# Patient Record
Sex: Male | Born: 1975 | Race: Black or African American | Hispanic: No | Smoking: Never smoker
Health system: Southern US, Community
[De-identification: ages and names within clinical notes are randomized; demographics above are authoritative.]

## PROBLEM LIST (undated history)

## (undated) DIAGNOSIS — K3184 Gastroparesis: Secondary | ICD-10-CM

## (undated) HISTORY — PX: CHOLECYSTECTOMY: SHX55

---

## 2003-01-23 ENCOUNTER — Encounter: Payer: Self-pay | Admitting: Emergency Medicine

## 2003-01-23 ENCOUNTER — Emergency Department (HOSPITAL_COMMUNITY): Admission: EM | Admit: 2003-01-23 | Discharge: 2003-01-23 | Payer: Self-pay | Admitting: Emergency Medicine

## 2005-03-17 ENCOUNTER — Emergency Department (HOSPITAL_COMMUNITY): Admission: EM | Admit: 2005-03-17 | Discharge: 2005-03-17 | Payer: Self-pay | Admitting: Emergency Medicine

## 2005-03-29 ENCOUNTER — Encounter: Admission: RE | Admit: 2005-03-29 | Discharge: 2005-04-15 | Payer: Self-pay | Admitting: Internal Medicine

## 2006-11-28 ENCOUNTER — Emergency Department (HOSPITAL_COMMUNITY): Admission: EM | Admit: 2006-11-28 | Discharge: 2006-11-28 | Payer: Self-pay | Admitting: Emergency Medicine

## 2010-10-23 NOTE — Consult Note (Signed)
NAME:  Carlos [name], Carlos [name]                  ACCOUNT NO.:  [id number]   MEDICAL RECORD NO.:  [id number]          PATIENT TYPE:  EMS   LOCATION:  MINO                         FACILITY:  [facility]   PHYSICIAN:  Rose Phi. Myna Hidalgo, M.D. DATE OF BIRTH:  06-28-1975   DATE OF CONSULTATION:  03/17/2005  DATE OF DISCHARGE:  03/17/2005                                   CONSULTATION   REASON FOR CONSULTATION:  Metastatic adenocarcinoma of lung primary.   HISTORY OF PRESENT ILLNESS:  Carlos [name] is a very nice 35 year old gentleman  who initially was admitted on March 01, 2005 for cervical diskectomy. He  was admitted by Dr. Otelia Sergeant. He underwent diskectomy from C4 through C7. He  had fusion with bone grafts. There was some internal fixation with cervical  plate and screws. Postoperatively, he had problems with dysphagia and left  shoulder weakness. He was seen by Dr. Sharene Skeans of neurology. He did have an  MRI. MRI was done of the brain, which was negative for any CNS lesions.  However, there was an abnormal appearance at the T2 vertebral body. There  was some thin epidural enhancement. Also noted was probable involvement of  T1 and T3. MRI of the left shoulder was done, which showed an endosteal  lesion of the proximal humerus. He did have a chest x-ray done on March 10, 2005, which showed prominence in the right hilum. There was some density in  the right upper lobe. A CT of the chest was done, which showed multiple  foreign nodules and masses. Largest mass in the right lower lobe measured  2.6 cm. There was also right hilar adenopathy. He subsequently underwent a  CT biopsy of a lung nodule. This unfortunately came back as adenocarcinoma.  The pathology report was [id number]. Stains were done, which showed the  staining pattern to support a lung primary. We were asked to see Carlos [name]  for evaluation. Of note, a bone scan was done which showed diffuse  metastatic disease of the skeleton. There was  involvement of the bilateral  femur and right humerus. He has lost quite a bit of weight. He has a Panda  tube in because of severe dysphagia. He is starting to swallow a little bit  better. He has no problems with bleeding. There is no cough or increased  shortness of breath.   PAST MEDICAL HISTORY:  1.  Hypertension.  2.  Chronic obstructive pulmonary disease.  3.  Anxiety/depression.   ALLERGIES:  None.   CURRENT MEDICATIONS:  Maxzide 37.5/25 1 p.o. q.d., Zyprexa 2.5 mg q.d.,  Prozac 20 mg q.d., Xanax 0.5 mg q.d., albuterol nebulizer 2.5 mg q.i.d.,  Protonix 40 mg q.d., Wellbutrin 150 mg p.o. q.d., Atrovent inhaler 0.5 mg  q.i.d., Reglan 10 mg p.o. q. 6 hours.   SOCIAL HISTORY:  Remarkable for significant tobacco use. He had worked at  [employer].   FAMILY HISTORY:  Noncontributory.   REVIEW OF SYSTEMS:  Is as stated in the history of present illness.   PHYSICAL EXAMINATION:  GENERAL:  This is a thin white  gentleman in no  obvious distress.  VITAL SIGNS:  Temperature 96.2, pulse 92, respiratory rate 24, blood  pressure 136/84.  HEENT:  Normocephalic and atraumatic skull. There is some slight temporal  muscle wasting. He has no oral lesions. He has a cervical collar on.  However, I could not palpate any obvious adenopathy.  LUNGS:  Some decreased breath sounds throughout both lung fields.  CARDIAC:  Regular rate and rhythm. Normal S1 and S2. No murmurs, rubs, or  bruits.  ABDOMEN:  Soft with good bowel sounds. There is no palpable abdominal mass.  There is no palpable hepatosplenomegaly.  EXTREMITIES:  Shows muscle wasting upper and lower extremities bilaterally.  NEUROLOGIC:  No focal neurological deficits.   LABORATORY DATA:  White blood cell count is 15.4. Hemoglobin and hematocrit  13.9 and 41.7. Platelet count 512,000. Sodium is 138, potassium 4.4, BUN 21,  creatinine 0.8, calcium 9.5. He does have a monoclonal IgG kappa protein  present. He has slightly  depressed total IgG level. He has a monoclonal  protein of 0.13 grams. His PSA is 1.4.   IMPRESSION/PLAN:  Carlos [name] is a 35 year old gentleman with stage 4  adenocarcinoma of the lung. He certainly is a candidate for chemotherapy.  His performance status is adequate  - ECOG (we certainly need to be careful  with our chemotherapy. I think he could tolerate carboplatin based therapy  but not cisplatin based therapy). I need to speak with radiation oncology to  make sure that he is not supine. His disease certainly is somewhat on the  aggressive side. I think his chance of responding is about 25% or so. I  think that carboplatin/Taxotere would be appropriate therapy for him. I  would not add Avastin, as I am not convinced that this provides that much  more benefit to him. If he does decide to proceed with chemotherapy this  admission, we can certainly move him over to [facility].  He may be interested in a second opinion. I told he and his wife that I  would prefer that he go to [facility], as I believe they have a very active lung  cancer program. Overall, I think the outlook for Carlos [name] is not going to  be too favorable. His weight loss is certainly an ominous prognostic factor.  His thrombocytosis also is a factor. We will certainly follow Carlos [name] as  we can.      Rose Phi. Myna Hidalgo, M.D.  Electronically Signed     PRE/MEDQ  D:  03/17/2005  T:  03/17/2005  Job:  [id number]   cc:   Deanna Artis. Sharene Skeans, M.D.  Fax: [phone]   Ladell Pier, M.D.  Fax: [phone]

## 2010-10-29 ENCOUNTER — Emergency Department (HOSPITAL_COMMUNITY)
Admission: EM | Admit: 2010-10-29 | Discharge: 2010-10-29 | Disposition: A | Discharge status: Home / Self Care | Payer: Self-pay | Attending: Emergency Medicine | Admitting: Emergency Medicine

## 2010-10-29 DIAGNOSIS — I10 Essential (primary) hypertension: Secondary | ICD-10-CM | POA: Insufficient documentation

## 2010-10-29 DIAGNOSIS — E119 Type 2 diabetes mellitus without complications: Secondary | ICD-10-CM | POA: Insufficient documentation

## 2010-10-29 DIAGNOSIS — L03319 Cellulitis of trunk, unspecified: Secondary | ICD-10-CM | POA: Insufficient documentation

## 2010-10-29 DIAGNOSIS — L02219 Cutaneous abscess of trunk, unspecified: Secondary | ICD-10-CM | POA: Insufficient documentation

## 2011-08-08 ENCOUNTER — Emergency Department (HOSPITAL_COMMUNITY)
Admission: EM | Admit: 2011-08-08 | Discharge: 2011-08-09 | Disposition: A | Discharge status: Home Health | Payer: BC Managed Care – PPO | Attending: Emergency Medicine | Admitting: Emergency Medicine

## 2011-08-08 ENCOUNTER — Encounter (HOSPITAL_COMMUNITY): Payer: Self-pay | Admitting: *Deleted

## 2011-08-08 ENCOUNTER — Other Ambulatory Visit: Payer: Self-pay

## 2011-08-08 DIAGNOSIS — M79609 Pain in unspecified limb: Secondary | ICD-10-CM | POA: Insufficient documentation

## 2011-08-08 DIAGNOSIS — M436 Torticollis: Secondary | ICD-10-CM

## 2011-08-08 DIAGNOSIS — Z79899 Other long term (current) drug therapy: Secondary | ICD-10-CM | POA: Insufficient documentation

## 2011-08-08 DIAGNOSIS — M542 Cervicalgia: Secondary | ICD-10-CM | POA: Insufficient documentation

## 2011-08-08 DIAGNOSIS — E119 Type 2 diabetes mellitus without complications: Secondary | ICD-10-CM | POA: Insufficient documentation

## 2011-08-08 DIAGNOSIS — M62838 Other muscle spasm: Secondary | ICD-10-CM | POA: Insufficient documentation

## 2011-08-08 MED ORDER — IBUPROFEN 800 MG PO TABS: 800.0000 mg | ORAL_TABLET | Freq: Three times a day (TID) | ORAL | Status: AC

## 2011-08-08 MED ORDER — KETOROLAC TROMETHAMINE 60 MG/2ML IM SOLN
60.0000 mg | Freq: Once | INTRAMUSCULAR | Status: AC
  Administered 2011-08-08: 60 mg via INTRAMUSCULAR
  Filled 2011-08-08: qty 2

## 2011-08-08 MED ORDER — DIAZEPAM 5 MG PO TABS: 5.0000 mg | ORAL_TABLET | Freq: Two times a day (BID) | ORAL | Status: AC

## 2011-08-08 MED ORDER — DIAZEPAM 5 MG PO TABS
5.0000 mg | ORAL_TABLET | Freq: Once | ORAL | Status: AC
  Administered 2011-08-08: 5 mg via ORAL
  Filled 2011-08-08: qty 1

## 2011-08-08 NOTE — ED Notes (Signed)
Pt presents with no acute distress- c/oof left sided neck pain x 1 month.  Sharp pain to left arm started to day- denies injury

## 2011-08-08 NOTE — Discharge Instructions (Signed)
Your neck pain is most likely related to muscle spasm. You've been given medication here to treat this. You'll additionally been given prescriptions for ibuprofen and a muscle rupture. Please take the ibuprofen as prescribed for at least the next 3 days. Return to the ER with worsening condition, pain, fever, rash, or any other worrisome symptoms.  Torticollis, Acute You have suddenly (acutely) developed a twisted neck (torticollis). This is usually a self-limited condition. CAUSES  Acute torticollis may be caused by malposition, trauma or infection. Most commonly, acute torticollis is caused by sleeping in an awkward position. Torticollis may also be caused by the flexion, extension or twisting of the neck muscles beyond their normal position. Sometimes, the exact cause may not be known. SYMPTOMS  Usually, there is pain and limited movement of the neck. Your neck may twist to one side. DIAGNOSIS  The diagnosis is often made by physical examination. X-rays, CT scans or MRIs may be done if there is a history of trauma or concern of infection. TREATMENT  For a common, stiff neck that develops during sleep, treatment is focused on relaxing the contracted neck muscle. Medications (including shots) may be used to treat the problem. Most cases resolve in several days. Torticollis usually responds to conservative physical therapy. If left untreated, the shortened and spastic neck muscle can cause deformities in the face and neck. Rarely, surgery is required. HOME CARE INSTRUCTIONS   Use over-the-counter and prescription medications as directed by your caregiver.   Do stretching exercises and massage the neck as directed by your caregiver.   Follow up with physical therapy if needed and as directed by your caregiver.  SEEK IMMEDIATE MEDICAL CARE IF:   You develop difficulty breathing or noisy breathing (stridor).   You drool, develop trouble swallowing or have pain with swallowing.   You develop  numbness or weakness in the hands or feet.   You have changes in speech or vision.   You have problems with urination or bowel movements.   You have difficulty walking.   You have a fever.   You have increased pain.  MAKE SURE YOU:   Understand these instructions.   Will watch your condition.   Will get help right away if you are not doing well or get worse.  Document Released: 05/21/2000 Document Revised: 05/13/2011 Document Reviewed: 07/02/2009 [facility] Patient Information 2012 [place], [place].

## 2011-08-09 NOTE — ED Provider Notes (Signed)
History     CSN: [id number]  Arrival date & time 08/08/11  2308   First MD Initiated Contact with Patient 08/08/11 2340      Chief Complaint  Patient presents with  . Neck Pain  . Arm Pain    (Consider location/radiation/quality/duration/timing/severity/associated sxs/prior treatment) Patient is a 36 y.o. male presenting with neck pain and arm pain. The history is provided by the patient.  Neck Pain   Arm Pain Associated symptoms include neck pain.  36 year old male with past medical history of diabetes presents with left-sided neck pain. He states that he awoke from sleep with this this morning. Pain is described as sharp and stabbing when the neck is flexed toward the opposite shoulder. He has had occasional burning pain in the area, which concerned him and prompted him to come to the ED for further evaluation. States the pain does not radiate into his shoulder down his arm. He denies any associated chest pain or shortness of breath or diaphoresis. Denies nausea, vomiting. Has never had anything like this before. No known trauma to the area or recent change in activity. Denies headache, numbness, weakness in the extremities, facial pain or weakness.  Past Medical History  Diagnosis Date  . Diabetes mellitus     History reviewed. No pertinent past surgical history.  No family history on file.  History  Substance Use Topics  . Smoking status: Never Smoker   . Smokeless tobacco: Not on file  . Alcohol Use: Yes      Review of Systems  HENT: Positive for neck pain.   All other systems reviewed and are negative.    Allergies  Review of patient's allergies indicates no known allergies.  Home Medications   Current Outpatient Rx  Name Route Sig Dispense Refill  . DIAZEPAM 5 MG PO TABS Oral Take 1 tablet (5 mg total) by mouth 2 (two) times daily. 10 tablet 0  . IBUPROFEN 800 MG PO TABS Oral Take 1 tablet (800 mg total) by mouth 3 (three) times daily. 21 tablet 0    BP  149/95  Pulse 84  Temp(Src) 98 F (36.7 C) (Oral)  Resp 20  SpO2 98%  Physical Exam  Nursing note and vitals reviewed. Constitutional: He is oriented to person, place, and time. He appears well-developed and well-nourished. No distress.  Neck: Neck supple. Muscular tenderness present. No spinous process tenderness present. No rigidity. Decreased range of motion present. No edema and no erythema present. No Brudzinski's sign and no Kernig's sign noted.         Palpable muscle spasm to L lateral neck. Pain worsens when flexing neck toward R shoulder.  Cardiovascular: Normal rate, regular rhythm and normal heart sounds.  Exam reveals no gallop and no friction rub.   No murmur heard. Pulmonary/Chest: Effort normal and breath sounds normal. He exhibits no tenderness.  Abdominal: Soft. There is no tenderness.  Musculoskeletal:       Grip strength equal bilaterally. FROM in all ext.  Neurological: He is alert and oriented to person, place, and time.  Skin: Skin is warm and dry. He is not diaphoretic.  Psychiatric: He has a normal mood and affect.    ED Course  Procedures (including critical care time)  Labs Reviewed - No data to display No results found.   1. Torticollis       MDM  35yo M with pain to L lateral neck which started this morning when he woke up - palpable muscle spasm and  tenderness on exam - will tx as torticollis. I don't have suspicion for atypical CP based on tenderness on exam and symptoms. No evidence of rash. No evidence for meningismus. Return precautions discussed.         Grant Fontana, [place] 08/09/11 [phone]

## 2011-08-09 NOTE — ED Provider Notes (Signed)
Medical screening examination/treatment/procedure(s) were performed by non-physician practitioner and as supervising physician I was immediately available for consultation/collaboration.  ED ECG REPORT   Date: 08/09/2011   Rate: 83  Rhythm: normal sinus rhythm  QRS Axis: normal  Intervals: normal  ST/T Wave abnormalities: normal  Conduction Disutrbances:none  Narrative Interpretation:   Old EKG Reviewed: none available    Vida Roller, MD 08/09/11 [phone]

## 2013-06-29 ENCOUNTER — Ambulatory Visit: Payer: BC Managed Care – PPO

## 2015-02-28 ENCOUNTER — Encounter (HOSPITAL_COMMUNITY): Payer: Self-pay | Admitting: *Deleted

## 2015-02-28 ENCOUNTER — Emergency Department (HOSPITAL_COMMUNITY)
Admission: EM | Admit: 2015-02-28 | Discharge: 2015-02-28 | Disposition: A | Discharge status: Home / Self Care | Payer: 59 | Attending: Emergency Medicine | Admitting: Emergency Medicine

## 2015-02-28 DIAGNOSIS — Z79899 Other long term (current) drug therapy: Secondary | ICD-10-CM | POA: Diagnosis not present

## 2015-02-28 DIAGNOSIS — E119 Type 2 diabetes mellitus without complications: Secondary | ICD-10-CM | POA: Insufficient documentation

## 2015-02-28 DIAGNOSIS — L0291 Cutaneous abscess, unspecified: Secondary | ICD-10-CM

## 2015-02-28 DIAGNOSIS — L02416 Cutaneous abscess of left lower limb: Secondary | ICD-10-CM | POA: Diagnosis not present

## 2015-02-28 LAB — CBG MONITORING, ED: Glucose-Capillary: 307 mg/dL — ABNORMAL HIGH (ref 65–99)

## 2015-02-28 MED ORDER — SULFAMETHOXAZOLE-TRIMETHOPRIM 800-160 MG PO TABS
1.0000 | ORAL_TABLET | Freq: Once | ORAL | Status: AC
  Administered 2015-02-28: 1 via ORAL
  Filled 2015-02-28: qty 1

## 2015-02-28 MED ORDER — LIDOCAINE-EPINEPHRINE (PF) 2 %-1:200000 IJ SOLN
20.0000 mL | Freq: Once | INTRAMUSCULAR | Status: DC
  Filled 2015-02-28: qty 20

## 2015-02-28 MED ORDER — SULFAMETHOXAZOLE-TRIMETHOPRIM 800-160 MG PO TABS
1.0000 | ORAL_TABLET | Freq: Two times a day (BID) | ORAL | Status: AC
Start: 2015-02-28 — End: 2015-03-07

## 2015-02-28 NOTE — Progress Notes (Signed)
Patient listed as not having insurance or a pcp.  Patient confirms his pcp is located at [facility]  [phone].  System updated.

## 2015-02-28 NOTE — ED Notes (Signed)
Pt states that he has a wound on his left leg that began last week; pt states that he thought it was a spider bite; pt states that he has been trying to treat it at home and it has gotten worse; pt with quarter sized red open area to left calf

## 2015-02-28 NOTE — ED Notes (Signed)
CBG 307 

## 2015-02-28 NOTE — Discharge Instructions (Signed)

## 2015-02-28 NOTE — ED Provider Notes (Signed)
CSN: [id number]     Arrival date & time 02/28/15  2132 History   First MD Initiated Contact with Patient 02/28/15 2145     Chief Complaint  Patient presents with  . Abscess      HPI Patient ports abscess and drainage of his left lower extremity.  He states several days ago he developed an area of swelling with spreading redness.  His girlfriend incised the abscess and pus was expressed.  He continues having discomfort and pain and erythema and thus presents to the emergency department for evaluation.  No fevers or chills.  He is a diabetic.  Is compliant with his medications.  No known trauma to his leg.   Past Medical History  Diagnosis Date  . Diabetes mellitus    History reviewed. No pertinent past surgical history. No family history on file. Social History  Substance Use Topics  . Smoking status: Never Smoker   . Smokeless tobacco: None  . Alcohol Use: Yes     Comment: rarely    Review of Systems  All other systems reviewed and are negative.     Allergies  Review of patient's allergies indicates no known allergies.  Home Medications   Prior to Admission medications   Medication Sig Start Date End Date Taking? Authorizing Provider  metFORMIN (GLUCOPHAGE) 500 MG tablet Take 500 mg by mouth 2 (two) times daily with a meal.   Yes Historical Provider, MD  sulfamethoxazole-trimethoprim (BACTRIM DS,SEPTRA DS) 800-160 MG per tablet Take 1 tablet by mouth 2 (two) times daily. 02/28/15 03/07/15  Azalia Bilis, MD   BP 138/90 mmHg  Pulse 62  Temp(Src) 98.8 F (37.1 C) (Oral)  Resp 18  SpO2 97% Physical Exam  Constitutional: He is oriented to person, place, and time. He appears well-developed and well-nourished.  HENT:  Head: Normocephalic.  Eyes: EOM are normal.  Neck: Normal range of motion.  Pulmonary/Chest: Effort normal.  Abdominal: He exhibits no distension.  Musculoskeletal: Normal range of motion.  Left lower extremity abscess with small surrounding erythema of  the left anteromedial mid tibia  Neurological: He is alert and oriented to person, place, and time.  Psychiatric: He has a normal mood and affect.  Nursing note and vitals reviewed.   ED Course  Procedures (including critical care time)  INCISION AND DRAINAGE Performed by: Lyanne Co Consent: Verbal consent obtained. Risks and benefits: risks, benefits and alternatives were discussed Time out performed prior to procedure Type: abscess Body area: left lower extremity Anesthesia: local infiltration Incision was made with a scalpel. Local anesthetic: lidocaine 2% with epinephrine Anesthetic total: 5 ml Complexity: complex Blunt dissection to break up loculations Drainage: purulent Drainage amount: small Packing material: simple Patient tolerance: Patient tolerated the procedure well with no immediate complications.     Labs Review Labs Reviewed  CBG MONITORING, ED - Abnormal; Notable for the following:    Glucose-Capillary 307 (*)    All other components within normal limits    Imaging Review No results found. I have personally reviewed and evaluated these images and lab results as part of my medical decision-making.   EKG Interpretation None      MDM   Final diagnoses:  Abscess    Incision and drainage of abscess.  History of diabetes.  Patient be started on antibiotics.  Small surrounding cellulitis.  Infection warnings given.    Azalia Bilis, MD 02/28/15 [phone]

## 2015-05-01 ENCOUNTER — Emergency Department (HOSPITAL_COMMUNITY)
Admission: EM | Admit: 2015-05-01 | Discharge: 2015-05-01 | Disposition: A | Discharge status: Home / Self Care | Payer: 59 | Attending: Emergency Medicine | Admitting: Emergency Medicine

## 2015-05-01 ENCOUNTER — Encounter (HOSPITAL_COMMUNITY): Payer: Self-pay | Admitting: Emergency Medicine

## 2015-05-01 DIAGNOSIS — L02821 Furuncle of head [any part, except face]: Secondary | ICD-10-CM | POA: Insufficient documentation

## 2015-05-01 DIAGNOSIS — L0292 Furuncle, unspecified: Secondary | ICD-10-CM

## 2015-05-01 DIAGNOSIS — R220 Localized swelling, mass and lump, head: Secondary | ICD-10-CM | POA: Diagnosis present

## 2015-05-01 DIAGNOSIS — E119 Type 2 diabetes mellitus without complications: Secondary | ICD-10-CM | POA: Insufficient documentation

## 2015-05-01 DIAGNOSIS — Z79899 Other long term (current) drug therapy: Secondary | ICD-10-CM | POA: Insufficient documentation

## 2015-05-01 MED ORDER — DOXYCYCLINE HYCLATE 100 MG PO CAPS: 100.0000 mg | ORAL_CAPSULE | Freq: Two times a day (BID) | ORAL | Status: DC

## 2015-05-01 NOTE — Discharge Instructions (Signed)

## 2015-05-01 NOTE — ED Notes (Signed)
Pt c/o moveable mass to posterior neck onset one week ago, c/o headache onset after mass. Mild scabbing to exterior of mass, no signs of drainage, pt denies drainage. Pt reports hx of the same, states dx was boil with previous mass. Pt denies other symptoms.

## 2015-05-01 NOTE — ED Provider Notes (Signed)
CSN: [id number]     Arrival date & time 05/01/15  [id number] History   First MD Initiated Contact with Patient 05/01/15 [phone]     Chief Complaint  Patient presents with  . Mass     (Consider location/radiation/quality/duration/timing/severity/associated sxs/prior Treatment) HPI Comments: Pt here with possible abscess to occipital scalp x 1 week No fever or chills No drainage H/o same Nothing makes sx better or worse, no tx used pta  The history is provided by the patient.    Past Medical History  Diagnosis Date  . Diabetes mellitus    History reviewed. No pertinent past surgical history. History reviewed. No pertinent family history. Social History  Substance Use Topics  . Smoking status: Never Smoker   . Smokeless tobacco: None  . Alcohol Use: Yes     Comment: rarely    Review of Systems  All other systems reviewed and are negative.     Allergies  Review of patient's allergies indicates no known allergies.  Home Medications   Prior to Admission medications   Medication Sig Start Date End Date Taking? Authorizing Provider  doxycycline (VIBRAMYCIN) 100 MG capsule Take 1 capsule (100 mg total) by mouth 2 (two) times daily. 05/01/15   Lorre NickAnthony Brisia Schuermann, MD  metFORMIN (GLUCOPHAGE) 500 MG tablet Take 500 mg by mouth 2 (two) times daily with a meal.    Historical Provider, MD   BP 146/86 mmHg  Pulse 97  Temp(Src) 97.7 F (36.5 C) (Oral)  Resp 18  SpO2 100% Physical Exam  Constitutional: He is oriented to person, place, and time. He appears well-developed and well-nourished.  Non-toxic appearance. No distress.  HENT:  Head: Normocephalic and atraumatic.    Eyes: Conjunctivae, EOM and lids are normal. Pupils are equal, round, and reactive to light.  Neck: Normal range of motion. Neck supple. No tracheal deviation present. No thyroid mass present.  Cardiovascular: Normal rate, regular rhythm and normal heart sounds.  Exam reveals no gallop.   No murmur  heard. Pulmonary/Chest: Effort normal and breath sounds normal. No stridor. No respiratory distress. He has no decreased breath sounds. He has no wheezes. He has no rhonchi. He has no rales.  Abdominal: Soft. Normal appearance and bowel sounds are normal. He exhibits no distension. There is no tenderness. There is no rebound and no CVA tenderness.  Musculoskeletal: Normal range of motion. He exhibits no edema or tenderness.  Neurological: He is alert and oriented to person, place, and time. He has normal strength. No cranial nerve deficit or sensory deficit. GCS eye subscore is 4. GCS verbal subscore is 5. GCS motor subscore is 6.  Skin: Skin is warm and dry. No abrasion and no rash noted.  Psychiatric: He has a normal mood and affect. His speech is normal and behavior is normal.  Nursing note and vitals reviewed.   ED Course  Procedures (including critical care time) Labs Review Labs Reviewed - No data to display  Imaging Review No results found. I have personally reviewed and evaluated these images and lab results as part of my medical decision-making.   EKG Interpretation None      MDM   Final diagnoses:  Boil   No drainable abscess, possible early cellulitis, will tx with doxy    Lorre NickAnthony Joanthan Hlavacek, MD 05/01/15 [phone]

## 2021-04-23 ENCOUNTER — Other Ambulatory Visit: Payer: Self-pay

## 2021-04-23 ENCOUNTER — Encounter (HOSPITAL_COMMUNITY): Payer: Self-pay

## 2021-04-23 ENCOUNTER — Emergency Department (HOSPITAL_COMMUNITY): Payer: 59

## 2021-04-23 ENCOUNTER — Emergency Department (HOSPITAL_COMMUNITY)
Admission: EM | Admit: 2021-04-23 | Discharge: 2021-04-23 | Disposition: A | Discharge status: Home / Self Care | Payer: 59 | Attending: Emergency Medicine | Admitting: Emergency Medicine

## 2021-04-23 DIAGNOSIS — E119 Type 2 diabetes mellitus without complications: Secondary | ICD-10-CM | POA: Insufficient documentation

## 2021-04-23 DIAGNOSIS — Z7984 Long term (current) use of oral hypoglycemic drugs: Secondary | ICD-10-CM | POA: Insufficient documentation

## 2021-04-23 DIAGNOSIS — R0602 Shortness of breath: Secondary | ICD-10-CM | POA: Insufficient documentation

## 2021-04-23 DIAGNOSIS — R112 Nausea with vomiting, unspecified: Secondary | ICD-10-CM | POA: Diagnosis not present

## 2021-04-23 DIAGNOSIS — K1379 Other lesions of oral mucosa: Secondary | ICD-10-CM

## 2021-04-23 DIAGNOSIS — R609 Edema, unspecified: Secondary | ICD-10-CM | POA: Diagnosis not present

## 2021-04-23 DIAGNOSIS — R059 Cough, unspecified: Secondary | ICD-10-CM | POA: Insufficient documentation

## 2021-04-23 DIAGNOSIS — R000 Tachycardia, unspecified: Secondary | ICD-10-CM | POA: Insufficient documentation

## 2021-04-23 LAB — CBC WITH DIFFERENTIAL/PLATELET
Abs Immature Granulocytes: 0.04 10*3/uL (ref 0.00–0.07)
Basophils Absolute: 0 10*3/uL (ref 0.0–0.1)
Basophils Relative: 0 %
Eosinophils Absolute: 0 10*3/uL (ref 0.0–0.5)
Eosinophils Relative: 0 %
HCT: 44.6 % (ref 39.0–52.0)
Hemoglobin: 15.4 g/dL (ref 13.0–17.0)
Immature Granulocytes: 1 %
Lymphocytes Relative: 21 %
Lymphs Abs: 1.7 10*3/uL (ref 0.7–4.0)
MCH: 30.9 pg (ref 26.0–34.0)
MCHC: 34.5 g/dL (ref 30.0–36.0)
MCV: 89.6 fL (ref 80.0–100.0)
Monocytes Absolute: 0.8 10*3/uL (ref 0.1–1.0)
Monocytes Relative: 9 %
Neutro Abs: 5.7 10*3/uL (ref 1.7–7.7)
Neutrophils Relative %: 69 %
Platelets: 238 10*3/uL (ref 150–400)
RBC: 4.98 MIL/uL (ref 4.22–5.81)
RDW: 12.1 % (ref 11.5–15.5)
WBC: 8.3 10*3/uL (ref 4.0–10.5)
nRBC: 0 % (ref 0.0–0.2)

## 2021-04-23 LAB — COMPREHENSIVE METABOLIC PANEL
ALT: 16 U/L (ref 0–44)
AST: 19 U/L (ref 15–41)
Albumin: 4.4 g/dL (ref 3.5–5.0)
Alkaline Phosphatase: 45 U/L (ref 38–126)
Anion gap: 11 (ref 5–15)
BUN: 21 mg/dL — ABNORMAL HIGH (ref 6–20)
CO2: 29 mmol/L (ref 22–32)
Calcium: 9.3 mg/dL (ref 8.9–10.3)
Chloride: 96 mmol/L — ABNORMAL LOW (ref 98–111)
Creatinine, Ser: 1.24 mg/dL (ref 0.61–1.24)
GFR, Estimated: >60 mL/min (ref >60)
Glucose, Bld: 229 mg/dL — ABNORMAL HIGH (ref 70–99)
Potassium: 4.2 mmol/L (ref 3.5–5.1)
Sodium: 136 mmol/L (ref 135–145)
Total Bilirubin: 1.4 mg/dL — ABNORMAL HIGH (ref 0.3–1.2)
Total Protein: 7.5 g/dL (ref 6.5–8.1)

## 2021-04-23 LAB — LIPASE, BLOOD: Lipase: 21 U/L (ref 11–51)

## 2021-04-23 MED ORDER — IOHEXOL 350 MG/ML SOLN
60.0000 mL | Freq: Once | INTRAVENOUS | Status: AC | PRN
  Administered 2021-04-23: 21:00:00 60 mL via INTRAVENOUS

## 2021-04-23 MED ORDER — SODIUM CHLORIDE 0.9 % IV BOLUS
1000.0000 mL | Freq: Once | INTRAVENOUS | Status: AC
  Administered 2021-04-23: 20:00:00 1000 mL via INTRAVENOUS

## 2021-04-23 NOTE — ED Triage Notes (Signed)
Pt. Arrived POV c/o cough. Pt. States that he coughed up something that feels as if it is stuck in his throat. Denies trouble breathing.

## 2021-04-23 NOTE — Discharge Instructions (Signed)
The back of your mouth looks a little swollen.  Does not look like infection.  Otherwise the scan was reassuring.  Follow-up with your doctor.

## 2021-04-23 NOTE — ED Provider Notes (Signed)
[facility]-EMERGENCY DEPT Provider Note   CSN: [id number] Arrival date & time: 04/23/21  1820     History Chief Complaint  Patient presents with   Cough    Carlos [name] is a 45 y.o. male.   Cough Associated symptoms: shortness of breath   Associated symptoms: no chest pain, no rash and no sore throat   Patient presents with cough and feeling of fullness in his throat.  States has had nausea and vomiting for the last few days.  This is not unusual for him with his gastroparesis.  States he actually feels of his belly is doing a little better now.  However feels as if there is something in his throat.  States when he had coughed or vomited and felt as if something got stuck on the left side of his throat.  States he feels more short of breath if he lays down now.  States he also feels if he could have a urinary tract infection.  States his sugars have been doing pretty well.  States they were around 170.  Feels as if he is little dehydrated.    Past Medical History:  Diagnosis Date   Diabetes mellitus   Gastroparesis.  There are no problems to display for this patient.   History reviewed. No pertinent surgical history.     History reviewed. No pertinent family history.  Social History   Tobacco Use   Smoking status: Never  Substance Use Topics   Alcohol use: Yes    Comment: rarely   Drug use: No    Home Medications Prior to Admission medications   Medication Sig Start Date End Date Taking? Authorizing Provider  doxycycline (VIBRAMYCIN) 100 MG capsule Take 1 capsule (100 mg total) by mouth 2 (two) times daily. 05/01/15   Lorre Nick, MD  metFORMIN (GLUCOPHAGE) 500 MG tablet Take 500 mg by mouth 2 (two) times daily with a meal.    [provider]    Allergies    Patient has no known allergies.  Review of Systems   Review of Systems  Constitutional:  Positive for appetite change.  HENT:  Negative for sore throat and trouble  swallowing.        Pain in left throat and feeling of fullness or swelling.  Respiratory:  Positive for cough and shortness of breath.   Cardiovascular:  Negative for chest pain.  Gastrointestinal:  Positive for nausea and vomiting.  Genitourinary:  Negative for flank pain.  Musculoskeletal:  Negative for back pain.  Skin:  Negative for rash.  Neurological:  Negative for weakness.  Psychiatric/Behavioral:  Negative for confusion.    Physical Exam Updated Vital Signs BP (!) 171/98 (BP Location: Right Arm)   Pulse (!) 109   Temp 98.4 F (36.9 C) (Oral)   Resp 19   Ht 5\' 9"  (1.753 m)   Wt 95.3 kg   SpO2 97%   BMI 31.01 kg/m   Physical Exam Vitals and nursing note reviewed.  HENT:     Head: Atraumatic.     Mouth/Throat:      Comments: Edema of posterior soft palate and uvula.  Also some lateral edema. Eyes:     Pupils: Pupils are equal, round, and reactive to light.  Cardiovascular:     Rate and Rhythm: Tachycardia present.  Pulmonary:     Breath sounds: No wheezing or rhonchi.  Abdominal:     Tenderness: There is no abdominal tenderness.  Musculoskeletal:  General: No tenderness.     Cervical back: Neck supple.  Skin:    General: Skin is warm.     Capillary Refill: Capillary refill takes less than 2 seconds.  Neurological:     Mental Status: He is alert and oriented to person, place, and time.  Psychiatric:        Mood and Affect: Mood normal.    ED Results / Procedures / Treatments   Labs (all labs ordered are listed, but only abnormal results are displayed) Labs Reviewed  COMPREHENSIVE METABOLIC PANEL - Abnormal; Notable for the following components:      Result Value   Chloride 96 (*)    Glucose, Bld 229 (*)    BUN 21 (*)    Total Bilirubin 1.4 (*)    All other components within normal limits  LIPASE, BLOOD  CBC WITH DIFFERENTIAL/PLATELET  URINALYSIS, ROUTINE W REFLEX MICROSCOPIC    EKG None  Radiology CT Soft Tissue Neck W  Contrast  Result Date: 04/23/2021 CLINICAL DATA:  Sore throat EXAM: CT NECK WITH CONTRAST TECHNIQUE: Multidetector CT imaging of the neck was performed using the standard protocol following the bolus administration of intravenous contrast. CONTRAST:  107mL OMNIPAQUE IOHEXOL 350 MG/ML SOLN COMPARISON:  None. FINDINGS: PHARYNX AND LARYNX: The nasopharynx, oropharynx and larynx are normal. Visible portions of the oral cavity, tongue base and floor of mouth are normal. Normal epiglottis, vallecula and pyriform sinuses. The larynx is normal. No retropharyngeal abscess, effusion or lymphadenopathy. SALIVARY GLANDS: Normal parotid, submandibular and sublingual glands. THYROID: Normal. LYMPH NODES: No enlarged or abnormal density lymph nodes. VASCULAR: Major cervical vessels are patent. LIMITED INTRACRANIAL: Normal. VISUALIZED ORBITS: Normal. MASTOIDS AND VISUALIZED PARANASAL SINUSES: No fluid levels or advanced mucosal thickening. No mastoid effusion. SKELETON: No bony spinal canal stenosis. No lytic or blastic lesions. UPPER CHEST: Clear. OTHER: None. IMPRESSION: Normal CT of the neck. Electronically Signed   By: Deatra Robinson M.D.   On: 04/23/2021 21:55   DG Chest Portable 1 View  Result Date: 04/23/2021 CLINICAL DATA:  Cough. EXAM: PORTABLE CHEST 1 VIEW COMPARISON:  None. FINDINGS: The heart size and mediastinal contours are within normal limits. Both lungs are clear. The visualized skeletal structures are unremarkable. IMPRESSION: No active disease. Electronically Signed   By: Elgie Collard M.D.   On: 04/23/2021 20:29    Procedures Procedures   Medications Ordered in ED Medications  sodium chloride 0.9 % bolus 1,000 mL (0 mLs Intravenous Stopped 04/23/21 2254)  iohexol (OMNIPAQUE) 350 MG/ML injection 60 mL (60 mLs Intravenous Contrast Given 04/23/21 2122)    ED Course  I have reviewed the triage vital signs and the nursing notes.  Pertinent labs & imaging results that were available during my  care of the patient were reviewed by me and considered in my medical decision making (see chart for details).    MDM Rules/Calculators/A&P                           Patient with feeling of something in his throat.  Does have some posterior edema.  Appears to be mostly on soft palate and uvula.  CT scan done and read as negative.  No airway obstruction.  Discussed with patient about possible treatment such as steroids for this.  However after we have discussed would not want to worsen either his gastroparesis or his sugars.  Will discharge home and will follow up with PCP as needed.  Infection felt less  likely because Final Clinical Impression(s) / ED Diagnoses Final diagnoses:  Uvular edema    Rx / DC Orders ED Discharge Orders     None        Benjiman Core, MD 04/24/21 0002

## 2021-08-17 ENCOUNTER — Encounter (HOSPITAL_COMMUNITY): Payer: Self-pay

## 2021-08-17 ENCOUNTER — Emergency Department (HOSPITAL_COMMUNITY): Payer: 59

## 2021-08-17 ENCOUNTER — Emergency Department (HOSPITAL_COMMUNITY)
Admission: EM | Admit: 2021-08-17 | Discharge: 2021-08-17 | Disposition: A | Discharge status: Home / Self Care | Payer: 59 | Attending: Emergency Medicine | Admitting: Emergency Medicine

## 2021-08-17 ENCOUNTER — Other Ambulatory Visit: Payer: Self-pay

## 2021-08-17 DIAGNOSIS — R000 Tachycardia, unspecified: Secondary | ICD-10-CM | POA: Diagnosis not present

## 2021-08-17 DIAGNOSIS — E119 Type 2 diabetes mellitus without complications: Secondary | ICD-10-CM | POA: Diagnosis not present

## 2021-08-17 DIAGNOSIS — R112 Nausea with vomiting, unspecified: Secondary | ICD-10-CM | POA: Insufficient documentation

## 2021-08-17 DIAGNOSIS — R1012 Left upper quadrant pain: Secondary | ICD-10-CM | POA: Insufficient documentation

## 2021-08-17 DIAGNOSIS — R079 Chest pain, unspecified: Secondary | ICD-10-CM | POA: Insufficient documentation

## 2021-08-17 LAB — COMPREHENSIVE METABOLIC PANEL
ALT: 15 U/L (ref 0–44)
AST: 17 U/L (ref 15–41)
Albumin: 4.4 g/dL (ref 3.5–5.0)
Alkaline Phosphatase: 42 U/L (ref 38–126)
Anion gap: 12 (ref 5–15)
BUN: 14 mg/dL (ref 6–20)
CO2: 26 mmol/L (ref 22–32)
Calcium: 9.4 mg/dL (ref 8.9–10.3)
Chloride: 98 mmol/L (ref 98–111)
Creatinine, Ser: 0.99 mg/dL (ref 0.61–1.24)
GFR, Estimated: >60 mL/min (ref >60)
Glucose, Bld: 165 mg/dL — ABNORMAL HIGH (ref 70–99)
Potassium: 3.5 mmol/L (ref 3.5–5.1)
Sodium: 136 mmol/L (ref 135–145)
Total Bilirubin: 1.1 mg/dL (ref 0.3–1.2)
Total Protein: 7.4 g/dL (ref 6.5–8.1)

## 2021-08-17 LAB — TROPONIN I (HIGH SENSITIVITY): Troponin I (High Sensitivity): 5 ng/L (ref <18)

## 2021-08-17 LAB — CBC WITH DIFFERENTIAL/PLATELET
Abs Immature Granulocytes: 0.04 10*3/uL (ref 0.00–0.07)
Basophils Absolute: 0 10*3/uL (ref 0.0–0.1)
Basophils Relative: 1 %
Eosinophils Absolute: 0 10*3/uL (ref 0.0–0.5)
Eosinophils Relative: 0 %
HCT: 39.9 % (ref 39.0–52.0)
Hemoglobin: 14.2 g/dL (ref 13.0–17.0)
Immature Granulocytes: 1 %
Lymphocytes Relative: 34 %
Lymphs Abs: 2.1 10*3/uL (ref 0.7–4.0)
MCH: 30.7 pg (ref 26.0–34.0)
MCHC: 35.6 g/dL (ref 30.0–36.0)
MCV: 86.2 fL (ref 80.0–100.0)
Monocytes Absolute: 0.7 10*3/uL (ref 0.1–1.0)
Monocytes Relative: 11 %
Neutro Abs: 3.3 10*3/uL (ref 1.7–7.7)
Neutrophils Relative %: 53 %
Platelets: 286 10*3/uL (ref 150–400)
RBC: 4.63 MIL/uL (ref 4.22–5.81)
RDW: 12.6 % (ref 11.5–15.5)
WBC: 6.1 10*3/uL (ref 4.0–10.5)
nRBC: 0 % (ref 0.0–0.2)

## 2021-08-17 LAB — LIPASE, BLOOD: Lipase: 23 U/L (ref 11–51)

## 2021-08-17 MED ORDER — SODIUM CHLORIDE 0.9 % IV BOLUS
1000.0000 mL | Freq: Once | INTRAVENOUS | Status: AC
  Administered 2021-08-17: 1000 mL via INTRAVENOUS

## 2021-08-17 MED ORDER — FAMOTIDINE IN NACL 20-0.9 MG/50ML-% IV SOLN
20.0000 mg | Freq: Once | INTRAVENOUS | Status: AC
  Administered 2021-08-17: 20 mg via INTRAVENOUS
  Filled 2021-08-17: qty 50

## 2021-08-17 MED ORDER — METOCLOPRAMIDE HCL 5 MG/ML IJ SOLN
10.0000 mg | Freq: Once | INTRAMUSCULAR | Status: DC
  Filled 2021-08-17: qty 2

## 2021-08-17 MED ORDER — HALOPERIDOL LACTATE 5 MG/ML IJ SOLN
5.0000 mg | Freq: Once | INTRAMUSCULAR | Status: AC
  Administered 2021-08-17: 5 mg via INTRAVENOUS
  Filled 2021-08-17: qty 1

## 2021-08-17 MED ORDER — HYDROMORPHONE HCL 1 MG/ML IJ SOLN
1.0000 mg | Freq: Once | INTRAMUSCULAR | Status: AC
  Administered 2021-08-17: 1 mg via INTRAVENOUS
  Filled 2021-08-17: qty 1

## 2021-08-17 NOTE — ED Notes (Signed)
Patient given cup of water.

## 2021-08-17 NOTE — ED Triage Notes (Signed)
Patient c/o abdominal pain for 3 days. Patient states hx of abdominal pain. N/V and chest pains for 3 days. ?

## 2021-08-17 NOTE — Discharge Instructions (Signed)
You were seen today for nausea, vomiting, and abdominal pain. Your workup shows no signs of infection. Unfortunately you are unable to tolerate the majority of medications that I can offer. I recommend follow up with your GI provider for further evaluation. This is likely a worsening of symptoms from your peptic ulcer disease and gastroparesis symptoms. Return to the emergency department if you develop chest pain, shortness of breath, or other life threatening symptoms.  ?

## 2021-08-17 NOTE — ED Notes (Signed)
Urinal at bedside.  

## 2021-08-17 NOTE — ED Provider Notes (Cosign Needed)
[facility]-EMERGENCY DEPT Provider Note   CSN: [id number] Arrival date & time: 08/17/21  0854     History  No chief complaint on file.   Carlos [name] is a 46 y.o. male.  Patient presents to the emergency department complaining of nausea, vomiting, and chest pain for the past three days. Patient states he has been unable to keep anything down since Friday. Patient has taken no medications at home saying that nothing helps. The patient has PMH significant for delayed gastric emptying, gastroparesis, DM, h.pylori, esophageal ulcers. Patient had planned upper GI endoscopy planned for February but had to cancel due to illness.   HPI     Home Medications Prior to Admission medications   Not on File      Allergies    Patient has no known allergies.    Review of Systems   Review of Systems  Constitutional:  Negative for fever.  Respiratory:  Negative for cough, shortness of breath and stridor.   Cardiovascular:  Positive for chest pain.       Described as burning  Gastrointestinal:  Positive for abdominal pain, constipation (Mild), nausea and vomiting. Negative for diarrhea.  Genitourinary:  Negative for dysuria and flank pain.  Skin:  Negative for pallor.   Physical Exam Updated Vital Signs BP (!) 160/96    Pulse (!) 101    Temp 98.1 F (36.7 C) (Oral)    Resp 15    Ht 5\' 9"  (1.753 m)    Wt 88.5 kg    SpO2 99%    BMI 28.80 kg/m  Physical Exam Vitals and nursing note reviewed.  Constitutional:      General: He is in acute distress.  HENT:     Head: Normocephalic and atraumatic.  Eyes:     Conjunctiva/sclera: Conjunctivae normal.  Cardiovascular:     Rate and Rhythm: Regular rhythm. Tachycardia present.     Pulses: Normal pulses.     Heart sounds: Normal heart sounds.  Pulmonary:     Effort: Pulmonary effort is normal.     Breath sounds: Normal breath sounds.  Abdominal:     Palpations: Abdomen is soft.     Tenderness: There is abdominal tenderness  (Mild tenderness to palpation LUQ). There is no right CVA tenderness or left CVA tenderness.  Musculoskeletal:     Cervical back: Normal range of motion.  Skin:    General: Skin is warm and dry.  Neurological:     Mental Status: He is alert.    ED Results / Procedures / Treatments   Labs (all labs ordered are listed, but only abnormal results are displayed) Labs Reviewed  COMPREHENSIVE METABOLIC PANEL - Abnormal; Notable for the following components:      Result Value   Glucose, Bld 165 (*)    All other components within normal limits  CBC WITH DIFFERENTIAL/PLATELET  LIPASE, BLOOD  URINALYSIS, ROUTINE W REFLEX MICROSCOPIC  TROPONIN I (HIGH SENSITIVITY)  TROPONIN I (HIGH SENSITIVITY)    EKG EKG Interpretation  Date/Time:  Monday August 17 2021 09:05:17 EDT Ventricular Rate:  110 PR Interval:  134 QRS Duration: 85 QT Interval:  316 QTC Calculation: 428 R Axis:   75 Text Interpretation: Sinus tachycardia ST elev, probable normal early repol pattern No acute changes No significant change since last tracing Confirmed by [id number] [phone]) on 08/17/2021 1:09:25 PM  Radiology DG Chest 2 View  Result Date: 08/17/2021 CLINICAL DATA:  Abdominal pain for 3D days in a 46 year old male.  EXAM: CHEST - 2 VIEW COMPARISON:  Comparison made with April 23, 2021. FINDINGS: EKG leads project over the chest. Cardiomediastinal contours and hilar structures are normal. Lungs are clear. No sign of effusion. No visible pneumothorax. On limited assessment there is no acute skeletal process. IMPRESSION: No acute cardiopulmonary disease. Electronically Signed   By: Donzetta KohutGeoffrey  Wile M.D.   On: 08/17/2021 10:45    Procedures Procedures    Medications Ordered in ED Medications  metoCLOPramide (REGLAN) injection 10 mg (10 mg Intravenous Not Given 08/17/21 1000)  sodium chloride 0.9 % bolus 1,000 mL (0 mLs Intravenous Stopped 08/17/21 1326)  famotidine (PEPCID) IVPB 20 mg premix (0 mg Intravenous  Stopped 08/17/21 1038)  HYDROmorphone (DILAUDID) injection 1 mg (1 mg Intravenous Given 08/17/21 1019)  haloperidol lactate (HALDOL) injection 5 mg (5 mg Intravenous Given 08/17/21 1324)    ED Course/ Medical Decision Making/ A&P                           Medical Decision Making Amount and/or Complexity of Data Reviewed Labs: ordered. Radiology: ordered.  Risk Prescription drug management.   This patient presents to the ED for concern of nausea, vomiting, and chest pain, this involves an extensive number of treatment options, and is a complaint that carries with it a high risk of complications and morbidity.  The differential diagnosis includes gastroparesis, PUD, gastritis, pneumonia, pancreatitis, ACS, and others   Co morbidities that complicate the patient evaluation  Hx gastroparesis, PUD, severely delayed gastric emptying   Additional history obtained:  External records from outside source obtained and reviewed including Care Everywhere records showing previous CT abdomen pelvis and gastric emptying study. ED note from 01/16/20   Lab Tests:  I Ordered, and personally interpreted labs.  The pertinent results include:  Lipase 23, Troponin 5   Imaging Studies ordered:  I ordered imaging studies including chest x-ray  I independently visualized and interpreted imaging which showed no acute disease I agree with the radiologist interpretation   Cardiac Monitoring:  The patient was maintained on a cardiac monitor.  I personally viewed and interpreted the cardiac monitored which showed an underlying rhythm of: sinus tachycardia   Medicines ordered and prescription drug management:  I ordered medication including reglan and haldol for gastroparesis symptoms, pepcid for PUD symptoms, dilaudid for pain.  Patient refused reglan, refused GI cocktail, refused any nausea medication Reevaluation of the patient after these medicines showed that the patient improved I have reviewed  the patients home medicines and have made adjustments as needed   Test Considered:  CT abdomen/pelvis  The patient's pain improved with pain medication, haldol, and pepcid. The patient does not have concerning findings on his lab workup. Chest x-ray was clear. He has no rebound tenderness, no guarding, no Murphy's sign. My suspicion of pneumonia is very low. Suspicion of gallbladder pathology or appendicitis very low. Troponin 5 and no obvious ischemic changes on EKG. ACS is unlikely. I believe the patient is having exacerbations of his gastroparesis and PUD. I explained to the patient that he was refusing the majority of medications that I could offer him. I do not see an indication for admission at this time. I believe that the best course of action would be follow up with the patient's GI provider. He unfortunately missed the upper GI endoscopy in February due to illness. Discharge home with strict return precautions     Final Clinical Impression(s) / ED Diagnoses Final diagnoses:  Left upper quadrant abdominal pain  Nausea and vomiting, unspecified vomiting type    Rx / DC Orders ED Discharge Orders     None         Darrick Grinder, PA-C 08/17/21 1420

## 2021-09-24 ENCOUNTER — Ambulatory Visit: Payer: 59 | Admitting: Physician Assistant

## 2022-08-01 IMAGING — CT CT NECK W/ CM
4 series · 14 of 33 positions shown, 17 images · IV contrast (omnipaque)
Comparison: None.

CLINICAL DATA: Sore throat

EXAM:
CT NECK WITH CONTRAST
TECHNIQUE: Multidetector CT imaging of the neck was performed using the
standard protocol following the bolus administration of intravenous
contrast.
CONTRAST:  60mL OMNIPAQUE IOHEXOL 350 MG/ML SOLN

[Series 3: axial neck · axial · 0.52mm/px · z∈[+1396,+1574]mm · 5 of 135 slices shown, 7 images]
[im 23/135  soft-tissue]
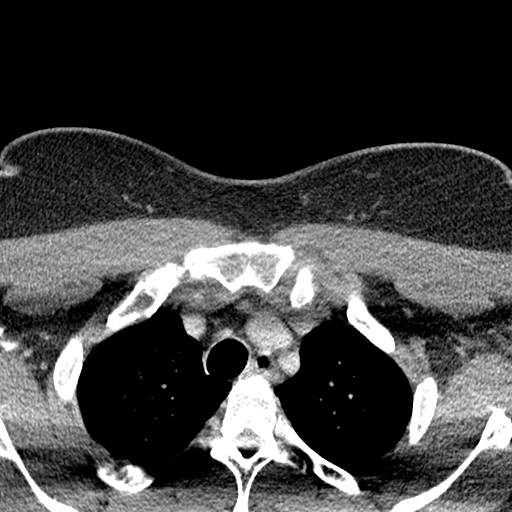
[im 23/135  bone]
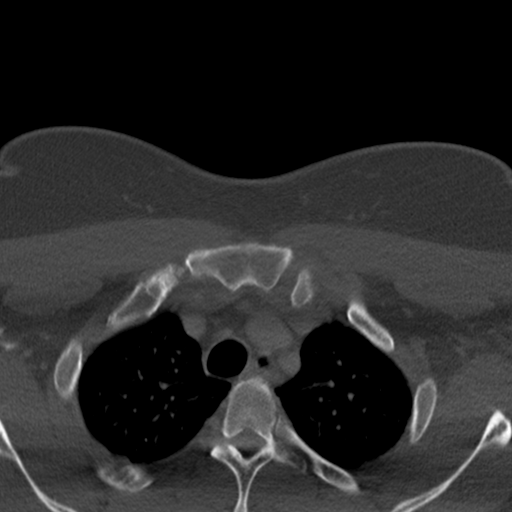
[im 45/135  bone]
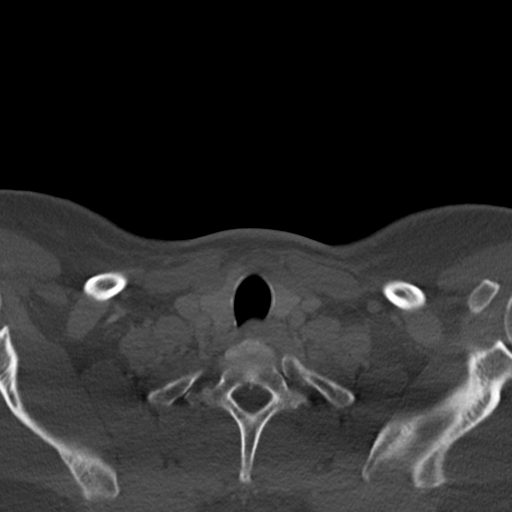
[im 68/135  bone]
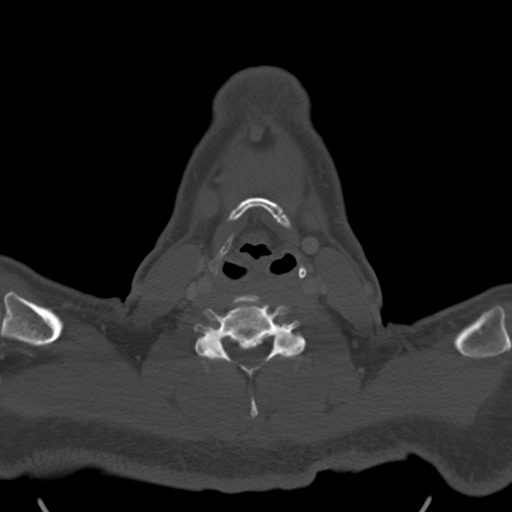
[im 90/135  bone]
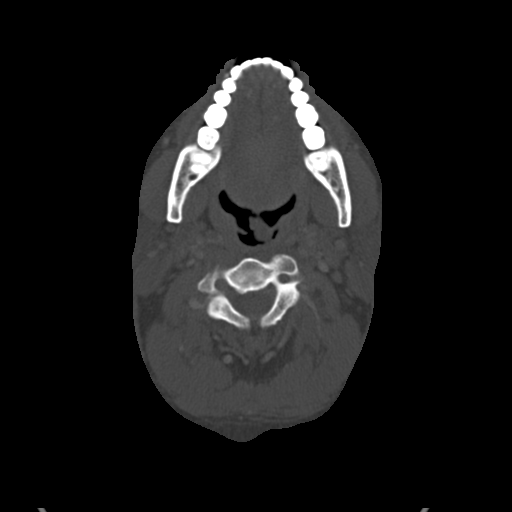
[im 112/135  soft-tissue]
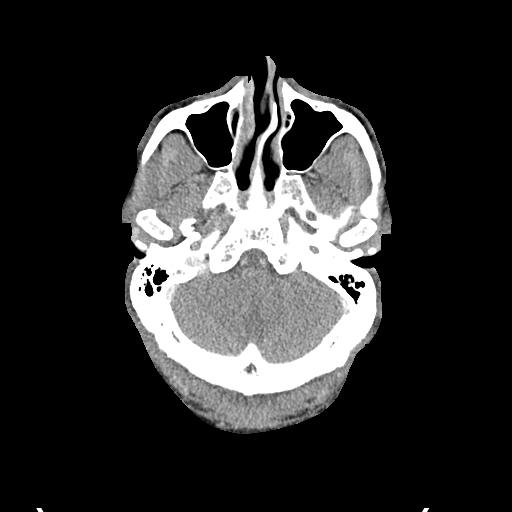
[im 112/135  bone]
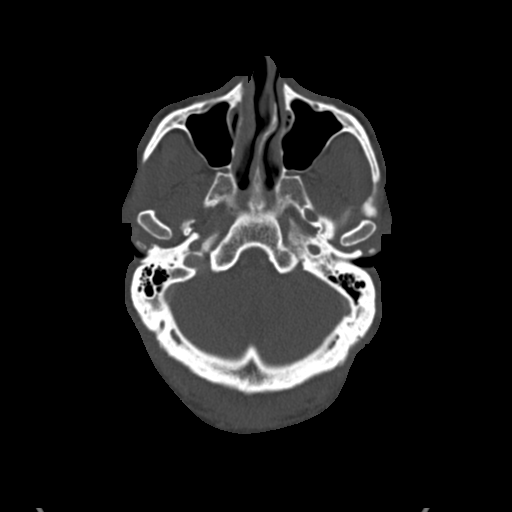

[Series 5: axial · axial · 0.39mm/px · 1 of 132 slices shown]
[im 22/132  bone]
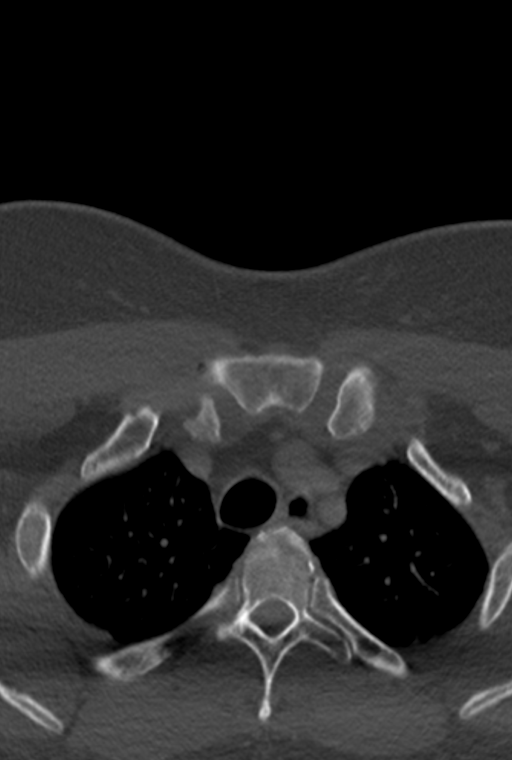

[Series 6: coronal · coronal · 0.42mm/px · 3 of 149 slices shown]
[im 30/149  bone]
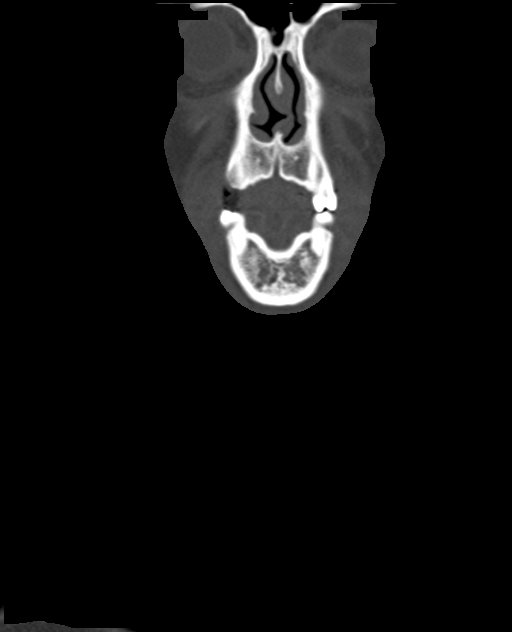
[im 60/149  bone]
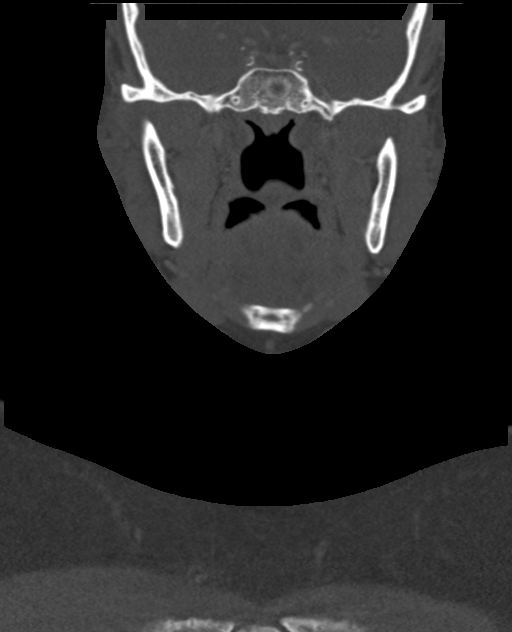
[im 89/149  bone]
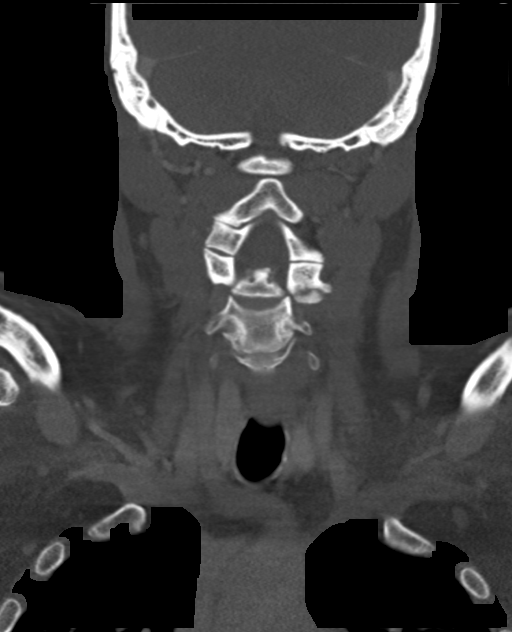

[Series 7: sagittal · sagittal · 0.51mm/px · 5 of 101 slices shown, 6 images]
[im 34/101  bone]
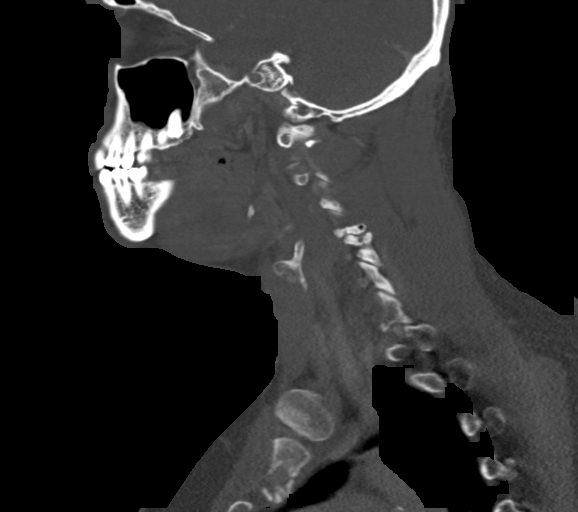
[im 42/101  bone]
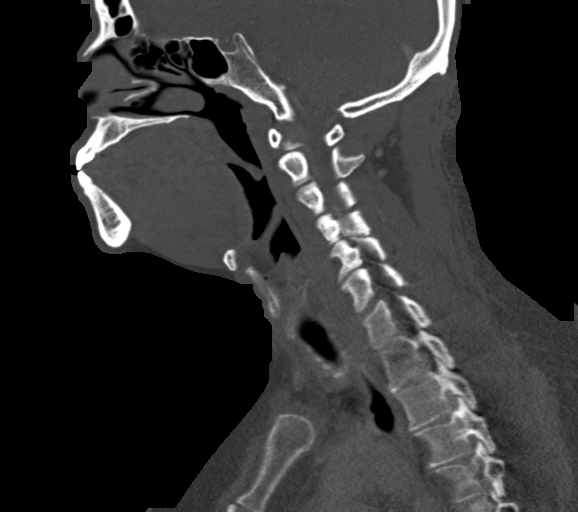
[im 51/101  soft-tissue]
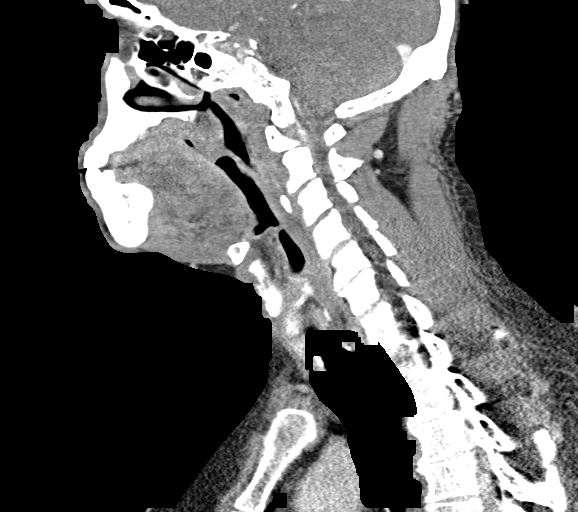
[im 51/101  bone]
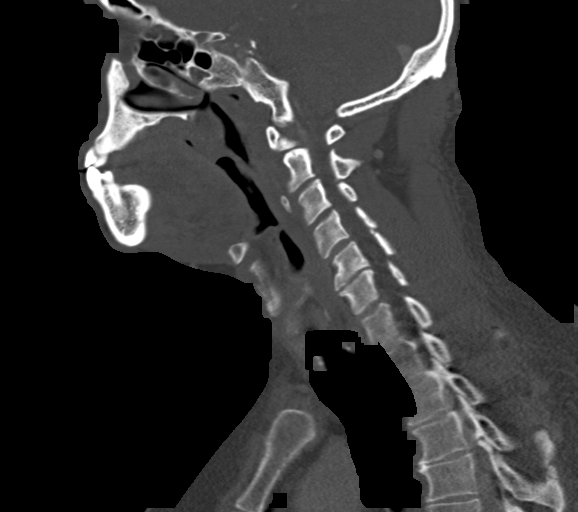
[im 59/101  bone]
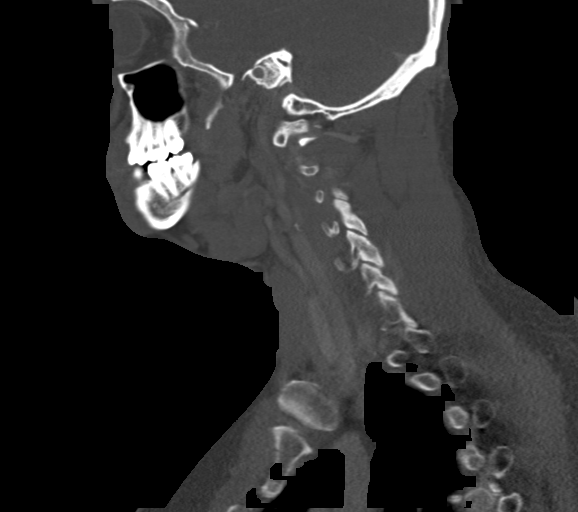
[im 67/101  bone]
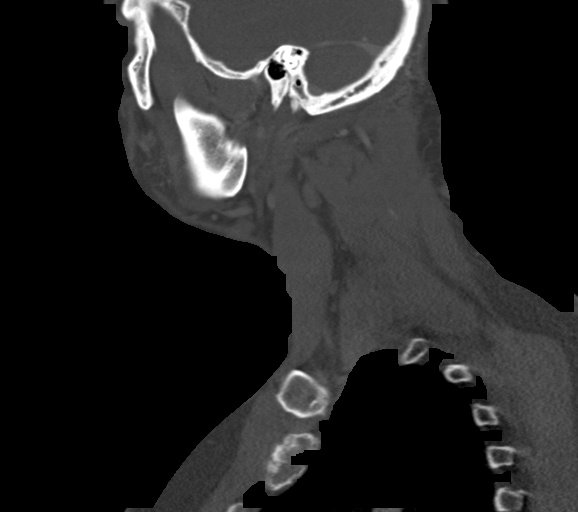

[14 of 33 positions shown; findings below may reference images not displayed]

FINDINGS: PHARYNX AND LARYNX: The nasopharynx, oropharynx and larynx are
normal. Visible portions of the oral cavity, tongue base and floor
of mouth are normal. Normal epiglottis, vallecula and pyriform
sinuses. The larynx is normal. No retropharyngeal abscess, effusion
or lymphadenopathy.

SALIVARY GLANDS: Normal parotid, submandibular and sublingual
glands.

THYROID: Normal.

LYMPH NODES: No enlarged or abnormal density lymph nodes.

VASCULAR: Major cervical vessels are patent.

LIMITED INTRACRANIAL: Normal.

VISUALIZED ORBITS: Normal.

MASTOIDS AND VISUALIZED PARANASAL SINUSES: No fluid levels or
advanced mucosal thickening. No mastoid effusion.

SKELETON: No bony spinal canal stenosis. No lytic or blastic
lesions.

UPPER CHEST: Clear.

OTHER: None.
IMPRESSION: Normal CT of the neck.

## 2022-08-25 ENCOUNTER — Telehealth: Payer: Self-pay | Admitting: Family Medicine

## 2022-08-25 ENCOUNTER — Ambulatory Visit: Payer: 59 | Admitting: Family Medicine

## 2022-08-25 NOTE — Telephone Encounter (Signed)
3.20.24 new pt no show (blocked for reschedule )

## 2022-08-26 NOTE — Telephone Encounter (Signed)
No show for new patient, unable to reschedule at [facility]

## 2022-09-16 ENCOUNTER — Ambulatory Visit
Admission: EM | Admit: 2022-09-16 | Discharge: 2022-09-16 | Disposition: A | Discharge status: Home / Self Care | Payer: 59 | Attending: Nurse Practitioner | Admitting: Nurse Practitioner

## 2022-09-16 DIAGNOSIS — S91101A Unspecified open wound of right great toe without damage to nail, initial encounter: Secondary | ICD-10-CM | POA: Diagnosis not present

## 2022-09-16 DIAGNOSIS — J209 Acute bronchitis, unspecified: Secondary | ICD-10-CM

## 2022-09-16 DIAGNOSIS — Z23 Encounter for immunization: Secondary | ICD-10-CM | POA: Diagnosis not present

## 2022-09-16 MED ORDER — PROMETHAZINE-DM 6.25-15 MG/5ML PO SYRP: 5.0000 mL | ORAL_SOLUTION | Freq: Four times a day (QID) | ORAL | 0 refills | Status: DC | PRN

## 2022-09-16 MED ORDER — ALBUTEROL SULFATE HFA 108 (90 BASE) MCG/ACT IN AERS: 1.0000 | INHALATION_SPRAY | Freq: Four times a day (QID) | RESPIRATORY_TRACT | 0 refills | Status: DC | PRN

## 2022-09-16 MED ORDER — DOXYCYCLINE HYCLATE 100 MG PO CAPS: 100.0000 mg | ORAL_CAPSULE | Freq: Two times a day (BID) | ORAL | 0 refills | Status: DC

## 2022-09-16 MED ORDER — TETANUS-DIPHTH-ACELL PERTUSSIS 5-2.5-18.5 LF-MCG/0.5 IM SUSY
0.5000 mL | PREFILLED_SYRINGE | Freq: Once | INTRAMUSCULAR | Status: AC
  Administered 2022-09-16: 0.5 mL via INTRAMUSCULAR

## 2022-09-16 NOTE — Discharge Instructions (Addendum)
Start doxycycline twice daily for 10 days.  This will cover your respiratory infection as well as cover for your wound on your foot.  Promethazine DM as needed for cough.  Please note this medication can make you drowsy.  Do not drink alcohol or drive while on this medication. Albuterol inhaler as needed for shortness of breath Rest and fluids Please keep the wound clean and dry and covered Please follow-up with wound care for further treatment of your wound Tetanus was also updated in clinic today and is good for 10 years. I highly encourage you to establish with a primary care for overall wellbeing as well as diabetes management Please go to the emergency room if you develop any worsening symptoms

## 2022-09-16 NOTE — ED Triage Notes (Signed)
Patient presents to UC for dry cough and SOB x 3 weeks, taking mucinex gives him some relief. He also reports RLE swelling and wound to right big toe. States he noted it yesterday. Did have some mild drainage. Not cleansing or applying anything to wound. Hx of DM.   Denies fever.

## 2022-09-16 NOTE — ED Provider Notes (Signed)
[facility]    CSN: [id number] Arrival date & time: 09/16/22  1727      History   Chief Complaint Chief Complaint  Patient presents with   Wound Infection   Leg Swelling   Cough    HPI Carlos [name] is a 47 y.o. male  presents for evaluation of URI symptoms for 3 weeks. Patient reports associated symptoms of dry cough with shortness of breath. Denies N/V/D, fevers, ear pain, sore throat, body aches. Patient does not have a hx of asthma or smoking. No known sick contacts.  Pt has taken Mucinex OTC for symptoms.  In addition patient reports a wound to his right great toe.  Patient has history of diabetes and neuropathy.  States yesterday he noticed some blood on his formal walking and noticed an open wound to the base of the plantar aspect of his right great toe.  Denies any known injury or how long the wound has been there.  No drainage.  He does not know his tetanus status.  He has not used any OTC treatments for the wound.  Denies history of MRSA.  He states he does not currently have a PCP.  No other concerns at this time.     Cough   Past Medical History:  Diagnosis Date   Diabetes mellitus     There are no problems to display for this patient.   History reviewed. No pertinent surgical history.     Home Medications    Prior to Admission medications   Medication Sig Start Date End Date Taking? Authorizing Provider  albuterol (VENTOLIN HFA) 108 (90 Base) MCG/ACT inhaler Inhale 1-2 puffs into the lungs every 6 (six) hours as needed for wheezing or shortness of breath. 09/16/22  Yes Radford Pax, NP  doxycycline (VIBRAMYCIN) 100 MG capsule Take 1 capsule (100 mg total) by mouth 2 (two) times daily. 09/16/22  Yes Radford Pax, NP  promethazine-dextromethorphan (PROMETHAZINE-DM) 6.25-15 MG/5ML syrup Take 5 mLs by mouth 4 (four) times daily as needed for cough. 09/16/22  Yes Radford Pax, NP    Family History History reviewed. No pertinent family  history.  Social History Social History   Tobacco Use   Smoking status: Never  Substance Use Topics   Alcohol use: Yes    Comment: rarely   Drug use: No     Allergies   Patient has no known allergies.   Review of Systems Review of Systems  HENT:  Positive for congestion.   Respiratory:  Positive for cough.   Skin:  Positive for wound.     Physical Exam Triage Vital Signs ED Triage Vitals  Enc Vitals Group     BP 09/16/22 1755 (!) 150/89     Pulse Rate 09/16/22 1755 (!) 115     Resp 09/16/22 1755 16     Temp 09/16/22 1755 98 F (36.7 C)     Temp Source 09/16/22 1755 Oral     SpO2 09/16/22 1755 95 %     Weight --      Height --      Head Circumference --      Peak Flow --      Pain Score 09/16/22 1753 3     Pain Loc --      Pain Edu? --      Excl. in GC? --    No data found.  Updated Vital Signs BP (!) 150/89 (BP Location: Left Arm)   Pulse (!) 115  Temp 98 F (36.7 C) (Oral)   Resp 16   SpO2 95%   Visual Acuity Right Eye Distance:   Left Eye Distance:   Bilateral Distance:    Right Eye Near:   Left Eye Near:    Bilateral Near:     Physical Exam Vitals and nursing note reviewed.  Constitutional:      General: He is not in acute distress.    Appearance: Normal appearance. He is not ill-appearing or toxic-appearing.  HENT:     Head: Normocephalic and atraumatic.     Right Ear: Tympanic membrane and ear canal normal.     Left Ear: Tympanic membrane and ear canal normal.     Nose: Congestion present.     Mouth/Throat:     Mouth: Mucous membranes are moist.     Pharynx: No posterior oropharyngeal erythema.  Eyes:     Pupils: Pupils are equal, round, and reactive to light.  Cardiovascular:     Rate and Rhythm: Normal rate and regular rhythm.     Heart sounds: Normal heart sounds.  Pulmonary:     Effort: Pulmonary effort is normal.     Breath sounds: Normal breath sounds.  Musculoskeletal:     Cervical back: Normal range of motion and  neck supple.  Lymphadenopathy:     Cervical: No cervical adenopathy.  Skin:    General: Skin is warm and dry.     Comments: Open wound to plantar aspect of the base of the right great toe.  See picture  Neurological:     General: No focal deficit present.     Mental Status: He is alert and oriented to person, place, and time.  Psychiatric:        Mood and Affect: Mood normal.        Behavior: Behavior normal.      UC Treatments / Results  Labs (all labs ordered are listed, but only abnormal results are displayed) Labs Reviewed - No data to display  EKG   Radiology No results found.  Procedures Procedures (including critical care time)  Medications Ordered in UC Medications  Tdap (BOOSTRIX) injection 0.5 mL (0.5 mLs Intramuscular Given 09/16/22 1825)    Initial Impression / Assessment and Plan / UC Course  I have reviewed the triage vital signs and the nursing notes.  Pertinent labs & imaging results that were available during my care of the patient were reviewed by me and considered in my medical decision making (see chart for details).     Wound cleansed and dressed by nursing staff.  Tetanus was updated in clinic Will start doxycycline that will cover both the wound as well as bronchitis Promethazine DM as needed for cough.  Side effect profile reviewed Albuterol inhaler as needed Wound care reviewed Discussed with patient he needs to establish with a PCP for further evaluation and treatment of his wound as well as overall wellbeing and diabetes management.  Patient was given contact information for [facility] to establish an appointment for ongoing treatment of his wound Patient verbalized understanding of these instructions. Strict ER precautions reviewed and patient verbalized understanding  Final Clinical Impressions(s) / UC Diagnoses   Final diagnoses:  Open wound of right great toe, initial encounter  Acute bronchitis, unspecified organism      Discharge Instructions      Start doxycycline twice daily for 10 days.  This will cover your respiratory infection as well as cover for your wound on your foot.  Promethazine DM as needed for cough.  Please note this medication can make you drowsy.  Do not drink alcohol or drive while on this medication. Albuterol inhaler as needed for shortness of breath Rest and fluids Please keep the wound clean and dry and covered Please follow-up with wound care for further treatment of your wound Tetanus was also updated in clinic today and is good for 10 years. I highly encourage you to establish with a primary care for overall wellbeing as well as diabetes management Please go to the emergency room if you develop any worsening symptoms     ED Prescriptions     Medication Sig Dispense Auth. Provider   doxycycline (VIBRAMYCIN) 100 MG capsule Take 1 capsule (100 mg total) by mouth 2 (two) times daily. 20 capsule Radford PaxMayer, Jodi R, NP   albuterol (VENTOLIN HFA) 108 (90 Base) MCG/ACT inhaler Inhale 1-2 puffs into the lungs every 6 (six) hours as needed for wheezing or shortness of breath. 1 each Radford PaxMayer, Jodi R, NP   promethazine-dextromethorphan (PROMETHAZINE-DM) 6.25-15 MG/5ML syrup Take 5 mLs by mouth 4 (four) times daily as needed for cough. 118 mL Radford PaxMayer, Jodi R, NP      PDMP not reviewed this encounter.   Radford PaxMayer, Jodi R, NP 09/16/22 [phone]

## 2022-10-08 ENCOUNTER — Inpatient Hospital Stay (HOSPITAL_COMMUNITY)
Admission: EM | Admit: 2022-10-08 | Discharge: 2022-10-20 | DRG: 853 | Disposition: A | Discharge status: Rehabilitation Facility | Payer: Medicaid Other | Attending: Internal Medicine | Admitting: Internal Medicine

## 2022-10-08 ENCOUNTER — Encounter (HOSPITAL_COMMUNITY): Payer: Self-pay

## 2022-10-08 ENCOUNTER — Emergency Department (HOSPITAL_COMMUNITY): Payer: Medicaid Other

## 2022-10-08 ENCOUNTER — Other Ambulatory Visit: Payer: Self-pay

## 2022-10-08 DIAGNOSIS — Z5320 Procedure and treatment not carried out because of patient's decision for unspecified reasons: Secondary | ICD-10-CM | POA: Diagnosis present

## 2022-10-08 DIAGNOSIS — E872 Acidosis, unspecified: Secondary | ICD-10-CM | POA: Diagnosis present

## 2022-10-08 DIAGNOSIS — E871 Hypo-osmolality and hyponatremia: Secondary | ICD-10-CM | POA: Diagnosis present

## 2022-10-08 DIAGNOSIS — A480 Gas gangrene: Secondary | ICD-10-CM | POA: Diagnosis not present

## 2022-10-08 DIAGNOSIS — L97515 Non-pressure chronic ulcer of other part of right foot with muscle involvement without evidence of necrosis: Secondary | ICD-10-CM | POA: Diagnosis not present

## 2022-10-08 DIAGNOSIS — M726 Necrotizing fasciitis: Secondary | ICD-10-CM | POA: Diagnosis present

## 2022-10-08 DIAGNOSIS — N179 Acute kidney failure, unspecified: Secondary | ICD-10-CM | POA: Diagnosis present

## 2022-10-08 DIAGNOSIS — E1169 Type 2 diabetes mellitus with other specified complication: Secondary | ICD-10-CM | POA: Diagnosis present

## 2022-10-08 DIAGNOSIS — S88111S Complete traumatic amputation at level between knee and ankle, right lower leg, sequela: Secondary | ICD-10-CM | POA: Diagnosis not present

## 2022-10-08 DIAGNOSIS — Z6831 Body mass index (BMI) 31.0-31.9, adult: Secondary | ICD-10-CM | POA: Diagnosis not present

## 2022-10-08 DIAGNOSIS — I2489 Other forms of acute ischemic heart disease: Secondary | ICD-10-CM | POA: Diagnosis present

## 2022-10-08 DIAGNOSIS — T8741 Infection of amputation stump, right upper extremity: Secondary | ICD-10-CM | POA: Diagnosis not present

## 2022-10-08 DIAGNOSIS — E1152 Type 2 diabetes mellitus with diabetic peripheral angiopathy with gangrene: Secondary | ICD-10-CM | POA: Diagnosis present

## 2022-10-08 DIAGNOSIS — L97519 Non-pressure chronic ulcer of other part of right foot with unspecified severity: Secondary | ICD-10-CM | POA: Diagnosis present

## 2022-10-08 DIAGNOSIS — E11621 Type 2 diabetes mellitus with foot ulcer: Secondary | ICD-10-CM | POA: Diagnosis present

## 2022-10-08 DIAGNOSIS — L97401 Non-pressure chronic ulcer of unspecified heel and midfoot limited to breakdown of skin: Secondary | ICD-10-CM | POA: Diagnosis not present

## 2022-10-08 DIAGNOSIS — R6520 Severe sepsis without septic shock: Secondary | ICD-10-CM | POA: Diagnosis not present

## 2022-10-08 DIAGNOSIS — Z8673 Personal history of transient ischemic attack (TIA), and cerebral infarction without residual deficits: Secondary | ICD-10-CM | POA: Diagnosis not present

## 2022-10-08 DIAGNOSIS — E669 Obesity, unspecified: Secondary | ICD-10-CM | POA: Diagnosis present

## 2022-10-08 DIAGNOSIS — R739 Hyperglycemia, unspecified: Secondary | ICD-10-CM

## 2022-10-08 DIAGNOSIS — E1165 Type 2 diabetes mellitus with hyperglycemia: Secondary | ICD-10-CM | POA: Diagnosis present

## 2022-10-08 DIAGNOSIS — L03115 Cellulitis of right lower limb: Secondary | ICD-10-CM | POA: Diagnosis present

## 2022-10-08 DIAGNOSIS — K219 Gastro-esophageal reflux disease without esophagitis: Secondary | ICD-10-CM | POA: Diagnosis not present

## 2022-10-08 DIAGNOSIS — I340 Nonrheumatic mitral (valve) insufficiency: Secondary | ICD-10-CM | POA: Diagnosis not present

## 2022-10-08 DIAGNOSIS — Z79899 Other long term (current) drug therapy: Secondary | ICD-10-CM

## 2022-10-08 DIAGNOSIS — B955 Unspecified streptococcus as the cause of diseases classified elsewhere: Secondary | ICD-10-CM | POA: Diagnosis not present

## 2022-10-08 DIAGNOSIS — D638 Anemia in other chronic diseases classified elsewhere: Secondary | ICD-10-CM | POA: Diagnosis not present

## 2022-10-08 DIAGNOSIS — S88111D Complete traumatic amputation at level between knee and ankle, right lower leg, subsequent encounter: Secondary | ICD-10-CM | POA: Diagnosis not present

## 2022-10-08 DIAGNOSIS — A491 Streptococcal infection, unspecified site: Secondary | ICD-10-CM | POA: Diagnosis not present

## 2022-10-08 DIAGNOSIS — E119 Type 2 diabetes mellitus without complications: Secondary | ICD-10-CM | POA: Diagnosis not present

## 2022-10-08 DIAGNOSIS — R7881 Bacteremia: Secondary | ICD-10-CM | POA: Diagnosis not present

## 2022-10-08 DIAGNOSIS — E1143 Type 2 diabetes mellitus with diabetic autonomic (poly)neuropathy: Secondary | ICD-10-CM | POA: Diagnosis present

## 2022-10-08 DIAGNOSIS — B950 Streptococcus, group A, as the cause of diseases classified elsewhere: Secondary | ICD-10-CM | POA: Diagnosis not present

## 2022-10-08 DIAGNOSIS — I96 Gangrene, not elsewhere classified: Secondary | ICD-10-CM

## 2022-10-08 DIAGNOSIS — E08621 Diabetes mellitus due to underlying condition with foot ulcer: Secondary | ICD-10-CM | POA: Diagnosis not present

## 2022-10-08 DIAGNOSIS — E869 Volume depletion, unspecified: Secondary | ICD-10-CM | POA: Diagnosis not present

## 2022-10-08 DIAGNOSIS — M86171 Other acute osteomyelitis, right ankle and foot: Secondary | ICD-10-CM | POA: Diagnosis present

## 2022-10-08 DIAGNOSIS — A419 Sepsis, unspecified organism: Principal | ICD-10-CM

## 2022-10-08 DIAGNOSIS — Z8614 Personal history of Methicillin resistant Staphylococcus aureus infection: Secondary | ICD-10-CM | POA: Diagnosis not present

## 2022-10-08 DIAGNOSIS — Z794 Long term (current) use of insulin: Secondary | ICD-10-CM | POA: Diagnosis not present

## 2022-10-08 DIAGNOSIS — R7989 Other specified abnormal findings of blood chemistry: Secondary | ICD-10-CM

## 2022-10-08 DIAGNOSIS — E0852 Diabetes mellitus due to underlying condition with diabetic peripheral angiopathy with gangrene: Secondary | ICD-10-CM

## 2022-10-08 DIAGNOSIS — L97514 Non-pressure chronic ulcer of other part of right foot with necrosis of bone: Secondary | ICD-10-CM | POA: Diagnosis not present

## 2022-10-08 DIAGNOSIS — Z4781 Encounter for orthopedic aftercare following surgical amputation: Secondary | ICD-10-CM | POA: Diagnosis not present

## 2022-10-08 DIAGNOSIS — I10 Essential (primary) hypertension: Secondary | ICD-10-CM | POA: Diagnosis not present

## 2022-10-08 DIAGNOSIS — F4329 Adjustment disorder with other symptoms: Secondary | ICD-10-CM | POA: Diagnosis not present

## 2022-10-08 DIAGNOSIS — A408 Other streptococcal sepsis: Secondary | ICD-10-CM | POA: Diagnosis present

## 2022-10-08 DIAGNOSIS — K3184 Gastroparesis: Secondary | ICD-10-CM | POA: Diagnosis present

## 2022-10-08 DIAGNOSIS — F54 Psychological and behavioral factors associated with disorders or diseases classified elsewhere: Secondary | ICD-10-CM | POA: Diagnosis not present

## 2022-10-08 DIAGNOSIS — I70269 Atherosclerosis of native arteries of extremities with gangrene, unspecified extremity: Secondary | ICD-10-CM

## 2022-10-08 LAB — LACTIC ACID, PLASMA
Lactic Acid, Venous: 3.3 mmol/L (ref 0.5–1.9)
Lactic Acid, Venous: 7.4 mmol/L (ref 0.5–1.9)

## 2022-10-08 LAB — CBC WITH DIFFERENTIAL/PLATELET
Abs Immature Granulocytes: 0 10*3/uL (ref 0.00–0.07)
Band Neutrophils: 1 %
Basophils Absolute: 0 10*3/uL (ref 0.0–0.1)
Basophils Relative: 0 %
Eosinophils Absolute: 0 10*3/uL (ref 0.0–0.5)
Eosinophils Relative: 0 %
HCT: 29.7 % — ABNORMAL LOW (ref 39.0–52.0)
Hemoglobin: 10.1 g/dL — ABNORMAL LOW (ref 13.0–17.0)
Lymphocytes Relative: 2 %
Lymphs Abs: 0.6 10*3/uL — ABNORMAL LOW (ref 0.7–4.0)
MCH: 30.4 pg (ref 26.0–34.0)
MCHC: 34 g/dL (ref 30.0–36.0)
MCV: 89.5 fL (ref 80.0–100.0)
Monocytes Absolute: 1.7 10*3/uL — ABNORMAL HIGH (ref 0.1–1.0)
Monocytes Relative: 6 %
Neutro Abs: 25.5 10*3/uL — ABNORMAL HIGH (ref 1.7–7.7)
Neutrophils Relative %: 91 %
Platelets: 237 10*3/uL (ref 150–400)
RBC: 3.32 MIL/uL — ABNORMAL LOW (ref 4.22–5.81)
RDW: 12.3 % (ref 11.5–15.5)
WBC: 27.7 10*3/uL — ABNORMAL HIGH (ref 4.0–10.5)
nRBC: 0.1 % (ref 0.0–0.2)

## 2022-10-08 LAB — BASIC METABOLIC PANEL
Anion gap: 14 (ref 5–15)
BUN: 23 mg/dL — ABNORMAL HIGH (ref 6–20)
CO2: 24 mmol/L (ref 22–32)
Calcium: 7.7 mg/dL — ABNORMAL LOW (ref 8.9–10.3)
Chloride: 85 mmol/L — ABNORMAL LOW (ref 98–111)
Creatinine, Ser: 1.88 mg/dL — ABNORMAL HIGH (ref 0.61–1.24)
GFR, Estimated: 44 mL/min — ABNORMAL LOW (ref >60)
Glucose, Bld: 515 mg/dL (ref 70–99)
Potassium: 4.5 mmol/L (ref 3.5–5.1)
Sodium: 123 mmol/L — ABNORMAL LOW (ref 135–145)

## 2022-10-08 LAB — COMPREHENSIVE METABOLIC PANEL
ALT: 26 U/L (ref 0–44)
AST: 32 U/L (ref 15–41)
Albumin: 2 g/dL — ABNORMAL LOW (ref 3.5–5.0)
Alkaline Phosphatase: 51 U/L (ref 38–126)
Anion gap: 16 — ABNORMAL HIGH (ref 5–15)
BUN: 19 mg/dL (ref 6–20)
CO2: 25 mmol/L (ref 22–32)
Calcium: 8.2 mg/dL — ABNORMAL LOW (ref 8.9–10.3)
Chloride: 82 mmol/L — ABNORMAL LOW (ref 98–111)
Creatinine, Ser: 2.05 mg/dL — ABNORMAL HIGH (ref 0.61–1.24)
GFR, Estimated: 39 mL/min — ABNORMAL LOW (ref >60)
Glucose, Bld: 474 mg/dL — ABNORMAL HIGH (ref 70–99)
Potassium: 5 mmol/L (ref 3.5–5.1)
Sodium: 123 mmol/L — ABNORMAL LOW (ref 135–145)
Total Bilirubin: 1.4 mg/dL — ABNORMAL HIGH (ref 0.3–1.2)
Total Protein: 7 g/dL (ref 6.5–8.1)

## 2022-10-08 LAB — CBG MONITORING, ED: Glucose-Capillary: 506 mg/dL (ref 70–99)

## 2022-10-08 LAB — OSMOLALITY: Osmolality: 294 mOsm/kg (ref 275–295)

## 2022-10-08 LAB — PROTIME-INR
INR: 1.4 — ABNORMAL HIGH (ref 0.8–1.2)
Prothrombin Time: 16.7 seconds — ABNORMAL HIGH (ref 11.4–15.2)

## 2022-10-08 LAB — HEMOGLOBIN A1C
Hgb A1c MFr Bld: 10.4 % — ABNORMAL HIGH (ref 4.8–5.6)
Mean Plasma Glucose: 251.78 mg/dL

## 2022-10-08 LAB — TROPONIN I (HIGH SENSITIVITY): Troponin I (High Sensitivity): 163 ng/L (ref <18)

## 2022-10-08 LAB — TSH: TSH: 0.653 u[IU]/mL (ref 0.350–4.500)

## 2022-10-08 LAB — MAGNESIUM: Magnesium: 1.8 mg/dL (ref 1.7–2.4)

## 2022-10-08 LAB — VAS US ABI WITH/WO TBI

## 2022-10-08 MED ORDER — ACETAMINOPHEN 500 MG PO TABS
1000.0000 mg | ORAL_TABLET | Freq: Once | ORAL | Status: DC
  Filled 2022-10-08: qty 2

## 2022-10-08 MED ORDER — INSULIN ASPART 100 UNIT/ML IJ SOLN: 20.0000 [IU] | Freq: Once | INTRAMUSCULAR | Status: DC

## 2022-10-08 MED ORDER — ACETAMINOPHEN 325 MG PO TABS: 650.0000 mg | ORAL_TABLET | Freq: Four times a day (QID) | ORAL | Status: DC | PRN

## 2022-10-08 MED ORDER — DICYCLOMINE HCL 10 MG PO CAPS
10.0000 mg | ORAL_CAPSULE | Freq: Once | ORAL | Status: AC
  Administered 2022-10-09: 10 mg via ORAL
  Filled 2022-10-08: qty 1

## 2022-10-08 MED ORDER — SODIUM CHLORIDE 0.9 % IV SOLN
2.0000 g | Freq: Once | INTRAVENOUS | Status: AC
  Administered 2022-10-08: 2 g via INTRAVENOUS
  Filled 2022-10-08: qty 12.5

## 2022-10-08 MED ORDER — SODIUM CHLORIDE 0.9 % IV SOLN: INTRAVENOUS | Status: DC

## 2022-10-08 MED ORDER — ALUM & MAG HYDROXIDE-SIMETH 200-200-20 MG/5ML PO SUSP
30.0000 mL | Freq: Once | ORAL | Status: AC
  Administered 2022-10-09: 30 mL via ORAL
  Filled 2022-10-08: qty 30

## 2022-10-08 MED ORDER — MORPHINE SULFATE (PF) 2 MG/ML IV SOLN: 2.0000 mg | INTRAVENOUS | Status: DC | PRN

## 2022-10-08 MED ORDER — SODIUM CHLORIDE 0.9 % IV SOLN
2.0000 g | Freq: Two times a day (BID) | INTRAVENOUS | Status: DC
  Administered 2022-10-09: 2 g via INTRAVENOUS
  Filled 2022-10-08: qty 12.5

## 2022-10-08 MED ORDER — LACTATED RINGERS IV BOLUS
1000.0000 mL | Freq: Once | INTRAVENOUS | Status: AC
  Administered 2022-10-08: 1000 mL via INTRAVENOUS

## 2022-10-08 MED ORDER — INSULIN ASPART 100 UNIT/ML IJ SOLN: 15.0000 [IU] | Freq: Once | INTRAMUSCULAR | Status: DC

## 2022-10-08 MED ORDER — ACETAMINOPHEN 160 MG/5ML PO SOLN
1000.0000 mg | Freq: Once | ORAL | Status: AC
  Administered 2022-10-08: 1000 mg via ORAL
  Filled 2022-10-08: qty 40.6

## 2022-10-08 MED ORDER — OXYCODONE HCL 5 MG PO TABS: 5.0000 mg | ORAL_TABLET | ORAL | Status: DC | PRN

## 2022-10-08 MED ORDER — INSULIN ASPART 100 UNIT/ML IJ SOLN
0.0000 [IU] | INTRAMUSCULAR | Status: DC
  Administered 2022-10-09: 5 [IU] via SUBCUTANEOUS
  Administered 2022-10-09: 3 [IU] via SUBCUTANEOUS
  Administered 2022-10-09 (×2): 5 [IU] via SUBCUTANEOUS
  Administered 2022-10-10 (×2): 3 [IU] via SUBCUTANEOUS
  Administered 2022-10-10 (×2): 5 [IU] via SUBCUTANEOUS
  Administered 2022-10-10 – 2022-10-11 (×2): 11 [IU] via SUBCUTANEOUS
  Administered 2022-10-11: 8 [IU] via SUBCUTANEOUS
  Administered 2022-10-11: 11 [IU] via SUBCUTANEOUS

## 2022-10-08 MED ORDER — VANCOMYCIN VARIABLE DOSE PER UNSTABLE RENAL FUNCTION (PHARMACIST DOSING): Status: DC

## 2022-10-08 MED ORDER — VANCOMYCIN HCL 2000 MG/400ML IV SOLN
2000.0000 mg | Freq: Once | INTRAVENOUS | Status: AC
  Administered 2022-10-08: 2000 mg via INTRAVENOUS
  Filled 2022-10-08: qty 400

## 2022-10-08 MED ORDER — HYOSCYAMINE SULFATE 0.125 MG SL SUBL
0.2500 mg | SUBLINGUAL_TABLET | Freq: Once | SUBLINGUAL | Status: AC
  Administered 2022-10-09: 0.25 mg via SUBLINGUAL
  Filled 2022-10-08: qty 2

## 2022-10-08 MED ORDER — METRONIDAZOLE 500 MG/100ML IV SOLN
500.0000 mg | Freq: Two times a day (BID) | INTRAVENOUS | Status: DC
  Administered 2022-10-08 – 2022-10-11 (×6): 500 mg via INTRAVENOUS
  Filled 2022-10-08 (×5): qty 100

## 2022-10-08 MED ORDER — ONDANSETRON HCL 4 MG/2ML IJ SOLN: 4.0000 mg | Freq: Four times a day (QID) | INTRAMUSCULAR | Status: DC | PRN

## 2022-10-08 MED ORDER — INSULIN GLARGINE-YFGN 100 UNIT/ML ~~LOC~~ SOLN
20.0000 [IU] | Freq: Every day | SUBCUTANEOUS | Status: DC
  Filled 2022-10-08 (×2): qty 0.2

## 2022-10-08 NOTE — H&P (Incomplete)
PCP:   System, Provider Not In   Chief Complaint:  Diabetic foot  HPI: This is a 47 year old male with past medical history of diabetes mellitus, [untreated], gastroparesis, history of CVA, history of MRSA cellulitis 2021. Weeks ago patient noticed on the bottom of his right foot.  He went to urgent care the next day and was scribed doxycycline.  Doxycycline caused nausea and vomiting, shoulder statement effectively.  His wound got worse, he has been referred to the wound care on May 6.  However things are going south.  Endorses fever, chills, nausea, vomiting, weakness, foot pain  worsening wound.  After much prompting by his children he agreed to come to the ER.  In the ER x-ray shows There are numerous pockets of air in the soft tissues in right foot and right ankle suggesting possible gas gangrene.  Tmax 102.7, WBC 27.7, creatinine 2.05, glucose 474, NA 123, troponin was 63, LA 3.3. FSBS 506 HR 127, RR 22, Tmax as above.   Patient bolused 2 L LR.  Blood cultures x 2 collected.  Cefepime, Flagyl and Vanco given.  Orthopedics Dr. Linna Caprice contacted by EDP.  They will follow History of admit patient.   Review of Systems:  As per HPI  Past Medical History: Past Medical History:  Diagnosis Date   Diabetes mellitus    History reviewed. No pertinent surgical history.  Medications: Prior to Admission medications   Medication Sig Start Date End Date Taking? Authorizing Provider  albuterol (VENTOLIN HFA) 108 (90 Base) MCG/ACT inhaler Inhale 1-2 puffs into the lungs every 6 (six) hours as needed for wheezing or shortness of breath. 09/16/22   Radford Pax, NP  doxycycline (VIBRAMYCIN) 100 MG capsule Take 1 capsule (100 mg total) by mouth 2 (two) times daily. 09/16/22   Radford Pax, NP  promethazine-dextromethorphan (PROMETHAZINE-DM) 6.25-15 MG/5ML syrup Take 5 mLs by mouth 4 (four) times daily as needed for cough. 09/16/22   Radford Pax, NP    Allergies:  No Known Allergies  Social  History:  reports that he has never smoked. He does not have any smokeless tobacco history on file. He reports current alcohol use. He reports that he does not use drugs.  Family History: History reviewed. No pertinent family history.  Physical Exam: Vitals:   10/08/22 1708 10/08/22 1851 10/08/22 1900 10/08/22 1947  BP:  124/78 121/76   Pulse:  (!) 116 (!) 114   Resp:  20    Temp:    98.9 F (37.2 C)  TempSrc:    Oral  SpO2:  99% 95%   Weight: 97.5 kg   97.5 kg  Height: 5\' 9"  (1.753 m)   5\' 9"  (1.753 m)    General:  Alert and oriented times three, well developed and nourished, ill patient Eyes: PERRLA, pink conjunctiva, no scleral icterus ENT: Moist oral mucosa, neck supple, no thyromegaly Lungs: clear to ascultation, no wheeze, no crackles, no use of accessory muscles Cardiovascular: regular rate and rhythm, no regurgitation, no gallops, no murmurs. No carotid bruits, no JVD Abdomen: soft, positive BS, non-tender, non-distended, no organomegaly, not an acute abdomen GU: not examined Neuro: CN II - XII grossly intact, Musculoskeletal: strength 5/5 all extremities. RLE swollen, cellulitis Dry gangrene encompassing majority great toe down to below distal metatarsal. Soft, possibly fluctuant sole of foot. No pulses palpated. Extremity warm Skin: no rash, no subcutaneous crepitation, no decubitus Psych: appropriate weak patient   Labs on Admission:  Recent Labs    10/08/22  1719  NA 123*  K 5.0  CL 82*  CO2 25  GLUCOSE 474*  BUN 19  CREATININE 2.05*  CALCIUM 8.2*   Recent Labs    10/08/22 1719  AST 32  ALT 26  ALKPHOS 51  BILITOT 1.4*  PROT 7.0  ALBUMIN 2.0*    Recent Labs    10/08/22 1719  WBC 27.7*  NEUTROABS 25.5*  HGB 10.1*  HCT 29.7*  MCV 89.5  PLT 237    Radiological Exams on Admission: DG Tibia/Fibula Right  Result Date: 10/08/2022 CLINICAL DATA:  Infection in right foot and right ankle EXAM: RIGHT TIBIA AND FIBULA - 2 VIEW COMPARISON:  None  Available. FINDINGS: No fracture or dislocation is seen. Small linear foci of air in the soft tissues in right calf extending to the level of proximal shaft of tibia. There are more pockets of air posterior to the distal tibia. Arterial calcifications are seen in soft tissues. IMPRESSION: There are pockets of air in the soft tissues in right calf extending to the level of proximal shaft of right tibia suggesting cephalad extension of anaerobic infection. Electronically Signed   By: Ernie Avena M.D.   On: 10/08/2022 19:45   VAS [place] ABI WITH/WO TBI  Result Date: 10/08/2022  LOWER EXTREMITY DOPPLER STUDY Patient Name:  Carlos [name]  Date of Exam:   10/08/2022 Medical Rec #: [id number]   Accession #:    [id number] Date of Birth: 10/25/1975  Patient Gender: M Patient Age:   9 years Exam Location:  [facility] Procedure:      VAS [place] ABI WITH/WO TBI Referring Phys: JOSHUA GEIPLE --------------------------------------------------------------------------------  Indications: Gangrene. High Risk Factors: Diabetes.  Limitations: Today's exam was limited due to patient movement. Comparison Study: No previous exams Performing Technologist: Hill, Jody RVT, RDMS  Examination Guidelines: A complete evaluation includes at minimum, Doppler waveform signals and systolic blood pressure reading at the level of bilateral brachial, anterior tibial, and posterior tibial arteries, when vessel segments are accessible. Bilateral testing is considered an integral part of a complete examination. Photoelectric Plethysmograph (PPG) waveforms and toe systolic pressure readings are included as required and additional duplex testing as needed. Limited examinations for reoccurring indications may be performed as noted.  ABI Findings: +--------+------------------+-----+----------------+----------------+ Right   Rt Pressure (mmHg)IndexWaveform        Comment           +--------+------------------+-----+----------------+----------------+ [id number]                    triphasic                        +--------+------------------+-----+----------------+----------------+ PTA                            audibly biphasicnon compressible +--------+------------------+-----+----------------+----------------+ DP      123               0.93 audibly biphasic                 +--------+------------------+-----+----------------+----------------+ +---------+------------------+-----+--------+----------------+ Left     Lt Pressure (mmHg)IndexWaveformComment          +---------+------------------+-----+--------+----------------+ Brachial 123                                             +---------+------------------+-----+--------+----------------+ PTA  biphasicnon compressible +---------+------------------+-----+--------+----------------+ DP       105               0.80 biphasic                 +---------+------------------+-----+--------+----------------+ Great Toe46                0.35 Abnormal                 +---------+------------------+-----+--------+----------------+ +-------+-----------+-----------+------------+------------+ ABI/TBIToday's ABIToday's TBIPrevious ABIPrevious TBI +-------+-----------+-----------+------------+------------+ Right  [place]                                             +-------+-----------+-----------+------------+------------+ Left   [place]         0.35                                +-------+-----------+-----------+------------+------------+  Arterial wall calcification precludes accurate ankle pressures and ABIs.  Summary: Right: Resting right ankle-brachial index indicates noncompressible right lower extremity arteries. Unable to obtain toe pressure due to gangrene/open wound. Left: Resting left ankle-brachial index indicates noncompressible left lower extremity arteries. The  left toe-brachial index is abnormal. *See table(s) above for measurements and observations.     Preliminary    DG Foot 2 Views Right  Result Date: 10/08/2022 CLINICAL DATA:  Possible sepsis, infected wound in the skin EXAM: RIGHT FOOT - 2 VIEW COMPARISON:  None Available. 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 No definite displaced fracture or dislocation is seen. Evaluation for lytic lesions is limited by the numerous pockets of air in the soft tissues. There is soft tissue swelling over the dorsum. Arterial calcifications are seen. 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 No displaced fracture or dislocation is seen. Arteriosclerosis. Imaging findings were relayed to patient's provider Dr. Jeraldine Loots by telephone call at 6:20 p.m. on 10/08/2022. Electronically Signed   By: Ernie Avena M.D.   On: 10/08/2022 18:23   DG Chest 2 View  Result Date: 10/08/2022 CLINICAL DATA:  Sepsis. EXAM: CHEST - 2 VIEW COMPARISON:  X-ray 08/17/2021 FINDINGS: Underinflation. Borderline cardiac silhouette. Bronchovascular crowding. No consolidation, pneumothorax or effusion. No edema. Films are under penetrated. IMPRESSION: Underinflated x-ray. Borderline size heart. Bronchovascular crowding. Follow-up x-ray with improved inflation can be performed when clinically appropriate Electronically Signed   By: Karen Kays M.D.   On: 10/08/2022 18:18    Assessment/Plan Present on Admission: Severe sepsis/lactic acidosis Necrotizing fascitis -Blood cultures x 2 collected -Continue IV Vanco, cefepime pharmacy to dose, and Flagyl -N.p.o., IV fluid hydration -Orthopedic Dr. Linna Caprice consulted by EDP -Pain management as needed -Wound care consult placed -Lactic acid curve until normalized. Additional 1L LR bolus  ordered -History of MRSA cellulitis 2021 -CT right foot w/ and w/o contrast done shows necrotizing fasciitis.  Dr. Lajoyce Corners to see patient in a.m.  AKI -IV fluid hydration, strict I's and O's, BMP in a.m.  Hyponatremia -TSH, urine sodium, urine and plasma osmolality ordered -NS at 75 cc for now. -BMP every 6 hours from  Elevated troponin -Likely secondary to sepsis/infection.  Gastroparesis -  Diabetes mellitus type 2 uncontrolled -Sliding  scale insulin every 4 hours -Lantus 20 units subcu q. of sleep, first dose now  H/o CVA  General patient very resistant to insulin.  Extensive conversation had re dangers of untreated diabetes and how it hinders wound healing.  Especially patient with poor circulation and diabetes.  After refusing insulin repeatedly, patient finally decided to treat his hyperglycemia.  Diabetic education ordered   Carlos [name] 10/08/2022, 8:13 PM

## 2022-10-08 NOTE — Progress Notes (Addendum)
Consult received for patient in the ED. He has a diabetic foot wound with necrotic tissue. X-rays of his right foot reviewed.  Order placed for right tib/fib x-rays. ABIs pending. Plan for hospitalist admission and IV antibiotics. Dr. Lajoyce Corners to evaluate tomorrow.

## 2022-10-08 NOTE — Progress Notes (Signed)
CSW spoke to the patient to get permission to speak with his daughter. This CSW reached out to the daughter, the daughter is concerned about the patient not having health insurance. At this time this CSW has advised daughter to contact [employer] to discuss options for payment's. This CSW has also advised the daughter to see if the patient can get insurance through social services as the daughter stated that the father recently lost his job. At this time there are no there TOC needs.

## 2022-10-08 NOTE — Progress Notes (Signed)
Pharmacy Antibiotic Note  Carlos [name] is a 47 y.o. male for which pharmacy has been consulted for cefepime and vancomycin dosing for  wound infection .  Patient with a history of DM, gastroparesis, CVA, MRSA cellulitis ('21). Patient presenting with DFI with necrotic tissue.  SCr 2.05 - 0.99 last reading in EPIC from March 2023 WBC 27.7; LA 7.4; T 98.9; HR 102; RR 23  Plan: Metronidazole per MD Cefepime 2g q12hr  Vancomycin 2000 mg once, subsequent dosing as indicated per random vancomycin level until renal function stable and/or improved, at which time scheduled dosing can be considered Trend WBC, Fever, Renal function, Ortho plan F/u cultures, clinical course, WBC De-escalate when able  Height: 5\' 9"  (175.3 cm) Weight: 97.5 kg (215 lb) IBW/kg (Calculated) : 70.7  Temp (24hrs), Avg:102.6 F (39.2 C), Min:102.6 F (39.2 C), Max:102.6 F (39.2 C)  Recent Labs  Lab 10/08/22 1719  WBC 27.7*    CrCl cannot be calculated (Patient's most recent lab result is older than the maximum 21 days allowed.).    No Known Allergies  Microbiology results: Pending  Thank you for allowing pharmacy to be a part of this patient's care.  Carlos [name], PharmD, BCPS 10/08/2022 5:45 PM ED Clinical Pharmacist -  [phone]

## 2022-10-08 NOTE — ED Notes (Signed)
Notified CN of pt needing a room

## 2022-10-08 NOTE — ED Notes (Signed)
Patient refusing ordered doses of Insulin with a CBG 506, patient educated on the risk of hyperglycemia and refusing insulin, patient continues to refuse medication, Dr. Joneen Roach paged to inform/update on patine's refusal.

## 2022-10-08 NOTE — Sepsis Progress Note (Signed)
Elink will follow per sepsis protocol  

## 2022-10-08 NOTE — Sepsis Progress Note (Signed)
Notified provider of need to order repeat lactic acid and fluid bolus.  

## 2022-10-08 NOTE — ED Notes (Signed)
Dr. Joneen Roach alerted of Serum glucose 515, Dr. Joneen Roach to speak with the patient

## 2022-10-08 NOTE — ED Triage Notes (Signed)
Pt arrived POV w/ c/o R big toe is black and swollen x 3 wks. Pt went to UC and was given abx 2 wks ago and s/s have not improved. Upon examination pt's R great toe going up onto anterior portion of foot is edematous, foul odor, eschar, weeping. Pt had difficulty tolerating his abx per his daughter d/t gastroparesis.

## 2022-10-08 NOTE — Progress Notes (Signed)
ABI has been completed.  Preliminary results given to Dr. Jeraldine Loots.   Results can be found under chart review under CV PROC. 10/08/2022 6:57 PM Blessed Cotham RVT, RDMS

## 2022-10-08 NOTE — ED Provider Notes (Addendum)
[place] EMERGENCY DEPARTMENT AT [facility] Provider Note   CSN: [id number] Arrival date & time: 10/08/22  1641     History  Chief Complaint  Patient presents with   Code Sepsis    Carlos [name] is a 47 y.o. male.  Patient presents to the emergency department for evaluation of right foot infection.  Patient has a history of diabetes and neuropathy.  He is noncompliant with medications and he states has not been on any medications for at least a year.  Patient went to urgent care on 09/16/2022 after he developed a wound/ulceration that opened up beneath his right great toe.  He was placed on doxycycline which he could not tolerate due to nausea and vomiting and discontinued.  He was given information to contact wound care but states that he could not be seen until the first week of May.  The wound has expanded.  He has developed purulent drainage, redness and warmth.  He states that he came in today at the urging of his daughter.  Upon arrival temperature was found to be 102.6 F.  Patient does endorse intermittent shaking chills over the past week or so.  Patient does not currently have a primary care physician.       Home Medications Prior to Admission medications   Medication Sig Start Date End Date Taking? Authorizing Provider  albuterol (VENTOLIN HFA) 108 (90 Base) MCG/ACT inhaler Inhale 1-2 puffs into the lungs every 6 (six) hours as needed for wheezing or shortness of breath. 09/16/22   Radford Pax, NP  doxycycline (VIBRAMYCIN) 100 MG capsule Take 1 capsule (100 mg total) by mouth 2 (two) times daily. 09/16/22   Radford Pax, NP  promethazine-dextromethorphan (PROMETHAZINE-DM) 6.25-15 MG/5ML syrup Take 5 mLs by mouth 4 (four) times daily as needed for cough. 09/16/22   Radford Pax, NP      Allergies    Patient has no known allergies.    Review of Systems   Review of Systems  Physical Exam Updated Vital Signs BP 99/63 (BP Location: Right Arm)   Pulse (!) 127    Temp (!) 102.6 F (39.2 C)   Resp (!) 22   Ht 5\' 9"  (1.753 m)   Wt 97.5 kg   SpO2 96%   BMI 31.75 kg/m   Physical Exam Vitals and nursing note reviewed.  Constitutional:      General: He is not in acute distress.    Appearance: He is well-developed.  HENT:     Head: Normocephalic and atraumatic.  Eyes:     General:        Right eye: No discharge.        Left eye: No discharge.     Conjunctiva/sclera: Conjunctivae normal.  Cardiovascular:     Rate and Rhythm: Regular rhythm. Tachycardia present.     Pulses:          Dorsalis pedis pulses are detected w/ Doppler on the right side and detected w/ Doppler on the left side.       Posterior tibial pulses are detected w/ Doppler on the right side and detected w/ Doppler on the left side.     Heart sounds: Normal heart sounds.     Comments: Patient with dopplerable left and right DP and PT pulses however noted to be weaker on the right compared to the left. Pulmonary:     Effort: Pulmonary effort is normal.     Breath sounds: Normal breath sounds.  Abdominal:     Palpations: Abdomen is soft.     Tenderness: There is no abdominal tenderness.  Musculoskeletal:     Cervical back: Normal range of motion and neck supple.  Skin:    General: Skin is warm and dry.     Comments: Patient with gangrenous changes of the right foot as documented.  Patient's great toe in its entirety has turned black with extension of eschar onto the distal portion of the medial foot.  There is associated skin loss.  There is generalized erythema and swelling of the right foot.  This extends to the ankle and patient skin is warm to approximately the medial calf.  Foul odor noted.  There is purulent drainage noted on the bandaging and what is appears to be fluid collection under the skin.  Neurological:     Mental Status: He is alert.              ED Results / Procedures / Treatments   Labs (all labs ordered are listed, but only abnormal results are  displayed) Labs Reviewed  COMPREHENSIVE METABOLIC PANEL - Abnormal; Notable for the following components:      Result Value   Sodium 123 (*)    Chloride 82 (*)    Glucose, Bld 474 (*)    Creatinine, Ser 2.05 (*)    Calcium 8.2 (*)    Albumin 2.0 (*)    Total Bilirubin 1.4 (*)    GFR, Estimated 39 (*)    Anion gap 16 (*)    All other components within normal limits  LACTIC ACID, PLASMA - Abnormal; Notable for the following components:   Lactic Acid, Venous 3.3 (*)    All other components within normal limits  CBC WITH DIFFERENTIAL/PLATELET - Abnormal; Notable for the following components:   WBC 27.7 (*)    RBC 3.32 (*)    Hemoglobin 10.1 (*)    HCT 29.7 (*)    Neutro Abs 25.5 (*)    Lymphs Abs 0.6 (*)    Monocytes Absolute 1.7 (*)    All other components within normal limits  PROTIME-INR - Abnormal; Notable for the following components:   Prothrombin Time 16.7 (*)    INR 1.4 (*)    All other components within normal limits  CULTURE, BLOOD (ROUTINE X 2)  CULTURE, BLOOD (ROUTINE X 2)  LACTIC ACID, PLASMA  URINALYSIS, W/ REFLEX TO CULTURE (INFECTION SUSPECTED)  PATHOLOGIST SMEAR REVIEW  TROPONIN I (HIGH SENSITIVITY)    EKG EKG Interpretation  Date/Time:  Friday Oct 08 2022 18:30:21 EDT Ventricular Rate:  115 PR Interval:  132 QRS Duration: 87 QT Interval:  314 QTC Calculation: 435 R Axis:   58 Text Interpretation: Sinus tachycardia ST-t wave abnormality Abnormal ECG Confirmed by Gerhard Munch [phone]) on 10/08/2022 7:12:28 PM  Radiology DG Foot 2 Views Right  Result Date: 10/08/2022 CLINICAL DATA:  Possible sepsis, infected wound in the skin EXAM: RIGHT FOOT - 2 VIEW COMPARISON:  None Available. 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 No definite displaced fracture or dislocation is seen. Evaluation for lytic lesions is limited by the numerous pockets  of air in the soft tissues. There is soft tissue swelling over the dorsum. Arterial calcifications are seen. 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 No displaced fracture  or dislocation is seen. Arteriosclerosis. Imaging findings were relayed to patient's provider Dr. Jeraldine Loots by telephone call at 6:20 p.m. on 10/08/2022. Electronically Signed   By: Ernie Avena M.D.   On: 10/08/2022 18:23   DG Chest 2 View  Result Date: 10/08/2022 CLINICAL DATA:  Sepsis. EXAM: CHEST - 2 VIEW COMPARISON:  X-ray 08/17/2021 FINDINGS: Underinflation. Borderline cardiac silhouette. Bronchovascular crowding. No consolidation, pneumothorax or effusion. No edema. Films are under penetrated. IMPRESSION: Underinflated x-ray. Borderline size heart. Bronchovascular crowding. Follow-up x-ray with improved inflation can be performed when clinically appropriate Electronically Signed   By: Karen Kays M.D.   On: 10/08/2022 18:18    Procedures Procedures    Medications Ordered in ED Medications  metroNIDAZOLE (FLAGYL) IVPB 500 mg (500 mg Intravenous New Bag/Given 10/08/22 1914)  lactated ringers bolus 1,000 mL (1,000 mLs Intravenous New Bag/Given 10/08/22 1820)  ceFEPIme (MAXIPIME) 2 g in sodium chloride 0.9 % 100 mL IVPB (0 g Intravenous Stopped 10/08/22 1916)  lactated ringers bolus 1,000 mL (1,000 mLs Intravenous New Bag/Given 10/08/22 1912)  acetaminophen (TYLENOL) 160 MG/5ML solution 1,000 mg (1,000 mg Oral Given 10/08/22 1835)    ED Course/ Medical Decision Making/ A&P    Patient seen and examined. History obtained directly from patient. Code sepsis has been ordered. Awaiting lactate. 1L LR ordered. IV abx ordered.   Labs/EKG: Sepsis order set has been placed.  Personally reviewed and interpreted labs to this point including: CBC with white blood cell count 27.7 and hemoglobin 10.1, PT/INR elevated at 16.7 and 1.4 respectively.  This  was ordered to evaluate for signs of endorgan damage related to sepsis.  Imaging: Ordered chest x-ray, x-ray of the right foot  Medications/Fluids: Ordered: Cefepime and Flagyl, IV fluid bolus  Most recent vital signs reviewed and are as follows: BP 99/63 (BP Location: Right Arm)   Pulse (!) 127   Temp (!) 102.6 F (39.2 C)   Resp (!) 22   Ht 5\' 9"  (1.753 m)   Wt 97.5 kg   SpO2 96%   BMI 31.75 kg/m   Initial impression: Sepsis due to right foot gangrene and cellulitis, suspect likely abscess, will need to evaluate for potential of osteomyelitis.  Patient will need surgical consultation.  6:15 PM Reassessment performed. Patient appears stable.  Labs personally reviewed and interpreted including: Notified that lactate was 3.3; CMP shows hyponatremia 123, potassium high normal at 5.0, hypochloridemia at 82; bicarb normal at 25, glucose at 474, AKI noted with creatinine of 2.05 with BUN of 19, normal transaminases, anion gap mildly elevated at 16 likely related to elevated lactate.  Imaging personally visualized and interpreted including: X-ray of the chest, agree no pneumonia; x-ray of the foot, no obvious bony changes however there is significant soft tissue change  Most current vital signs reviewed and are as follows: BP 99/63 (BP Location: Right Arm)   Pulse (!) 127   Temp (!) 102.6 F (39.2 C)   Resp (!) 22   Ht 5\' 9"  (1.753 m)   Wt 97.5 kg   SpO2 96%   BMI 31.75 kg/m   Plan: Will hydrate 2 L LR  6:33 PM Gas gangrene on x-ray, radiologist called Dr. Jeraldine Loots and discussed findings.   ED ECG REPORT   Date: 10/08/2022  Rate: 115  Rhythm: sinus tachycardia  QRS Axis: normal  Intervals: normal  ST/T Wave abnormalities: nonspecific ST/T changes  Conduction Disutrbances:none  Narrative Interpretation:   Old EKG Reviewed: changes noted, new twave inversions inferior and lateral  ST depression  I have personally reviewed the EKG tracing and agree with the computerized  printout as noted.  6:52 PM I consulted with Dr. Linna Caprice by telephone.  Agrees with current plan, ABIs.  He request plain film tib-fib to evaluate for extent of gas noted in the soft tissues.  Stated that Dr. Lajoyce Corners has cases scheduled for tomorrow and will likely talk with him about this case.  7:41 PM I consulted with Dr. Joneen Roach by telephone who will see patient.   8:50 PM Troponin elevated 163, suspect type 2 due to underlying sepsis.    CRITICAL CARE Performed by: Renne Crigler PA-C Total critical care time: 50 minutes Critical care time was exclusive of separately billable procedures and treating other patients. Critical care was necessary to treat or prevent imminent or life-threatening deterioration. Critical care was time spent personally by me on the following activities: development of treatment plan with patient and/or surrogate as well as nursing, discussions with consultants, evaluation of patient's response to treatment, examination of patient, obtaining history from patient or surrogate, ordering and performing treatments and interventions, ordering and review of laboratory studies, ordering and review of radiographic studies, pulse oximetry and re-evaluation of patient's condition.                            Medical Decision Making Amount and/or Complexity of Data Reviewed Labs: ordered. Radiology: ordered.  Risk OTC drugs. Prescription drug management. Decision regarding hospitalization.   Patient with diabetic foot infection and gangrene, sepsis due to the source.  Also found to have acute kidney injury and hyponatremia.  He has anemia but no active bleeding suspected.  Sepsis protocol underway.  Orthopedics has been involved.  Patient does have arterial flow to the foot, but ABI unable to be calculated.  Do not suspect critical limb ischemia at this time.  He will need to be admitted for treatment of sepsis, likely surgery.         Final Clinical Impression(s)  / ED Diagnoses Final diagnoses:  Severe sepsis (HCC)  Gangrene of right foot (HCC)  Cellulitis of right lower extremity  Acute kidney injury (HCC)  Hyperglycemia  Hyponatremia    Rx / DC Orders ED Discharge Orders     None         Renne Crigler, PA-C 10/08/22 1943    Renne Crigler, PA-C 10/08/22 2130    Gerhard Munch, MD 10/08/22 2241

## 2022-10-09 ENCOUNTER — Inpatient Hospital Stay (HOSPITAL_COMMUNITY): Payer: Medicaid Other

## 2022-10-09 DIAGNOSIS — E11621 Type 2 diabetes mellitus with foot ulcer: Secondary | ICD-10-CM

## 2022-10-09 DIAGNOSIS — A419 Sepsis, unspecified organism: Secondary | ICD-10-CM

## 2022-10-09 DIAGNOSIS — L03115 Cellulitis of right lower limb: Secondary | ICD-10-CM

## 2022-10-09 DIAGNOSIS — I96 Gangrene, not elsewhere classified: Secondary | ICD-10-CM

## 2022-10-09 DIAGNOSIS — R6520 Severe sepsis without septic shock: Secondary | ICD-10-CM

## 2022-10-09 DIAGNOSIS — L97515 Non-pressure chronic ulcer of other part of right foot with muscle involvement without evidence of necrosis: Secondary | ICD-10-CM

## 2022-10-09 LAB — BLOOD CULTURE ID PANEL (REFLEXED) - BCID2

## 2022-10-09 LAB — CBC WITH DIFFERENTIAL/PLATELET
Abs Immature Granulocytes: 0.94 10*3/uL — ABNORMAL HIGH (ref 0.00–0.07)
Basophils Absolute: 0.1 10*3/uL (ref 0.0–0.1)
Basophils Relative: 1 %
Eosinophils Absolute: 0 10*3/uL (ref 0.0–0.5)
Eosinophils Relative: 0 %
HCT: 30.1 % — ABNORMAL LOW (ref 39.0–52.0)
Hemoglobin: 10 g/dL — ABNORMAL LOW (ref 13.0–17.0)
Immature Granulocytes: 3 %
Lymphocytes Relative: 5 %
Lymphs Abs: 1.4 10*3/uL (ref 0.7–4.0)
MCH: 29.9 pg (ref 26.0–34.0)
MCHC: 33.2 g/dL (ref 30.0–36.0)
MCV: 89.9 fL (ref 80.0–100.0)
Monocytes Absolute: 1.3 10*3/uL — ABNORMAL HIGH (ref 0.1–1.0)
Monocytes Relative: 5 %
Neutro Abs: 23.9 10*3/uL — ABNORMAL HIGH (ref 1.7–7.7)
Neutrophils Relative %: 86 %
Platelets: 205 10*3/uL (ref 150–400)
RBC: 3.35 MIL/uL — ABNORMAL LOW (ref 4.22–5.81)
RDW: 12.4 % (ref 11.5–15.5)
WBC: 27.7 10*3/uL — ABNORMAL HIGH (ref 4.0–10.5)
nRBC: 0.1 % (ref 0.0–0.2)

## 2022-10-09 LAB — URINALYSIS, W/ REFLEX TO CULTURE (INFECTION SUSPECTED)
Bilirubin Urine: NEGATIVE
Glucose, UA: >=500 mg/dL — AB
Ketones, ur: 5 mg/dL — AB
Leukocytes,Ua: NEGATIVE
Nitrite: NEGATIVE
Protein, ur: 30 mg/dL — AB
Specific Gravity, Urine: 1.023 (ref 1.005–1.030)
pH: 5 (ref 5.0–8.0)

## 2022-10-09 LAB — BASIC METABOLIC PANEL
Anion gap: 12 (ref 5–15)
Anion gap: 12 (ref 5–15)
BUN: 23 mg/dL — ABNORMAL HIGH (ref 6–20)
BUN: 19 mg/dL (ref 6–20)
CO2: 25 mmol/L (ref 22–32)
CO2: 25 mmol/L (ref 22–32)
Calcium: 7.7 mg/dL — ABNORMAL LOW (ref 8.9–10.3)
Calcium: 8 mg/dL — ABNORMAL LOW (ref 8.9–10.3)
Chloride: 88 mmol/L — ABNORMAL LOW (ref 98–111)
Chloride: 92 mmol/L — ABNORMAL LOW (ref 98–111)
Creatinine, Ser: 1.57 mg/dL — ABNORMAL HIGH (ref 0.61–1.24)
Creatinine, Ser: 1.43 mg/dL — ABNORMAL HIGH (ref 0.61–1.24)
GFR, Estimated: 54 mL/min — ABNORMAL LOW (ref >60)
GFR, Estimated: >60 mL/min (ref >60)
Glucose, Bld: 416 mg/dL — ABNORMAL HIGH (ref 70–99)
Glucose, Bld: 205 mg/dL — ABNORMAL HIGH (ref 70–99)
Potassium: 4.4 mmol/L (ref 3.5–5.1)
Potassium: 4 mmol/L (ref 3.5–5.1)
Sodium: 125 mmol/L — ABNORMAL LOW (ref 135–145)
Sodium: 129 mmol/L — ABNORMAL LOW (ref 135–145)

## 2022-10-09 LAB — HIV ANTIBODY (ROUTINE TESTING W REFLEX): HIV Screen 4th Generation wRfx: NONREACTIVE

## 2022-10-09 LAB — GLUCOSE, CAPILLARY
Glucose-Capillary: 447 mg/dL — ABNORMAL HIGH (ref 70–99)
Glucose-Capillary: 351 mg/dL — ABNORMAL HIGH (ref 70–99)
Glucose-Capillary: 205 mg/dL — ABNORMAL HIGH (ref 70–99)
Glucose-Capillary: 103 mg/dL — ABNORMAL HIGH (ref 70–99)
Glucose-Capillary: 202 mg/dL — ABNORMAL HIGH (ref 70–99)
Glucose-Capillary: 216 mg/dL — ABNORMAL HIGH (ref 70–99)
Glucose-Capillary: 185 mg/dL — ABNORMAL HIGH (ref 70–99)

## 2022-10-09 LAB — CBG MONITORING, ED
Glucose-Capillary: 430 mg/dL — ABNORMAL HIGH (ref 70–99)
Glucose-Capillary: 454 mg/dL — ABNORMAL HIGH (ref 70–99)

## 2022-10-09 LAB — LACTIC ACID, PLASMA
Lactic Acid, Venous: 2.5 mmol/L (ref 0.5–1.9)
Lactic Acid, Venous: 2.7 mmol/L (ref 0.5–1.9)
Lactic Acid, Venous: 3.1 mmol/L (ref 0.5–1.9)

## 2022-10-09 LAB — PROTIME-INR
INR: 1.2 (ref 0.8–1.2)
Prothrombin Time: 15.5 seconds — ABNORMAL HIGH (ref 11.4–15.2)

## 2022-10-09 LAB — OSMOLALITY, URINE: Osmolality, Ur: 537 mOsm/kg (ref 300–900)

## 2022-10-09 LAB — SODIUM, URINE, RANDOM: Sodium, Ur: 15 mmol/L

## 2022-10-09 LAB — VAS US ABI WITH/WO TBI

## 2022-10-09 MED ORDER — INSULIN GLARGINE-YFGN 100 UNIT/ML ~~LOC~~ SOLN
22.0000 [IU] | Freq: Every day | SUBCUTANEOUS | Status: DC
  Administered 2022-10-09 – 2022-10-10 (×2): 22 [IU] via SUBCUTANEOUS
  Filled 2022-10-09 (×3): qty 0.22

## 2022-10-09 MED ORDER — IOHEXOL 350 MG/ML SOLN
75.0000 mL | Freq: Once | INTRAVENOUS | Status: AC | PRN
  Administered 2022-10-09: 75 mL via INTRAVENOUS

## 2022-10-09 MED ORDER — INSULIN GLARGINE-YFGN 100 UNIT/ML ~~LOC~~ SOLN: 25.0000 [IU] | Freq: Every day | SUBCUTANEOUS | Status: DC

## 2022-10-09 MED ORDER — ACETAMINOPHEN 325 MG PO TABS: 650.0000 mg | ORAL_TABLET | Freq: Four times a day (QID) | ORAL | Status: DC | PRN

## 2022-10-09 MED ORDER — INSULIN ASPART 100 UNIT/ML IJ SOLN
15.0000 [IU] | Freq: Once | INTRAMUSCULAR | Status: AC
  Administered 2022-10-09: 15 [IU] via SUBCUTANEOUS

## 2022-10-09 MED ORDER — ACETAMINOPHEN 650 MG RE SUPP: 650.0000 mg | Freq: Four times a day (QID) | RECTAL | Status: DC | PRN

## 2022-10-09 MED ORDER — SENNOSIDES-DOCUSATE SODIUM 8.6-50 MG PO TABS: 1.0000 | ORAL_TABLET | Freq: Every evening | ORAL | Status: DC | PRN

## 2022-10-09 MED ORDER — HEPARIN SODIUM (PORCINE) 5000 UNIT/ML IJ SOLN
5000.0000 [IU] | Freq: Three times a day (TID) | INTRAMUSCULAR | Status: DC
  Administered 2022-10-09 – 2022-10-12 (×9): 5000 [IU] via SUBCUTANEOUS
  Filled 2022-10-09 (×12): qty 1

## 2022-10-09 MED ORDER — SODIUM CHLORIDE 0.9 % IV SOLN
2.0000 g | Freq: Three times a day (TID) | INTRAVENOUS | Status: DC
  Administered 2022-10-09 – 2022-10-11 (×6): 2 g via INTRAVENOUS
  Filled 2022-10-09 (×7): qty 12.5

## 2022-10-09 MED ORDER — VANCOMYCIN HCL 1250 MG/250ML IV SOLN
1250.0000 mg | INTRAVENOUS | Status: DC
  Administered 2022-10-09 – 2022-10-10 (×2): 1250 mg via INTRAVENOUS
  Filled 2022-10-09 (×2): qty 250

## 2022-10-09 MED ORDER — INSULIN ASPART 100 UNIT/ML IJ SOLN
17.0000 [IU] | Freq: Once | INTRAMUSCULAR | Status: AC
  Administered 2022-10-09: 17 [IU] via SUBCUTANEOUS

## 2022-10-09 MED ORDER — HYDROMORPHONE HCL 1 MG/ML IJ SOLN: 1.0000 mg | INTRAMUSCULAR | Status: DC | PRN

## 2022-10-09 MED ORDER — INSULIN ASPART 100 UNIT/ML IJ SOLN
10.0000 [IU] | Freq: Once | INTRAMUSCULAR | Status: AC
  Administered 2022-10-09: 10 [IU] via SUBCUTANEOUS

## 2022-10-09 MED ORDER — LACTATED RINGERS IV BOLUS
1000.0000 mL | Freq: Once | INTRAVENOUS | Status: AC
  Administered 2022-10-09: 1000 mL via INTRAVENOUS

## 2022-10-09 NOTE — ED Notes (Signed)
Patient transported to floor by ER Tech with portable cardiac monitor

## 2022-10-09 NOTE — ED Notes (Signed)
ED TO INPATIENT HANDOFF REPORT  ED Nurse Name and Phone #: [phone]  S Name/Age/Gender Carlos [name] 47 y.o. male Room/Bed: 029C/029C  Code Status   Code Status: Not on file  Home/SNF/Other Home Patient oriented to: self, place, time, and situation Is this baseline? Yes   Triage Complete: Triage complete  Chief Complaint Diabetic foot ulcer (HCC) [A54.098, L97.509]  Triage Note Pt arrived POV w/ c/o R big toe is black and swollen x 3 wks. Pt went to UC and was given abx 2 wks ago and s/s have not improved. Upon examination pt's R great toe going up onto anterior portion of foot is edematous, foul odor, eschar, weeping. Pt had difficulty tolerating his abx per his daughter d/t gastroparesis.    Allergies No Known Allergies  Level of Care/Admitting Diagnosis ED Disposition     ED Disposition  Admit   Condition  --   Comment  Hospital Area: [facility] [[id number]]  Level of Care: Telemetry Medical [104]  May admit patient to [facility] or [facility] if equivalent level of care is available:: No  Covid Evaluation: Confirmed COVID Negative  Diagnosis: Diabetic foot ulcer (HCC) [119147]  Admitting Physician: Gery Pray [[id number]  Attending Physician: Gery Pray [phone]  Certification:: I certify this patient will need inpatient services for at least 2 midnights  Estimated Length of Stay: 2          B Medical/Surgery History Past Medical History:  Diagnosis Date   Diabetes mellitus    History reviewed. No pertinent surgical history.   A IV Location/Drains/Wounds Patient Lines/Drains/Airways Status     Active Line/Drains/Airways     Name Placement date Placement time Site Days   Peripheral IV 10/08/22 20 G Anterior;Right Forearm 10/08/22  1814  Forearm  1   Peripheral IV 10/08/22 20 G Right Antecubital 10/08/22  2233  Antecubital  1            Intake/Output Last 24 hours  Intake/Output Summary (Last 24 hours) at 10/09/2022 0040 Last  data filed at 10/08/2022 2250 Gross per 24 hour  Intake 2600 ml  Output --  Net 2600 ml    Labs/Imaging Results for orders placed or performed during the hospital encounter of 10/08/22 (from the past 48 hour(s))  Comprehensive metabolic panel     Status: Abnormal   Collection Time: 10/08/22  5:19 PM  Result Value Ref Range   Sodium 123 (L) 135 - 145 mmol/L   Potassium 5.0 3.5 - 5.1 mmol/L   Chloride 82 (L) 98 - 111 mmol/L   CO2 25 22 - 32 mmol/L   Glucose, Bld 474 (H) 70 - 99 mg/dL    Comment: Glucose reference range applies only to samples taken after fasting for at least 8 hours.   BUN 19 6 - 20 mg/dL   Creatinine, Ser 6.21 (H) 0.61 - 1.24 mg/dL   Calcium 8.2 (L) 8.9 - 10.3 mg/dL   Total Protein 7.0 6.5 - 8.1 g/dL   Albumin 2.0 (L) 3.5 - 5.0 g/dL   AST 32 15 - 41 U/L   ALT 26 0 - 44 U/L   Alkaline Phosphatase 51 38 - 126 U/L   Total Bilirubin 1.4 (H) 0.3 - 1.2 mg/dL   GFR, Estimated 39 (L) >60 mL/min    Comment: (NOTE) Calculated using the CKD-EPI Creatinine Equation (2021)    Anion gap 16 (H) 5 - 15    Comment: Performed at [facility], [address] [place], [place] [address]  Lactic acid, plasma     Status: Abnormal   Collection Time: 10/08/22  5:19 PM  Result Value Ref Range   Lactic Acid, Venous 3.3 (HH) 0.5 - 1.9 mmol/L    Comment: CRITICAL RESULT CALLED TO, READ BACK BY AND VERIFIED WITH Milinda Hirschfeld RN AT [phone] BY D LONG Performed at [facility], [address], [place], [place] [address]   CBC with Differential     Status: Abnormal   Collection Time: 10/08/22  5:19 PM  Result Value Ref Range   WBC 27.7 (H) 4.0 - 10.5 K/uL   RBC 3.32 (L) 4.22 - 5.81 MIL/uL   Hemoglobin 10.1 (L) 13.0 - 17.0 g/dL   HCT 69.6 (L) 29.5 - 28.4 %   MCV 89.5 80.0 - 100.0 fL   MCH 30.4 26.0 - 34.0 pg   MCHC 34.0 30.0 - 36.0 g/dL   RDW 13.2 44.0 - 10.2 %   Platelets 237 150 - 400 K/uL   nRBC 0.1 0.0 - 0.2 %   Neutrophils Relative % 91 %   Neutro Abs 25.5 (H)  1.7 - 7.7 K/uL   Band Neutrophils 1 %   Lymphocytes Relative 2 %   Lymphs Abs 0.6 (L) 0.7 - 4.0 K/uL   Monocytes Relative 6 %   Monocytes Absolute 1.7 (H) 0.1 - 1.0 K/uL   Eosinophils Relative 0 %   Eosinophils Absolute 0.0 0.0 - 0.5 K/uL   Basophils Relative 0 %   Basophils Absolute 0.0 0.0 - 0.1 K/uL   WBC Morphology ATYPICAL MONONUCLEAR CELLS    RBC Morphology MORPHOLOGY UNREMARKABLE    Smear Review MORPHOLOGY UNREMARKABLE    Abs Immature Granulocytes 0.00 0.00 - 0.07 K/uL    Comment: Performed at [facility], [address], [place], [place] [address]  Protime-INR     Status: Abnormal   Collection Time: 10/08/22  5:19 PM  Result Value Ref Range   Prothrombin Time 16.7 (H) 11.4 - 15.2 seconds   INR 1.4 (H) 0.8 - 1.2    Comment: (NOTE) INR goal varies based on device and disease states. Performed at [facility], [address], [place], [place] [address]   Troponin I (High Sensitivity)     Status: Abnormal   Collection Time: 10/08/22  5:28 PM  Result Value Ref Range   Troponin I (High Sensitivity) 163 (HH) <18 ng/L    Comment: CRITICAL RESULT CALLED TO, READ BACK BY AND VERIFIED WITH:  Runell Gess AT [phone] BY D LONG (NOTE) Elevated high sensitivity troponin I (hsTnI) values and significant  changes across serial measurements may suggest ACS but many other  chronic and acute conditions are known to elevate hsTnI results.  Refer to the Links section for chest pain algorithms and additional  guidance. Performed at [facility], [address], [place], [place] [address]   Lactic acid, plasma     Status: Abnormal   Collection Time: 10/08/22  7:45 PM  Result Value Ref Range   Lactic Acid, Venous 7.4 (HH) 0.5 - 1.9 mmol/L    Comment: CRITICAL VALUE NOTED. VALUE IS CONSISTENT WITH PREVIOUSLY REPORTED/CALLED VALUE Performed at [facility], [address], [place], [place] [address]   CBG monitoring, ED     Status: Abnormal   Collection Time:  10/08/22  8:44 PM  Result Value Ref Range   Glucose-Capillary 506 (HH) 70 - 99 mg/dL    Comment:  Glucose reference range applies only to samples taken after fasting for at least 8 hours.   Comment 1 Notify RN   Hemoglobin A1c     Status: Abnormal   Collection Time: 10/08/22  9:09 PM  Result Value Ref Range   Hgb A1c MFr Bld 10.4 (H) 4.8 - 5.6 %    Comment: (NOTE) Pre diabetes:          5.7%-6.4%  Diabetes:              >6.4%  Glycemic control for   <7.0% adults with diabetes    Mean Plasma Glucose 251.78 mg/dL    Comment: Performed at [facility], [address], [place], [place] [address]  TSH     Status: None   Collection Time: 10/08/22  9:09 PM  Result Value Ref Range   TSH 0.653 0.350 - 4.500 uIU/mL    Comment: Performed by a 3rd Generation assay with a functional sensitivity of <=0.01 uIU/mL. Performed at [facility], [address], [place], [place] [address]   Osmolality     Status: None   Collection Time: 10/08/22  9:09 PM  Result Value Ref Range   Osmolality 294 275 - 295 mOsm/kg    Comment: Performed at [facility], [address], [place], [place] [address]  Basic metabolic panel     Status: Abnormal   Collection Time: 10/08/22  9:09 PM  Result Value Ref Range   Sodium 123 (L) 135 - 145 mmol/L   Potassium 4.5 3.5 - 5.1 mmol/L   Chloride 85 (L) 98 - 111 mmol/L   CO2 24 22 - 32 mmol/L   Glucose, Bld 515 (HH) 70 - 99 mg/dL    Comment: CRITICAL RESULT CALLED TO, READ BACK BY AND VERIFIED WITH Runell Gess, RN AT 2200 ON 10/08/22 BY H. HOWARD. Glucose reference range applies only to samples taken after fasting for at least 8 hours.    BUN 23 (H) 6 - 20 mg/dL   Creatinine, Ser 9.14 (H) 0.61 - 1.24 mg/dL   Calcium 7.7 (L) 8.9 - 10.3 mg/dL   GFR, Estimated 44 (L) >60 mL/min    Comment: (NOTE) Calculated using the CKD-EPI Creatinine Equation (2021)    Anion gap 14 5 - 15    Comment: Performed at [facility], [address],  [place], [place] [address]  Magnesium     Status: None   Collection Time: 10/08/22  9:09 PM  Result Value Ref Range   Magnesium 1.8 1.7 - 2.4 mg/dL    Comment: Performed at [facility], [address] [place], [place] [address]   DG Tibia/Fibula Right  Result Date: 10/08/2022 CLINICAL DATA:  Infection in right foot and right ankle EXAM: RIGHT TIBIA AND FIBULA - 2 VIEW COMPARISON:  None Available. FINDINGS: No fracture or dislocation is seen. Small linear foci of air in the soft tissues in right calf extending to the level of proximal shaft of tibia. There are more pockets of air posterior to the distal tibia. Arterial calcifications are seen in soft tissues. IMPRESSION: There are pockets of air in the soft tissues in right calf extending to the level of proximal shaft of right tibia suggesting cephalad extension of anaerobic infection. Electronically Signed   By: Ernie Avena M.D.   On: 10/08/2022 19:45   VAS [place] ABI WITH/WO TBI  Result Date: 10/08/2022  LOWER EXTREMITY DOPPLER STUDY Patient Name:  Carlos [name]  Date of Exam:   10/08/2022 Medical Rec #: [id number]   Accession #:    [id number] Date of Birth: [birth year]  Patient Gender: M Patient Age:   77 years Exam Location:  [facility] Procedure:      VAS [place] ABI WITH/WO TBI Referring Phys: JOSHUA GEIPLE --------------------------------------------------------------------------------  Indications: Gangrene. High Risk Factors: Diabetes.  Limitations: Today's exam was limited due to patient movement. Comparison Study: No previous exams Performing Technologist: Hill, Jody RVT, RDMS  Examination Guidelines: A complete evaluation includes at minimum, Doppler waveform signals and systolic blood pressure reading at the level of bilateral brachial, anterior tibial, and posterior tibial arteries, when vessel segments are accessible. Bilateral testing is considered an integral part of a complete examination. Photoelectric Plethysmograph (PPG) waveforms  and toe systolic pressure readings are included as required and additional duplex testing as needed. Limited examinations for reoccurring indications may be performed as noted.  ABI Findings: +--------+------------------+-----+----------------+----------------+ Right   Rt Pressure (mmHg)IndexWaveform        Comment          +--------+------------------+-----+----------------+----------------+ [id number]                    triphasic                        +--------+------------------+-----+----------------+----------------+ PTA                            audibly biphasicnon compressible +--------+------------------+-----+----------------+----------------+ DP      123               0.93 audibly biphasic                 +--------+------------------+-----+----------------+----------------+ +---------+------------------+-----+--------+----------------+ Left     Lt Pressure (mmHg)IndexWaveformComment          +---------+------------------+-----+--------+----------------+ Brachial 123                                             +---------+------------------+-----+--------+----------------+ PTA                             biphasicnon compressible +---------+------------------+-----+--------+----------------+ DP       105               0.80 biphasic                 +---------+------------------+-----+--------+----------------+ Great Toe46                0.35 Abnormal                 +---------+------------------+-----+--------+----------------+ +-------+-----------+-----------+------------+------------+ ABI/TBIToday's ABIToday's TBIPrevious ABIPrevious TBI +-------+-----------+-----------+------------+------------+ Right  [place]                                             +-------+-----------+-----------+------------+------------+ Left   [place]         0.35                                +-------+-----------+-----------+------------+------------+  Arterial wall  calcification precludes accurate ankle pressures and ABIs.  Summary: Right: Resting right ankle-brachial index indicates noncompressible right lower  extremity arteries. Unable to obtain toe pressure due to gangrene/open wound. Left: Resting left ankle-brachial index indicates noncompressible left lower extremity arteries. The left toe-brachial index is abnormal. *See table(s) above for measurements and observations.     Preliminary    DG Foot 2 Views Right  Result Date: 10/08/2022 CLINICAL DATA:  Possible sepsis, infected wound in the skin EXAM: RIGHT FOOT - 2 VIEW COMPARISON:  None Available. 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 No definite displaced fracture or dislocation is seen. Evaluation for lytic lesions is limited by the numerous pockets of air in the soft tissues. There is soft tissue swelling over the dorsum. Arterial calcifications are seen. 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 No displaced fracture or dislocation is seen. Arteriosclerosis. Imaging findings were relayed to patient's provider Dr. Jeraldine Loots by telephone call at 6:20 p.m. on 10/08/2022. Electronically Signed   By: Ernie Avena M.D.   On: 10/08/2022 18:23   DG Chest 2 View  Result Date: 10/08/2022 CLINICAL DATA:  Sepsis. EXAM: CHEST - 2 VIEW COMPARISON:  X-ray 08/17/2021 FINDINGS: Underinflation. Borderline cardiac silhouette. Bronchovascular crowding. No consolidation, pneumothorax or effusion. No edema. Films are under penetrated. IMPRESSION: Underinflated x-ray. Borderline size heart. Bronchovascular crowding. Follow-up x-ray with improved inflation can be performed when clinically appropriate Electronically Signed   By: Karen Kays M.D.   On: 10/08/2022 18:18    Pending Labs Unresulted Labs (From admission,  onward)     Start     Ordered   10/08/22 2006  Basic metabolic panel  Now then every 6 hours,   R (with STAT occurrences)      10/08/22 2005   10/08/22 2003  Osmolality, urine  Once,   URGENT        10/08/22 2002   10/08/22 2002  Sodium, urine, random  Once,   URGENT        10/08/22 2002   10/08/22 1719  Pathologist smear review  Once,   R        10/08/22 1719   10/08/22 1710  Culture, blood (Routine x 2)  BLOOD CULTURE X 2,   R (with STAT occurrences),   Status:  Canceled      10/08/22 1709   10/08/22 1710  Urinalysis, w/ Reflex to Culture (Infection Suspected) -Urine, Clean Catch  Once,   URGENT       Question:  Specimen Source  Answer:  Urine, Clean Catch   10/08/22 1709            Vitals/Pain Today's Vitals   10/08/22 2030 10/08/22 2100 10/08/22 2130 10/08/22 2200  BP: 106/65 (!) 104/58 104/62 100/60  Pulse: (!) 102 97 95 93  Resp: (!) 37 10    Temp:      TempSrc:      SpO2: 97% 93% 93% 96%  Weight:      Height:      PainSc:        Isolation Precautions No active isolations  Medications Medications  metroNIDAZOLE (FLAGYL) IVPB 500 mg (0 mg Intravenous Stopped 10/08/22 2014)  insulin aspart (novoLOG) injection 0-15 Units ( Subcutaneous Not Given 10/08/22 2137)  0.9 %  sodium chloride infusion ( Intravenous New Bag/Given 10/08/22 2236)  insulin glargine-yfgn (SEMGLEE) injection 20 Units (20 Units Subcutaneous Not Given 10/08/22 2138)  morphine (PF) 2 MG/ML injection 2 mg (has no administration in time range)  oxyCODONE (Oxy IR/ROXICODONE) immediate release tablet 5 mg (has no administration in time range)  acetaminophen (TYLENOL) tablet 650 mg (has no administration in time range)  ondansetron (ZOFRAN) injection 4 mg (has no administration in time range)  ceFEPIme (MAXIPIME) 2 g in sodium chloride 0.9 % 100 mL IVPB (has no administration in time range)  vancomycin variable dose per unstable renal function (pharmacist dosing) (has no administration in time range)  alum &  mag hydroxide-simeth (MAALOX/MYLANTA) 200-200-20 MG/5ML suspension 30 mL (has no administration in time range)  hyoscyamine (LEVSIN SL) SL tablet 0.25 mg (has no administration in time range)  dicyclomine (BENTYL) capsule 10 mg (has no administration in time range)  insulin aspart (novoLOG) injection 15 Units (has no administration in time range)  HYDROmorphone (DILAUDID) injection 1 mg (has no administration in time range)  lactated ringers bolus 1,000 mL (0 mLs Intravenous Stopped 10/08/22 2014)  ceFEPIme (MAXIPIME) 2 g in sodium chloride 0.9 % 100 mL IVPB (0 g Intravenous Stopped 10/08/22 1916)  lactated ringers bolus 1,000 mL (0 mLs Intravenous Stopped 10/08/22 2021)  acetaminophen (TYLENOL) 160 MG/5ML solution 1,000 mg (1,000 mg Oral Given 10/08/22 1835)  vancomycin (VANCOREADY) IVPB 2000 mg/400 mL (0 mg Intravenous Stopped 10/08/22 2250)    Mobility non-ambulatory     Focused Assessments Orthopedic, Sepsis   R Recommendations: See Admitting Provider Note  Report given to:   Additional Notes: Please call with any questions, recheck CBG 1 hour after Insulin administration time

## 2022-10-09 NOTE — Progress Notes (Signed)
Pt is having second thoughts about getting surgery and would like to talk to surgeon again.  Pt stated that he doesn't agree with like he doesn't need Amp to go that far up his leg.

## 2022-10-09 NOTE — Consult Note (Signed)
ORTHOPAEDIC CONSULTATION  REQUESTING PHYSICIAN: Lorin Glass, MD  Chief Complaint: Gangrene right foot.  HPI: Carlos [name] is a 47 y.o. male who presents with a 3-week history of gangrenous changes to the right forefoot.  Patient has a history of uncontrolled type 2 diabetes.  Past Medical History:  Diagnosis Date   Diabetes mellitus    History reviewed. No pertinent surgical history. Social History   Socioeconomic History   Marital status: Single    Spouse name: Not on file   Number of children: Not on file   Years of education: Not on file   Highest education level: Not on file  Occupational History   Not on file  Tobacco Use   Smoking status: Never   Smokeless tobacco: Not on file  Substance and Sexual Activity   Alcohol use: Yes    Comment: rarely   Drug use: No   Sexual activity: Not on file  Other Topics Concern   Not on file  Social History Narrative   Not on file   Social Determinants of Health   Financial Resource Strain: Not on file  Food Insecurity: No Food Insecurity (10/08/2022)   Hunger Vital Sign    Worried About Running Out of Food in the Last Year: Never true    Ran Out of Food in the Last Year: Never true  Transportation Needs: No Transportation Needs (10/08/2022)   PRAPARE - Administrator, Civil Service (Medical): No    Lack of Transportation (Non-Medical): No  Physical Activity: Not on file  Stress: Not on file  Social Connections: Not on file   History reviewed. No pertinent family history. - negative except otherwise stated in the family history section No Known Allergies Prior to Admission medications   Not on File   CT FOOT RIGHT W CONTRAST  Result Date: 10/09/2022 CLINICAL DATA:  Right foot infection EXAM: CT OF THE LOWER RIGHT EXTREMITY WITH CONTRAST TECHNIQUE: Multidetector CT imaging of the lower right extremity was performed according to the standard protocol following intravenous contrast administration. RADIATION  DOSE REDUCTION: This exam was performed according to the departmental dose-optimization program which includes automated exposure control, adjustment of the mA and/or kV according to patient size and/or use of iterative reconstruction technique. CONTRAST:  75mL OMNIPAQUE IOHEXOL 350 MG/ML SOLN COMPARISON:  None Available. FINDINGS: Bones/Joint/Cartilage Normal alignment. No acute fracture or dislocation. No there is gas within the first metatarsal head, as well as the proximal and distal phalanx of the great toe suspicious for changes of osteomyelitis. Joint spaces are preserved. Ligaments Suboptimally assessed by CT. Muscles and Tendons There is extensive subcutaneous and intramuscular gas within the intrinsic musculature of the right foot as well as the terminal visualized right lower extremity musculature. The findings are in keeping with aggressive infection such as necrotizing fasciitis. Gas extends cephalad to the superior margin of the examination. Soft tissues Extensive subcutaneous edema within the visualized right foot. As noted above, extensive subcutaneous gas throughout the right foot visualized right ankle. Advanced vascular calcifications are noted. No discrete drainable fluid collection is seen within the subcutaneous soft tissues. IMPRESSION: 1. Extensive subcutaneous and intramuscular gas within the intrinsic musculature of the right foot as well as the terminal visualized right lower extremity musculature. The findings are in keeping with aggressive infection such as necrotizing fasciitis. The extent of disease extends beyond the cephalad margin of this examination. 2. Gas within the first metatarsal head, as well as the proximal and distal phalanx of the  great toe suspicious for changes of osteomyelitis. 3. Extensive subcutaneous edema within the visualized right foot. No drainable fluid collection. 4. Advanced vascular calcifications. Electronically Signed   By: Helyn Numbers M.D.   On:  10/09/2022 02:29   DG Tibia/Fibula Right  Result Date: 10/08/2022 CLINICAL DATA:  Infection in right foot and right ankle EXAM: RIGHT TIBIA AND FIBULA - 2 VIEW COMPARISON:  None Available. FINDINGS: No fracture or dislocation is seen. Small linear foci of air in the soft tissues in right calf extending to the level of proximal shaft of tibia. There are more pockets of air posterior to the distal tibia. Arterial calcifications are seen in soft tissues. IMPRESSION: There are pockets of air in the soft tissues in right calf extending to the level of proximal shaft of right tibia suggesting cephalad extension of anaerobic infection. Electronically Signed   By: Ernie Avena M.D.   On: 10/08/2022 19:45   VAS [place] ABI WITH/WO TBI  Result Date: 10/08/2022  LOWER EXTREMITY DOPPLER STUDY Patient Name:  Carlos [name]  Date of Exam:   10/08/2022 Medical Rec #: [id number]   Accession #:    [id number] Date of Birth: 03-Jul-1975  Patient Gender: M Patient Age:   32 years Exam Location:  [facility] Procedure:      VAS [place] ABI WITH/WO TBI Referring Phys: JOSHUA GEIPLE --------------------------------------------------------------------------------  Indications: Gangrene. High Risk Factors: Diabetes.  Limitations: Today's exam was limited due to patient movement. Comparison Study: No previous exams Performing Technologist: Hill, Jody RVT, RDMS  Examination Guidelines: A complete evaluation includes at minimum, Doppler waveform signals and systolic blood pressure reading at the level of bilateral brachial, anterior tibial, and posterior tibial arteries, when vessel segments are accessible. Bilateral testing is considered an integral part of a complete examination. Photoelectric Plethysmograph (PPG) waveforms and toe systolic pressure readings are included as required and additional duplex testing as needed. Limited examinations for reoccurring indications may be performed as noted.  ABI Findings:  +--------+------------------+-----+----------------+----------------+ Right   Rt Pressure (mmHg)IndexWaveform        Comment          +--------+------------------+-----+----------------+----------------+ [id number]                    triphasic                        +--------+------------------+-----+----------------+----------------+ PTA                            audibly biphasicnon compressible +--------+------------------+-----+----------------+----------------+ DP      123               0.93 audibly biphasic                 +--------+------------------+-----+----------------+----------------+ +---------+------------------+-----+--------+----------------+ Left     Lt Pressure (mmHg)IndexWaveformComment          +---------+------------------+-----+--------+----------------+ Brachial 123                                             +---------+------------------+-----+--------+----------------+ PTA                             biphasicnon compressible +---------+------------------+-----+--------+----------------+ DP       105  0.80 biphasic                 +---------+------------------+-----+--------+----------------+ Great Toe46                0.35 Abnormal                 +---------+------------------+-----+--------+----------------+ +-------+-----------+-----------+------------+------------+ ABI/TBIToday's ABIToday's TBIPrevious ABIPrevious TBI +-------+-----------+-----------+------------+------------+ Right  [place]                                             +-------+-----------+-----------+------------+------------+ Left   [place]         0.35                                +-------+-----------+-----------+------------+------------+  Arterial wall calcification precludes accurate ankle pressures and ABIs.  Summary: Right: Resting right ankle-brachial index indicates noncompressible right lower extremity arteries. Unable to obtain toe  pressure due to gangrene/open wound. Left: Resting left ankle-brachial index indicates noncompressible left lower extremity arteries. The left toe-brachial index is abnormal. *See table(s) above for measurements and observations.     Preliminary    DG Foot 2 Views Right  Result Date: 10/08/2022 CLINICAL DATA:  Possible sepsis, infected wound in the skin EXAM: RIGHT FOOT - 2 VIEW COMPARISON:  None Available. 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 No definite displaced fracture or dislocation is seen. Evaluation for lytic lesions is limited by the numerous pockets of air in the soft tissues. There is soft tissue swelling over the dorsum. Arterial calcifications are seen. 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 No displaced fracture or dislocation is seen. Arteriosclerosis. Imaging findings were relayed to patient's provider Dr. Jeraldine Loots by telephone call at 6:20 p.m. on 10/08/2022. Electronically Signed   By: Ernie Avena M.D.   On: 10/08/2022 18:23   DG Chest 2 View  Result Date: 10/08/2022 CLINICAL DATA:  Sepsis. EXAM: CHEST - 2 VIEW COMPARISON:  X-ray 08/17/2021 FINDINGS: Underinflation. Borderline cardiac silhouette. Bronchovascular crowding. No consolidation, pneumothorax or effusion. No edema. Films are under penetrated. IMPRESSION: Underinflated x-ray. Borderline size heart. Bronchovascular crowding. Follow-up x-ray with improved inflation can be performed when clinically appropriate Electronically Signed   By: Karen Kays M.D.   On: 10/08/2022 18:18   - pertinent xrays, CT, MRI studies were reviewed and independently interpreted  Positive ROS: All other systems have been reviewed and were otherwise negative with the exception of those mentioned in the HPI and as above.  Physical  Exam: General: Alert, no acute distress Psychiatric: Patient is competent for consent with normal mood and affect Lymphatic: No axillary or cervical lymphadenopathy Cardiovascular: No pedal edema Respiratory: No cyanosis, no use of accessory musculature GI: No organomegaly, abdomen is soft and non-tender    Images:  @ENCIMAGES @  Labs:  Lab Results  Component Value Date   HGBA1C 10.4 (H) 10/08/2022   REPTSTATUS PENDING 10/08/2022   CULT  10/08/2022    NO GROWTH < 24 HOURS Performed at [facility], [address], [place], [place] [address]     Lab Results  Component Value Date   ALBUMIN 2.0 (L) 10/08/2022  ALBUMIN 4.4 08/17/2021   ALBUMIN 4.4 04/23/2021        Latest Ref Rng & Units 10/09/2022    8:03 AM 10/08/2022    5:19 PM 08/17/2021    9:56 AM  CBC EXTENDED  WBC 4.0 - 10.5 K/uL 27.7  27.7  6.1   RBC 4.22 - 5.81 MIL/uL 3.35  3.32  4.63   Hemoglobin 13.0 - 17.0 g/dL 40.9  81.1  91.4   HCT 39.0 - 52.0 % 30.1  29.7  39.9   Platelets 150 - 400 K/uL 205  237  286   NEUT# 1.7 - 7.7 K/uL 23.9  25.5  3.3   Lymph# 0.7 - 4.0 K/uL 1.4  0.6  2.1     Neurologic: Patient does not have protective sensation bilateral lower extremities.   MUSCULOSKELETAL:   Skin: Examination patient has dry gangrenous changes around the right great toe with ischemic changes that extend into the midfoot.  The calf is warm to palpation but there is no crepitation, no clinical signs of air in the soft tissue proximally.  White cell count 27.7 with a hemoglobin of 10.0.  Hemoglobin A1c 10.4 and an albumin of 2.0.  Absolute neutrophil count 23.9 and a lymphocyte count of 1.4.  Temperature 97.6.  Review of the ABIs shows noncompressible biphasic flow at the ankle.  Review of the CT scan shows air extending up to the ankle. Assessment: Assessment: Gangrene right foot  Plan: Will plan for right transtibial amputation tomorrow.  Risks and benefits were discussed including infection  extending proximal to the level of amputation, need for higher level amputation.  Patient states he understands.    Thank you for the consult and the opportunity to see Mr. Huey Nathe, MD [facility] [phone] 12:17 PM

## 2022-10-09 NOTE — H&P (View-Only) (Signed)
 ORTHOPAEDIC CONSULTATION  REQUESTING PHYSICIAN: Dahal, Binaya, MD  Chief Complaint: Gangrene right foot.  HPI: Carlos [name] is a 46 y.o. male who presents with a 3-week history of gangrenous changes to the right forefoot.  Patient has a history of uncontrolled type 2 diabetes.  Past Medical History:  Diagnosis Date   Diabetes mellitus    History reviewed. No pertinent surgical history. Social History   Socioeconomic History   Marital status: Single    Spouse name: Not on file   Number of children: Not on file   Years of education: Not on file   Highest education level: Not on file  Occupational History   Not on file  Tobacco Use   Smoking status: Never   Smokeless tobacco: Not on file  Substance and Sexual Activity   Alcohol use: Yes    Comment: rarely   Drug use: No   Sexual activity: Not on file  Other Topics Concern   Not on file  Social History Narrative   Not on file   Social Determinants of Health   Financial Resource Strain: Not on file  Food Insecurity: No Food Insecurity (10/08/2022)   Hunger Vital Sign    Worried About Running Out of Food in the Last Year: Never true    Ran Out of Food in the Last Year: Never true  Transportation Needs: No Transportation Needs (10/08/2022)   PRAPARE - Transportation    Lack of Transportation (Medical): No    Lack of Transportation (Non-Medical): No  Physical Activity: Not on file  Stress: Not on file  Social Connections: Not on file   History reviewed. No pertinent family history. - negative except otherwise stated in the family history section No Known Allergies Prior to Admission medications   Not on File   CT FOOT RIGHT W CONTRAST  Result Date: 10/09/2022 CLINICAL DATA:  Right foot infection EXAM: CT OF THE LOWER RIGHT EXTREMITY WITH CONTRAST TECHNIQUE: Multidetector CT imaging of the lower right extremity was performed according to the standard protocol following intravenous contrast administration. RADIATION  DOSE REDUCTION: This exam was performed according to the departmental dose-optimization program which includes automated exposure control, adjustment of the mA and/or kV according to patient size and/or use of iterative reconstruction technique. CONTRAST:  75mL OMNIPAQUE IOHEXOL 350 MG/ML SOLN COMPARISON:  None Available. FINDINGS: Bones/Joint/Cartilage Normal alignment. No acute fracture or dislocation. No there is gas within the first metatarsal head, as well as the proximal and distal phalanx of the great toe suspicious for changes of osteomyelitis. Joint spaces are preserved. Ligaments Suboptimally assessed by CT. Muscles and Tendons There is extensive subcutaneous and intramuscular gas within the intrinsic musculature of the right foot as well as the terminal visualized right lower extremity musculature. The findings are in keeping with aggressive infection such as necrotizing fasciitis. Gas extends cephalad to the superior margin of the examination. Soft tissues Extensive subcutaneous edema within the visualized right foot. As noted above, extensive subcutaneous gas throughout the right foot visualized right ankle. Advanced vascular calcifications are noted. No discrete drainable fluid collection is seen within the subcutaneous soft tissues. IMPRESSION: 1. Extensive subcutaneous and intramuscular gas within the intrinsic musculature of the right foot as well as the terminal visualized right lower extremity musculature. The findings are in keeping with aggressive infection such as necrotizing fasciitis. The extent of disease extends beyond the cephalad margin of this examination. 2. Gas within the first metatarsal head, as well as the proximal and distal phalanx of the   great toe suspicious for changes of osteomyelitis. 3. Extensive subcutaneous edema within the visualized right foot. No drainable fluid collection. 4. Advanced vascular calcifications. Electronically Signed   By: Ashesh  Parikh M.D.   On:  10/09/2022 02:29   DG Tibia/Fibula Right  Result Date: 10/08/2022 CLINICAL DATA:  Infection in right foot and right ankle EXAM: RIGHT TIBIA AND FIBULA - 2 VIEW COMPARISON:  None Available. FINDINGS: No fracture or dislocation is seen. Small linear foci of air in the soft tissues in right calf extending to the level of proximal shaft of tibia. There are more pockets of air posterior to the distal tibia. Arterial calcifications are seen in soft tissues. IMPRESSION: There are pockets of air in the soft tissues in right calf extending to the level of proximal shaft of right tibia suggesting cephalad extension of anaerobic infection. Electronically Signed   By: Palani  Rathinasamy M.D.   On: 10/08/2022 19:45   VAS US ABI WITH/WO TBI  Result Date: 10/08/2022  LOWER EXTREMITY DOPPLER STUDY Patient Name:  Carlos [name]  Date of Exam:   10/08/2022 Medical Rec #: [id number]   Accession #:    [id number] Date of Birth: 10/19/1975  Patient Gender: M Patient Age:   46 years Exam Location:  [facility] Procedure:      VAS US ABI WITH/WO TBI Referring Phys: JOSHUA GEIPLE --------------------------------------------------------------------------------  Indications: Gangrene. High Risk Factors: Diabetes.  Limitations: Today's exam was limited due to patient movement. Comparison Study: No previous exams Performing Technologist: Hill, Jody RVT, RDMS  Examination Guidelines: A complete evaluation includes at minimum, Doppler waveform signals and systolic blood pressure reading at the level of bilateral brachial, anterior tibial, and posterior tibial arteries, when vessel segments are accessible. Bilateral testing is considered an integral part of a complete examination. Photoelectric Plethysmograph (PPG) waveforms and toe systolic pressure readings are included as required and additional duplex testing as needed. Limited examinations for reoccurring indications may be performed as noted.  ABI Findings:  +--------+------------------+-----+----------------+----------------+ Right   Rt Pressure (mmHg)IndexWaveform        Comment          +--------+------------------+-----+----------------+----------------+ Brachial132                    triphasic                        +--------+------------------+-----+----------------+----------------+ PTA                            audibly biphasicnon compressible +--------+------------------+-----+----------------+----------------+ DP      123               0.93 audibly biphasic                 +--------+------------------+-----+----------------+----------------+ +---------+------------------+-----+--------+----------------+ Left     Lt Pressure (mmHg)IndexWaveformComment          +---------+------------------+-----+--------+----------------+ Brachial 123                                             +---------+------------------+-----+--------+----------------+ PTA                             biphasicnon compressible +---------+------------------+-----+--------+----------------+ DP       105                 0.80 biphasic                 +---------+------------------+-----+--------+----------------+ Great Toe46                0.35 Abnormal                 +---------+------------------+-----+--------+----------------+ +-------+-----------+-----------+------------+------------+ ABI/TBIToday's ABIToday's TBIPrevious ABIPrevious TBI +-------+-----------+-----------+------------+------------+ Right  [place]                                             +-------+-----------+-----------+------------+------------+ Left   [place]         0.35                                +-------+-----------+-----------+------------+------------+  Arterial wall calcification precludes accurate ankle pressures and ABIs.  Summary: Right: Resting right ankle-brachial index indicates noncompressible right lower extremity arteries. Unable to obtain toe  pressure due to gangrene/open wound. Left: Resting left ankle-brachial index indicates noncompressible left lower extremity arteries. The left toe-brachial index is abnormal. *See table(s) above for measurements and observations.     Preliminary    DG Foot 2 Views Right  Result Date: 10/08/2022 CLINICAL DATA:  Possible sepsis, infected wound in the skin EXAM: RIGHT FOOT - 2 VIEW COMPARISON:  None Available. 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 No definite displaced fracture or dislocation is seen. Evaluation for lytic lesions is limited by the numerous pockets of air in the soft tissues. There is soft tissue swelling over the dorsum. Arterial calcifications are seen. 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 No displaced fracture or dislocation is seen. Arteriosclerosis. Imaging findings were relayed to patient's provider Dr. Lockwood by telephone call at 6:20 p.m. on 10/08/2022. Electronically Signed   By: Palani  Rathinasamy M.D.   On: 10/08/2022 18:23   DG Chest 2 View  Result Date: 10/08/2022 CLINICAL DATA:  Sepsis. EXAM: CHEST - 2 VIEW COMPARISON:  X-ray 08/17/2021 FINDINGS: Underinflation. Borderline cardiac silhouette. Bronchovascular crowding. No consolidation, pneumothorax or effusion. No edema. Films are under penetrated. IMPRESSION: Underinflated x-ray. Borderline size heart. Bronchovascular crowding. Follow-up x-ray with improved inflation can be performed when clinically appropriate Electronically Signed   By: Ashok  Gupta M.D.   On: 10/08/2022 18:18   - pertinent xrays, CT, MRI studies were reviewed and independently interpreted  Positive ROS: All other systems have been reviewed and were otherwise negative with the exception of those mentioned in the HPI and as above.  Physical  Exam: General: Alert, no acute distress Psychiatric: Patient is competent for consent with normal mood and affect Lymphatic: No axillary or cervical lymphadenopathy Cardiovascular: No pedal edema Respiratory: No cyanosis, no use of accessory musculature GI: No organomegaly, abdomen is soft and non-tender    Images:  @ENCIMAGES@  Labs:  Lab Results  Component Value Date   HGBA1C 10.4 (H) 10/08/2022   REPTSTATUS PENDING 10/08/2022   CULT  10/08/2022    NO GROWTH < 24 HOURS Performed at [facility], [address] [place], [place] [address]     Lab Results  Component Value Date   ALBUMIN 2.0 (L) 10/08/2022     ALBUMIN 4.4 08/17/2021   ALBUMIN 4.4 04/23/2021        Latest Ref Rng & Units 10/09/2022    8:03 AM 10/08/2022    5:19 PM 08/17/2021    9:56 AM  CBC EXTENDED  WBC 4.0 - 10.5 K/uL 27.7  27.7  6.1   RBC 4.22 - 5.81 MIL/uL 3.35  3.32  4.63   Hemoglobin 13.0 - 17.0 g/dL 10.0  10.1  14.2   HCT 39.0 - 52.0 % 30.1  29.7  39.9   Platelets 150 - 400 K/uL 205  237  286   NEUT# 1.7 - 7.7 K/uL 23.9  25.5  3.3   Lymph# 0.7 - 4.0 K/uL 1.4  0.6  2.1     Neurologic: Patient does not have protective sensation bilateral lower extremities.   MUSCULOSKELETAL:   Skin: Examination patient has dry gangrenous changes around the right great toe with ischemic changes that extend into the midfoot.  The calf is warm to palpation but there is no crepitation, no clinical signs of air in the soft tissue proximally.  White cell count 27.7 with a hemoglobin of 10.0.  Hemoglobin A1c 10.4 and an albumin of 2.0.  Absolute neutrophil count 23.9 and a lymphocyte count of 1.4.  Temperature 97.6.  Review of the ABIs shows noncompressible biphasic flow at the ankle.  Review of the CT scan shows air extending up to the ankle. Assessment: Assessment: Gangrene right foot  Plan: Will plan for right transtibial amputation tomorrow.  Risks and benefits were discussed including infection  extending proximal to the level of amputation, need for higher level amputation.  Patient states he understands.    Thank you for the consult and the opportunity to see Carlos [name]  Alisa Stjames, MD [facility] [phone] 12:17 PM      

## 2022-10-09 NOTE — Progress Notes (Signed)
PHARMACY - PHYSICIAN COMMUNICATION CRITICAL VALUE ALERT - BLOOD CULTURE IDENTIFICATION (BCID)  Carlos [name] is an 47 y.o. male who presented to [facility] on 10/08/2022  Assessment:  46 yom presenting with gangrenous changes to R forefoot with planned transtibial amputation 5/5. Patient started on broad spectrum antibiotics. Blood cultures resulted 2/4 bottles with strep species.  Name of physician (or Provider) Contacted: Jonathon Bellows  Current antibiotics: cefepime/vancomycin/flagyl  Changes to prescribed antibiotics recommended:  Continue current antibiotics for now with plan to re-visit narrowing therapy post-op  Results for orders placed or performed during the hospital encounter of 10/08/22  Blood Culture ID Panel (Reflexed) (Collected: 10/08/2022  5:19 PM)  Result Value Ref Range   Enterococcus faecalis NOT DETECTED NOT DETECTED   Enterococcus Faecium NOT DETECTED NOT DETECTED   Listeria monocytogenes NOT DETECTED NOT DETECTED   Staphylococcus species NOT DETECTED NOT DETECTED   Staphylococcus aureus (BCID) NOT DETECTED NOT DETECTED   Staphylococcus epidermidis NOT DETECTED NOT DETECTED   Staphylococcus lugdunensis NOT DETECTED NOT DETECTED   Streptococcus species DETECTED (A) NOT DETECTED   Streptococcus agalactiae NOT DETECTED NOT DETECTED   Streptococcus pneumoniae NOT DETECTED NOT DETECTED   Streptococcus pyogenes NOT DETECTED NOT DETECTED   A.calcoaceticus-baumannii NOT DETECTED NOT DETECTED   Bacteroides fragilis NOT DETECTED NOT DETECTED   Enterobacterales NOT DETECTED NOT DETECTED   Enterobacter cloacae complex NOT DETECTED NOT DETECTED   Escherichia coli NOT DETECTED NOT DETECTED   Klebsiella aerogenes NOT DETECTED NOT DETECTED   Klebsiella oxytoca NOT DETECTED NOT DETECTED   Klebsiella pneumoniae NOT DETECTED NOT DETECTED   Proteus species NOT DETECTED NOT DETECTED   Salmonella species NOT DETECTED NOT DETECTED   Serratia marcescens NOT DETECTED NOT DETECTED   Haemophilus  influenzae NOT DETECTED NOT DETECTED   Neisseria meningitidis NOT DETECTED NOT DETECTED   Pseudomonas aeruginosa NOT DETECTED NOT DETECTED   Stenotrophomonas maltophilia NOT DETECTED NOT DETECTED   Candida albicans NOT DETECTED NOT DETECTED   Candida auris NOT DETECTED NOT DETECTED   Candida glabrata NOT DETECTED NOT DETECTED   Candida krusei NOT DETECTED NOT DETECTED   Candida parapsilosis NOT DETECTED NOT DETECTED   Candida tropicalis NOT DETECTED NOT DETECTED   Cryptococcus neoformans/gattii NOT DETECTED NOT DETECTED    Leia Alf, PharmD, BCPS Please check AMION for all [facility] contact numbers Clinical Pharmacist 10/09/2022 4:35 PM

## 2022-10-09 NOTE — Consult Note (Signed)
WOC Nurse Consult Note: Reason for Consult:Right great toe and surrounding tissue with gangrenous changes. WOC Nursing is simultaneously consulted with Orthopedics and per Dr Lajoyce Lauber note from last evening, Dr .Lajoyce Corners is to see this morning. Wound type: infectious Pressure Injury POA: N/A  I have provided Nursing with guidance for dry dressings today.  WOC nursing team will not follow, but will remain available to this patient, the nursing and medical teams.  Please re-consult if needed.  Thank you for inviting [place] to participate in this patient's Plan of Care.  Ladona Mow, MSN, RN, CNS, GNP, Leda Min, [employer], [employer] phone:  [phone]

## 2022-10-09 NOTE — Progress Notes (Signed)
PROGRESS NOTE  Carlos [name]  DOB: 1975-12-02  PCP: System, Provider Not In ZOX:[id number]  DOA: 10/08/2022  LOS: 1 day  Hospital Day: 2  Brief narrative: Carlos [name] is a 47 y.o. male with PMH significant for untreated DM2, gastroparesis, CVA, MRSA cellulitis 2021 5/3, patient presented to the ED with complaint of progressive swelling and blackening of right big toe for 3 weeks. 2 weeks ago, hewas seen at urgent care center and was prescribed a course of doxycycline which hecould not tolerate because of nausea and vomiting. His wound continued to grow. He also started having fever, chills, and hence came to the ED.  In the ED, patient had a fever of 102.6, heart rate 126, blood pressure in 90s, breathing on room air She was noted to have edematous right great toe with weeping, foul order and eschar Initial labs with WC count 27.7, hemoglobin 10.1, lactic acid 3.3, blood glucose elevated over 500, sodium 123, creatinine 2.05, troponin 160 3.  Repeat lactic acid elevated up to 7.4, subsequently down to 2.5 Blood culture was collected Urinalysis showed cloudy yellow urine negative for leukocytes, negative nitrite, rare bacteria  X-ray right foot showed numerous pockets of air in the soft tissue.  Right foot and right ankle suggestive of possible gas gangrene.  No fracture or dislocation seen.  CT right foot with contrast showed  1. Extensive subcutaneous and intramuscular gas within the intrinsic musculature of the right foot as well as the terminal visualized right lower extremity musculature. The findings are in keeping with aggressive infection such as necrotizing fasciitis. The extent of disease extends beyond the cephalad margin of this examination. 2. Gas within the first metatarsal head, as well as the proximal and distal phalanx of the great toe suspicious for changes of osteomyelitis. 3. Extensive subcutaneous edema within the visualized right foot. No drainable fluid collection. 4.  Advanced vascular calcifications.   Patient was given IV fluid bolus, broad-spectrum IV antibiotics Orthopedics Dr. Linna Caprice was consulted  Admitted to [facility]  Subjective: Patient was seen and examined this morning.  Middle-aged African-American male.  Looks older for his stated age.  Pain controlled. Has gangrenous right foot.  Pending orthopedic evaluation.  No family at bedside. Chart reviewed. No recurrence of fever overnight, heart rate improved to 90s but 121 this morning Repeat labs showed WBC count still elevated at 27.7 this morning  Assessment and plan: Severe sepsis - POA Necrotizing fasciitis of right lower extremity Presented with worsening right foot wound.  Imaging as above showing gas gangrene as well as necrotizing fasciitis Met criteria for severe sepsis based on fever, tachycardia, infection, leukocytosis, AKI and elevated lactic acid level.  Lactic acid level was elevated as high as 7.4 at 1 point but quickly trended down and patient did not have any episode of hypotension. Blood culture collected Currently on broad-spectrum IV antibiotics with consideration of history of MRSA related in 2021 Orthopedics contacted from ED, to consult this morning Remains n.p.o. for potential surgical intervention No recurrence of fever but WC count remains elevated.  Repeat lactic acid level in a.m. to document normalization. Recent Labs  Lab 10/08/22 1719 10/08/22 1945 10/09/22 0118 10/09/22 0430 10/09/22 0803  WBC 27.7*  --   --   --  27.7*  LATICACIDVEN 3.3* 7.4* 2.5* 2.7*  --     AKI Lactic acidosis Due to tissue hypoperfusion and renal hypoperfusion. Creatinine was normal in March 2023.  Presented with creatinine elevated 2.05.  Gradually downtrending with IV hydration. Recent  Labs    10/08/22 1719 10/08/22 2109 10/09/22 0430 10/09/22 0803  BUN 19 23* 23* 19  CREATININE 2.05* 1.88* 1.57* 1.43*    Acute hyponatremia baseline sodium level was normal.  Presented  with low level of 123.   Serum osmolality is normal but urine is maladies elevated.   Gradually improving sodium level with IV fluid.  Continue monitor Recent Labs  Lab 10/08/22 1719 10/08/22 2109 10/09/22 0430 10/09/22 0803  NA 123* 123* 125* 129*   Elevated troponin Demand ischemia likely secondary to sepsis/infection.  No active anginal symptoms. Recent Labs    10/08/22 1728  TROPONINIHS 163*   Type 2 diabetes mellitus uncontrolled with hyperglycemia A1c 10.4 on 10/08/2022.  Presented with a blood sugar level elevated over 500 PTA not on any meds. Currently on Lantus 20 units nightly, sliding scale insulin with Accu-Cheks Recent Labs  Lab 10/09/22 0043 10/09/22 0232 10/09/22 0326 10/09/22 0454 10/09/22 0740  GLUCAP 454* 430* 447* 351* 205*   Diabetic gastroparesis PTA not on any meds.  He states he did not have any symptoms prior to taking doxycycline.   H/o CVA Not on any meds     Mobility: PT eval postprocedure  Goals of care   Code Status: Full Code    DVT prophylaxis:  heparin injection 5,000 Units Start: 10/09/22 2200   Antimicrobials: IV cefepime, IV vancomycin, IV Flagyl Fluid: Normal saline at 125 mill per hour Consultants: Orthopedics Family Communication: None at bedside  Status: Inpatient Level of care:  Telemetry Medical   Patient from: Home Anticipated d/c to: Pending clinical course Needs to continue in-hospital care:  Needs surgical intervention    Diet:  Diet Order             Diet NPO time specified  Diet effective now                   Scheduled Meds:  heparin  5,000 Units Subcutaneous Q8H   insulin aspart  0-15 Units Subcutaneous Q4H   insulin glargine-yfgn  20 Units Subcutaneous QHS   vancomycin variable dose per unstable renal function (pharmacist dosing)   Does not apply See admin instructions    PRN meds: acetaminophen **OR** acetaminophen, HYDROmorphone (DILAUDID) injection, morphine injection, ondansetron  (ZOFRAN) IV, oxyCODONE, senna-docusate   Infusions:   sodium chloride 125 mL/hr at 10/09/22 0258   ceFEPime (MAXIPIME) IV 2 g (10/09/22 0553)   metronidazole Stopped (10/08/22 2014)    Antimicrobials: Anti-infectives (From admission, onward)    Start     Dose/Rate Route Frequency Ordered Stop   10/09/22 0600  ceFEPIme (MAXIPIME) 2 g in sodium chloride 0.9 % 100 mL IVPB        2 g 200 mL/hr over 30 Minutes Intravenous Every 12 hours 10/08/22 2103 10/16/22 0559   10/08/22 2103  vancomycin variable dose per unstable renal function (pharmacist dosing)         Does not apply See admin instructions 10/08/22 2103     10/08/22 2015  vancomycin (VANCOREADY) IVPB 2000 mg/400 mL        2,000 mg 200 mL/hr over 120 Minutes Intravenous  Once 10/08/22 2005 10/08/22 2250   10/08/22 1800  ceFEPIme (MAXIPIME) 2 g in sodium chloride 0.9 % 100 mL IVPB        2 g 200 mL/hr over 30 Minutes Intravenous  Once 10/08/22 1748 10/08/22 1916   10/08/22 1745  metroNIDAZOLE (FLAGYL) IVPB 500 mg        500 mg  100 mL/hr over 60 Minutes Intravenous Every 12 hours 10/08/22 1736 10/15/22 2159       Nutritional status:  Body mass index is 31.74 kg/m.          Objective: Vitals:   10/09/22 0600 10/09/22 0738  BP: 124/78 126/84  Pulse: (!) 108 (!) 121  Resp: 16 18  Temp: 98.9 F (37.2 C) 98.7 F (37.1 C)  SpO2: 94% 96%    Intake/Output Summary (Last 24 hours) at 10/09/2022 0836 Last data filed at 10/09/2022 0600 Gross per 24 hour  Intake 3283.45 ml  Output --  Net 3283.45 ml   Filed Weights   10/08/22 1708 10/08/22 1947  Weight: 97.5 kg 97.5 kg   Weight change:  Body mass index is 31.74 kg/m.   Physical Exam: General exam: Middle-aged African-American male.  Looks older for his stated age Skin: No rashes, lesions or ulcers. HEENT: Atraumatic, normocephalic, no obvious bleeding Lungs: Clear to auscultation bilaterally CVS: Regular rate and rhythm, no murmur GI/Abd soft, nontender,  nondistended, bowel sound present CNS: Alert, awake, oriented x 3 Psychiatry: Sad affect Extremities: Right foot with gangrenous changes as in picture   Data Review: I have personally reviewed the laboratory data and studies available.  F/u labs ordered Unresulted Labs (From admission, onward)     Start     Ordered   10/09/22 0700  Lactic acid, plasma  ONCE - STAT,   STAT        10/09/22 0643   10/09/22 0532  HIV Antibody (routine testing w rflx)  (HIV Antibody (Routine testing w reflex) panel)  Once,   R        10/09/22 0531   10/09/22 0532  CBC  (heparin)  Once,   R       Comments: Baseline for heparin therapy IF NOT ALREADY DRAWN.  Notify MD if PLT < 100 K.    10/09/22 0531   10/08/22 1719  Pathologist smear review  Once,   R        10/08/22 1719   10/08/22 1710  Culture, blood (Routine x 2)  BLOOD CULTURE X 2,   R,   Status:  Canceled      10/08/22 1709   Pending  Lactic acid, plasma  Now then every 2 hours,   R      Pending   Pending  Basic metabolic panel  Once,   R        Pending            Total time spent in review of labs and imaging, patient evaluation, formulation of plan, documentation and communication with family: 55 minutes  Signed, Lorin Glass, MD [facility] 10/09/2022

## 2022-10-09 NOTE — Progress Notes (Signed)
Pharmacy Antibiotic Note  Carlos [name] is a 47 y.o. male admitted on 10/08/2022 with wound infection RLE with gangrenous changes.  Pharmacy has been consulted for Vancomycin and Cefepime dosing.  AKI on admit, improved with hydration. Vancomycin 2gm IV loading dose given 5/3 pm.  Noted plan for right transtibial amputation on 5/5 am.  Plan: Continue Vancomycin with 1250 mg IV q24hrs. Goal AUC 400-550. Expected AUC: 451 SCr used: 1.43, Vd 0.5L/kg (BMI 31.7) Cefepime 2gm IV q12h > q8h with improved renal function. Also on Metronidazole 500 mg IV q12hrs. Follow renal function, culture data, post-op antibiotic plans.  Height: 5\' 9"  (175.3 cm) Weight: 97.5 kg (214 lb 15.2 oz) IBW/kg (Calculated) : 70.7  Temp (24hrs), Avg:99 F (37.2 C), Min:97.4 F (36.3 C), Max:102.6 F (39.2 C)  Recent Labs  Lab 10/08/22 1719 10/08/22 1945 10/08/22 2109 10/09/22 0118 10/09/22 0430 10/09/22 0803  WBC 27.7*  --   --   --   --  27.7*  CREATININE 2.05*  --  1.88*  --  1.57* 1.43*  LATICACIDVEN 3.3* 7.4*  --  2.5* 2.7* 3.1*    Estimated Creatinine Clearance: 72.7 mL/min (A) (by C-G formula based on SCr of 1.43 mg/dL (H)).    No Known Allergies  Antimicrobials this admission: Vancomycin 5/3 >> Cefepime 5/3>> Metronidazole 5/3 >>  Dose adjustments this admission: 5/4: Cefepime q12 > q8hrs for improved renal function  Microbiology results: 5/3 blood: GPC in one set, no growth < 24 hrs to date in 2nd set   Thank you for allowing pharmacy to be a part of this patient's care.  Dennie Fetters, RPh 10/09/2022 4:07 PM

## 2022-10-10 ENCOUNTER — Inpatient Hospital Stay (HOSPITAL_COMMUNITY): Payer: Medicaid Other | Admitting: Certified Registered Nurse Anesthetist

## 2022-10-10 ENCOUNTER — Other Ambulatory Visit: Payer: Self-pay

## 2022-10-10 ENCOUNTER — Encounter (HOSPITAL_COMMUNITY): Payer: Self-pay | Admitting: Family Medicine

## 2022-10-10 ENCOUNTER — Encounter (HOSPITAL_COMMUNITY)
Admission: EM | Disposition: A | Discharge status: Rehabilitation Facility | Payer: Self-pay | Source: Home / Self Care | Attending: Internal Medicine

## 2022-10-10 DIAGNOSIS — M726 Necrotizing fasciitis: Secondary | ICD-10-CM

## 2022-10-10 DIAGNOSIS — E1152 Type 2 diabetes mellitus with diabetic peripheral angiopathy with gangrene: Secondary | ICD-10-CM

## 2022-10-10 HISTORY — PX: AMPUTATION: SHX166

## 2022-10-10 LAB — CBC WITH DIFFERENTIAL/PLATELET
Abs Immature Granulocytes: 1.33 10*3/uL — ABNORMAL HIGH (ref 0.00–0.07)
Basophils Absolute: 0.1 10*3/uL (ref 0.0–0.1)
Basophils Relative: 0 %
Eosinophils Absolute: 0 10*3/uL (ref 0.0–0.5)
Eosinophils Relative: 0 %
HCT: 29.9 % — ABNORMAL LOW (ref 39.0–52.0)
Hemoglobin: 9.7 g/dL — ABNORMAL LOW (ref 13.0–17.0)
Immature Granulocytes: 5 %
Lymphocytes Relative: 6 %
Lymphs Abs: 1.6 10*3/uL (ref 0.7–4.0)
MCH: 29.2 pg (ref 26.0–34.0)
MCHC: 32.4 g/dL (ref 30.0–36.0)
MCV: 90.1 fL (ref 80.0–100.0)
Monocytes Absolute: 1.8 10*3/uL — ABNORMAL HIGH (ref 0.1–1.0)
Monocytes Relative: 6 %
Neutro Abs: 23.8 10*3/uL — ABNORMAL HIGH (ref 1.7–7.7)
Neutrophils Relative %: 83 %
Platelets: 148 10*3/uL — ABNORMAL LOW (ref 150–400)
RBC: 3.32 MIL/uL — ABNORMAL LOW (ref 4.22–5.81)
RDW: 12.8 % (ref 11.5–15.5)
WBC: 28.7 10*3/uL — ABNORMAL HIGH (ref 4.0–10.5)
nRBC: 0.1 % (ref 0.0–0.2)

## 2022-10-10 LAB — BASIC METABOLIC PANEL
Anion gap: 7 (ref 5–15)
BUN: 16 mg/dL (ref 6–20)
CO2: 25 mmol/L (ref 22–32)
Calcium: 7.6 mg/dL — ABNORMAL LOW (ref 8.9–10.3)
Chloride: 94 mmol/L — ABNORMAL LOW (ref 98–111)
Creatinine, Ser: 1.28 mg/dL — ABNORMAL HIGH (ref 0.61–1.24)
GFR, Estimated: >60 mL/min (ref >60)
Glucose, Bld: 210 mg/dL — ABNORMAL HIGH (ref 70–99)
Potassium: 4 mmol/L (ref 3.5–5.1)
Sodium: 126 mmol/L — ABNORMAL LOW (ref 135–145)

## 2022-10-10 LAB — GLUCOSE, CAPILLARY
Glucose-Capillary: 230 mg/dL — ABNORMAL HIGH (ref 70–99)
Glucose-Capillary: 190 mg/dL — ABNORMAL HIGH (ref 70–99)
Glucose-Capillary: 198 mg/dL — ABNORMAL HIGH (ref 70–99)
Glucose-Capillary: 235 mg/dL — ABNORMAL HIGH (ref 70–99)
Glucose-Capillary: 335 mg/dL — ABNORMAL HIGH (ref 70–99)

## 2022-10-10 LAB — LACTIC ACID, PLASMA: Lactic Acid, Venous: 1.8 mmol/L (ref 0.5–1.9)

## 2022-10-10 LAB — AEROBIC/ANAEROBIC CULTURE W GRAM STAIN (SURGICAL/DEEP WOUND)

## 2022-10-10 LAB — SURGICAL PCR SCREEN
MRSA, PCR: NEGATIVE
Staphylococcus aureus: POSITIVE — AB

## 2022-10-10 SURGERY — AMPUTATION BELOW KNEE
Anesthesia: Regional | Site: Knee | Laterality: Right

## 2022-10-10 MED ORDER — LACTATED RINGERS IV SOLN: INTRAVENOUS | Status: DC

## 2022-10-10 MED ORDER — POVIDONE-IODINE 10 % EX SWAB
2.0000 | Freq: Once | CUTANEOUS | Status: AC
  Administered 2022-10-10: 2 via TOPICAL

## 2022-10-10 MED ORDER — ALUM & MAG HYDROXIDE-SIMETH 200-200-20 MG/5ML PO SUSP: 15.0000 mL | ORAL | Status: DC | PRN

## 2022-10-10 MED ORDER — VITAMIN C 500 MG PO TABS
1000.0000 mg | ORAL_TABLET | Freq: Every day | ORAL | Status: DC
  Administered 2022-10-11: 1000 mg via ORAL
  Filled 2022-10-10 (×4): qty 2

## 2022-10-10 MED ORDER — MIDAZOLAM HCL 2 MG/2ML IJ SOLN
INTRAMUSCULAR | Status: AC
  Administered 2022-10-10: 2 mg
  Filled 2022-10-10: qty 2

## 2022-10-10 MED ORDER — 0.9 % SODIUM CHLORIDE (POUR BTL) OPTIME
TOPICAL | Status: DC | PRN
  Administered 2022-10-10: 1000 mL

## 2022-10-10 MED ORDER — POTASSIUM CHLORIDE CRYS ER 20 MEQ PO TBCR: 20.0000 meq | EXTENDED_RELEASE_TABLET | Freq: Every day | ORAL | Status: DC | PRN

## 2022-10-10 MED ORDER — ACETAMINOPHEN 325 MG PO TABS: 325.0000 mg | ORAL_TABLET | Freq: Four times a day (QID) | ORAL | Status: DC | PRN

## 2022-10-10 MED ORDER — PHENYLEPHRINE HCL-NACL 20-0.9 MG/250ML-% IV SOLN
INTRAVENOUS | Status: DC | PRN
  Administered 2022-10-10: 50 ug/min via INTRAVENOUS

## 2022-10-10 MED ORDER — CHLORHEXIDINE GLUCONATE 4 % EX SOLN: 60.0000 mL | Freq: Once | CUTANEOUS | Status: DC

## 2022-10-10 MED ORDER — ORAL CARE MOUTH RINSE: 15.0000 mL | Freq: Once | OROMUCOSAL | Status: AC

## 2022-10-10 MED ORDER — PHENYLEPHRINE 80 MCG/ML (10ML) SYRINGE FOR IV PUSH (FOR BLOOD PRESSURE SUPPORT)
PREFILLED_SYRINGE | INTRAVENOUS | Status: DC | PRN
  Administered 2022-10-10 (×2): 80 ug via INTRAVENOUS
  Administered 2022-10-10: 160 ug via INTRAVENOUS

## 2022-10-10 MED ORDER — FENTANYL CITRATE (PF) 100 MCG/2ML IJ SOLN: 25.0000 ug | INTRAMUSCULAR | Status: DC | PRN

## 2022-10-10 MED ORDER — ROPIVACAINE HCL 5 MG/ML IJ SOLN
INTRAMUSCULAR | Status: DC | PRN
  Administered 2022-10-10: 40 mL via PERINEURAL

## 2022-10-10 MED ORDER — FENTANYL CITRATE (PF) 100 MCG/2ML IJ SOLN: 50.0000 ug | Freq: Once | INTRAMUSCULAR | Status: DC

## 2022-10-10 MED ORDER — DEXAMETHASONE SODIUM PHOSPHATE 10 MG/ML IJ SOLN
INTRAMUSCULAR | Status: DC | PRN
  Administered 2022-10-10: 5 mg via INTRAVENOUS

## 2022-10-10 MED ORDER — LABETALOL HCL 5 MG/ML IV SOLN: 10.0000 mg | INTRAVENOUS | Status: DC | PRN

## 2022-10-10 MED ORDER — CEFAZOLIN SODIUM-DEXTROSE 2-4 GM/100ML-% IV SOLN: 2.0000 g | INTRAVENOUS | Status: DC

## 2022-10-10 MED ORDER — OXYCODONE HCL 5 MG PO TABS: 10.0000 mg | ORAL_TABLET | ORAL | Status: DC | PRN

## 2022-10-10 MED ORDER — FENTANYL CITRATE (PF) 100 MCG/2ML IJ SOLN
INTRAMUSCULAR | Status: AC
  Administered 2022-10-10: 50 ug
  Filled 2022-10-10: qty 2

## 2022-10-10 MED ORDER — ONDANSETRON HCL 4 MG/2ML IJ SOLN
INTRAMUSCULAR | Status: DC | PRN
  Administered 2022-10-10: 4 mg via INTRAVENOUS

## 2022-10-10 MED ORDER — ONDANSETRON HCL 4 MG/2ML IJ SOLN
4.0000 mg | Freq: Four times a day (QID) | INTRAMUSCULAR | Status: DC | PRN
  Filled 2022-10-10: qty 2

## 2022-10-10 MED ORDER — PROPOFOL 10 MG/ML IV BOLUS
INTRAVENOUS | Status: DC | PRN
  Administered 2022-10-10: 200 mg via INTRAVENOUS

## 2022-10-10 MED ORDER — CEFAZOLIN SODIUM-DEXTROSE 2-4 GM/100ML-% IV SOLN: 2.0000 g | Freq: Three times a day (TID) | INTRAVENOUS | Status: DC

## 2022-10-10 MED ORDER — LIDOCAINE 2% (20 MG/ML) 5 ML SYRINGE
INTRAMUSCULAR | Status: DC | PRN
  Administered 2022-10-10: 80 mg via INTRAVENOUS

## 2022-10-10 MED ORDER — PROPOFOL 10 MG/ML IV BOLUS
INTRAVENOUS | Status: AC
  Filled 2022-10-10: qty 20

## 2022-10-10 MED ORDER — GUAIFENESIN-DM 100-10 MG/5ML PO SYRP: 15.0000 mL | ORAL_SOLUTION | ORAL | Status: DC | PRN

## 2022-10-10 MED ORDER — OXYCODONE HCL 5 MG PO TABS: 5.0000 mg | ORAL_TABLET | ORAL | Status: DC | PRN

## 2022-10-10 MED ORDER — ALBUMIN HUMAN 5 % IV SOLN: INTRAVENOUS | Status: DC | PRN

## 2022-10-10 MED ORDER — PANTOPRAZOLE SODIUM 40 MG PO TBEC
40.0000 mg | DELAYED_RELEASE_TABLET | Freq: Every day | ORAL | Status: DC
  Administered 2022-10-11: 40 mg via ORAL
  Filled 2022-10-10 (×4): qty 1

## 2022-10-10 MED ORDER — MAGNESIUM CITRATE PO SOLN
1.0000 | Freq: Once | ORAL | Status: DC | PRN
  Filled 2022-10-10: qty 296

## 2022-10-10 MED ORDER — JUVEN PO PACK
1.0000 | PACK | Freq: Two times a day (BID) | ORAL | Status: DC
  Administered 2022-10-10 – 2022-10-12 (×3): 1 via ORAL
  Filled 2022-10-10 (×4): qty 1

## 2022-10-10 MED ORDER — SODIUM CHLORIDE 0.9 % IV SOLN: INTRAVENOUS | Status: DC

## 2022-10-10 MED ORDER — METOPROLOL TARTRATE 5 MG/5ML IV SOLN: 2.0000 mg | INTRAVENOUS | Status: DC | PRN

## 2022-10-10 MED ORDER — ZINC SULFATE 220 (50 ZN) MG PO CAPS
220.0000 mg | ORAL_CAPSULE | Freq: Every day | ORAL | Status: DC
  Administered 2022-10-11: 220 mg via ORAL
  Filled 2022-10-10 (×4): qty 1

## 2022-10-10 MED ORDER — CLONIDINE HCL (ANALGESIA) 100 MCG/ML EP SOLN
EPIDURAL | Status: DC | PRN
  Administered 2022-10-10: 100 ug

## 2022-10-10 MED ORDER — ACETAMINOPHEN 10 MG/ML IV SOLN: 1000.0000 mg | Freq: Once | INTRAVENOUS | Status: DC | PRN

## 2022-10-10 MED ORDER — EPHEDRINE SULFATE (PRESSORS) 50 MG/ML IJ SOLN
INTRAMUSCULAR | Status: DC | PRN
  Administered 2022-10-10: 10 mg via INTRAVENOUS

## 2022-10-10 MED ORDER — HYDROMORPHONE HCL 1 MG/ML IJ SOLN: 0.5000 mg | INTRAMUSCULAR | Status: DC | PRN

## 2022-10-10 MED ORDER — FENTANYL CITRATE (PF) 250 MCG/5ML IJ SOLN
INTRAMUSCULAR | Status: AC
  Filled 2022-10-10: qty 5

## 2022-10-10 MED ORDER — PHENOL 1.4 % MT LIQD: 1.0000 | OROMUCOSAL | Status: DC | PRN

## 2022-10-10 MED ORDER — CHLORHEXIDINE GLUCONATE 0.12 % MT SOLN
OROMUCOSAL | Status: AC
  Administered 2022-10-10: 15 mL via OROMUCOSAL
  Filled 2022-10-10: qty 15

## 2022-10-10 MED ORDER — MIDAZOLAM HCL 2 MG/2ML IJ SOLN
INTRAMUSCULAR | Status: AC
  Filled 2022-10-10: qty 2

## 2022-10-10 MED ORDER — MAGNESIUM SULFATE 2 GM/50ML IV SOLN: 2.0000 g | Freq: Every day | INTRAVENOUS | Status: DC | PRN

## 2022-10-10 MED ORDER — FENTANYL CITRATE (PF) 250 MCG/5ML IJ SOLN
INTRAMUSCULAR | Status: DC | PRN
  Administered 2022-10-10 (×4): 25 ug via INTRAVENOUS

## 2022-10-10 MED ORDER — MIDAZOLAM HCL 2 MG/2ML IJ SOLN: 2.0000 mg | Freq: Once | INTRAMUSCULAR | Status: DC

## 2022-10-10 MED ORDER — POLYETHYLENE GLYCOL 3350 17 G PO PACK: 17.0000 g | PACK | Freq: Every day | ORAL | Status: DC | PRN

## 2022-10-10 MED ORDER — METRONIDAZOLE 500 MG/100ML IV SOLN
INTRAVENOUS | Status: AC
  Filled 2022-10-10: qty 100

## 2022-10-10 MED ORDER — HYDRALAZINE HCL 20 MG/ML IJ SOLN: 5.0000 mg | INTRAMUSCULAR | Status: DC | PRN

## 2022-10-10 MED ORDER — BISACODYL 5 MG PO TBEC: 5.0000 mg | DELAYED_RELEASE_TABLET | Freq: Every day | ORAL | Status: DC | PRN

## 2022-10-10 MED ORDER — DOCUSATE SODIUM 100 MG PO CAPS
100.0000 mg | ORAL_CAPSULE | Freq: Every day | ORAL | Status: DC
  Administered 2022-10-11: 100 mg via ORAL
  Filled 2022-10-10 (×3): qty 1

## 2022-10-10 MED ORDER — CHLORHEXIDINE GLUCONATE 0.12 % MT SOLN: 15.0000 mL | Freq: Once | OROMUCOSAL | Status: AC

## 2022-10-10 SURGICAL SUPPLY — 47 items
BAG COUNTER SPONGE SURGICOUNT (BAG) IMPLANT
BAG SPNG CNTER NS LX DISP (BAG)
BIT DRILL 3.2XOCPTL (BIT) ×2 IMPLANT
BIT DRL 3.2XOCPTL (BIT)
BLADE SAW RECIP 87.9 MT (BLADE) ×2 IMPLANT
BLADE SURG 21 STRL SS (BLADE) ×2 IMPLANT
BNDG CMPR 5X6 CHSV STRCH STRL (GAUZE/BANDAGES/DRESSINGS)
BNDG COHESIVE 6X5 TAN ST LF (GAUZE/BANDAGES/DRESSINGS) IMPLANT
CANISTER WOUND CARE 500ML ATS (WOUND CARE) ×2 IMPLANT
CNTNR URN SCR LID CUP LEK RST (MISCELLANEOUS) IMPLANT
CONT SPEC 4OZ STRL OR WHT (MISCELLANEOUS) ×1
COVER SURGICAL LIGHT HANDLE (MISCELLANEOUS) ×2 IMPLANT
CUFF TOURN SGL QUICK 34 (TOURNIQUET CUFF) ×1
CUFF TRNQT CYL 34X4.125X (TOURNIQUET CUFF) ×2 IMPLANT
DRAPE DERMATAC (DRAPES) IMPLANT
DRAPE INCISE IOBAN 66X45 STRL (DRAPES) ×2 IMPLANT
DRAPE U-SHAPE 47X51 STRL (DRAPES) ×2 IMPLANT
DRESSING PREVENA PLUS CUSTOM (GAUZE/BANDAGES/DRESSINGS) ×2 IMPLANT
DRESSING VERAFLO CLEANS CC MED (GAUZE/BANDAGES/DRESSINGS) IMPLANT
DRILL BIT (BIT)
DRSG PREVENA PLUS CUSTOM (GAUZE/BANDAGES/DRESSINGS) ×1
DRSG VERAFLO CLEANSE CC MED (GAUZE/BANDAGES/DRESSINGS) ×1
DURAPREP 26ML APPLICATOR (WOUND CARE) ×2 IMPLANT
ELECT REM PT RETURN 9FT ADLT (ELECTROSURGICAL) ×1
ELECTRODE REM PT RTRN 9FT ADLT (ELECTROSURGICAL) ×2 IMPLANT
GLOVE BIOGEL PI IND STRL 9 (GLOVE) ×2 IMPLANT
GLOVE SURG ORTHO 9.0 STRL STRW (GLOVE) ×2 IMPLANT
GOWN STRL REUS W/ TWL XL LVL3 (GOWN DISPOSABLE) ×4 IMPLANT
GOWN STRL REUS W/TWL XL LVL3 (GOWN DISPOSABLE) ×2
GRAFT SKIN WND MICRO 38 (Tissue) IMPLANT
KIT BASIN OR (CUSTOM PROCEDURE TRAY) ×2 IMPLANT
KIT TURNOVER KIT B (KITS) ×2 IMPLANT
MANIFOLD NEPTUNE II (INSTRUMENTS) ×2 IMPLANT
NS IRRIG 1000ML POUR BTL (IV SOLUTION) ×2 IMPLANT
PACK ORTHO EXTREMITY (CUSTOM PROCEDURE TRAY) ×2 IMPLANT
PAD ARMBOARD 7.5X6 YLW CONV (MISCELLANEOUS) ×2 IMPLANT
PREVENA RESTOR ARTHOFORM 46X30 (CANNISTER) ×2 IMPLANT
SPONGE T-LAP 18X18 ~~LOC~~+RFID (SPONGE) IMPLANT
STAPLER VISISTAT 35W (STAPLE) IMPLANT
STOCKINETTE IMPERVIOUS LG (DRAPES) ×2 IMPLANT
SUT ETHILON 2 0 PSLX (SUTURE) IMPLANT
SUT SILK 2 0 (SUTURE) ×1
SUT SILK 2-0 18XBRD TIE 12 (SUTURE) ×2 IMPLANT
SUT VIC AB 1 CTX 27 (SUTURE) ×4 IMPLANT
TOWEL GREEN STERILE (TOWEL DISPOSABLE) ×2 IMPLANT
TUBE CONNECTING 12X1/4 (SUCTIONS) ×2 IMPLANT
YANKAUER SUCT BULB TIP NO VENT (SUCTIONS) ×2 IMPLANT

## 2022-10-10 NOTE — Transfer of Care (Signed)
Immediate Anesthesia Transfer of Care Note  Patient: Carlos [name]  Procedure(s) Performed: AMPUTATION BELOW KNEE (Right: Knee)  Patient Location: PACU  Anesthesia Type:General and Regional  Level of Consciousness: sedated  Airway & Oxygen Therapy: Patient Spontanous Breathing and Patient connected to face mask oxygen  Post-op Assessment: Report given to RN and Post -op Vital signs reviewed and stable  Post vital signs: Reviewed and stable  Last Vitals:  Vitals Value Taken Time  BP 103/64 10/10/22 1207  Temp    Pulse 103 10/10/22 1210  Resp 23 10/10/22 1210  SpO2 100 % 10/10/22 1210  Vitals shown include unvalidated device data.  Last Pain:  Vitals:   10/10/22 1050  TempSrc:   PainSc: 0-No pain         Complications: No notable events documented.

## 2022-10-10 NOTE — Anesthesia Procedure Notes (Signed)
Anesthesia Regional Block: Adductor canal block   Pre-Anesthetic Checklist: , timeout performed,  Correct Patient, Correct Site, Correct Laterality,  Correct Procedure, Correct Position, site marked,  Risks and benefits discussed,  Surgical consent,  Pre-op evaluation,  At surgeon's request and post-op pain management  Laterality: Right  Prep: Dura Prep       Needles:  Injection technique: Single-shot  Needle Type: Echogenic Stimulator Needle     Needle Length: 10cm  Needle Gauge: 20     Additional Needles:   Procedures:,,,, ultrasound used (permanent image in chart),,    Narrative:  Start time: 10/10/2022 10:44 AM End time: 10/10/2022 10:47 AM Injection made incrementally with aspirations every 5 mL.  Performed by: Personally  Anesthesiologist: Atilano Median, DO  Additional Notes: Patient identified. Risks/Benefits/Options discussed with patient including but not limited to bleeding, infection, nerve damage, failed block, incomplete pain control. Patient expressed understanding and wished to proceed. All questions were answered. Sterile technique was used throughout the entire procedure. Please see nursing notes for vital signs. Aspirated in 5cc intervals with injection for negative confirmation. Patient was given instructions on fall risk and not to get out of bed. All questions and concerns addressed with instructions to call with any issues or inadequate analgesia.

## 2022-10-10 NOTE — Anesthesia Procedure Notes (Signed)
Procedure Name: LMA Insertion Date/Time: 10/10/2022 11:12 AM  Performed by: Dairl Ponder, CRNAPre-anesthesia Checklist: Patient identified, Emergency Drugs available, Suction available and Patient being monitored Patient Re-evaluated:Patient Re-evaluated prior to induction Oxygen Delivery Method: Circle System Utilized Preoxygenation: Pre-oxygenation with 100% oxygen Induction Type: IV induction Ventilation: Mask ventilation without difficulty LMA: LMA inserted LMA Size: 4.0 Number of attempts: 1 Airway Equipment and Method: Bite block Placement Confirmation: positive ETCO2 Tube secured with: Tape Dental Injury: Teeth and Oropharynx as per pre-operative assessment

## 2022-10-10 NOTE — Anesthesia Procedure Notes (Signed)
Anesthesia Regional Block: Popliteal block   Pre-Anesthetic Checklist: , timeout performed,  Correct Patient, Correct Site, Correct Laterality,  Correct Procedure, Correct Position, site marked,  Risks and benefits discussed,  Surgical consent,  Pre-op evaluation,  At surgeon's request and post-op pain management  Laterality: Right  Prep: Dura Prep       Needles:  Injection technique: Single-shot  Needle Type: Echogenic Stimulator Needle     Needle Length: 10cm  Needle Gauge: 20     Additional Needles:   Procedures:,,,, ultrasound used (permanent image in chart),,    Narrative:  Start time: 10/10/2022 10:49 AM End time: 10/10/2022 10:52 AM Injection made incrementally with aspirations every 5 mL.  Performed by: Personally  Anesthesiologist: Atilano Median, DO  Additional Notes: Patient identified. Risks/Benefits/Options discussed with patient including but not limited to bleeding, infection, nerve damage, failed block, incomplete pain control. Patient expressed understanding and wished to proceed. All questions were answered. Sterile technique was used throughout the entire procedure. Please see nursing notes for vital signs. Aspirated in 5cc intervals with injection for negative confirmation. Patient was given instructions on fall risk and not to get out of bed. All questions and concerns addressed with instructions to call with any issues or inadequate analgesia.

## 2022-10-10 NOTE — Interval H&P Note (Signed)
History and Physical Interval Note:  10/10/2022 10:43 AM  Carlos [name]  has presented today for surgery, with the diagnosis of Right Gangrene Foot.  The various methods of treatment have been discussed with the patient and family. After consideration of risks, benefits and other options for treatment, the patient has consented to  Procedure(s): AMPUTATION BELOW KNEE (Right) as a surgical intervention.  The patient's history has been reviewed, patient examined, no change in status, stable for surgery.  I have reviewed the patient's chart and labs.  Questions were answered to the patient's satisfaction.     [name]

## 2022-10-10 NOTE — Progress Notes (Signed)
Requested to offer second opinion regarding upcoming surgery on the right foot Patient admitted with sepsis and noted to have gas extending into the hindfoot following infection.  Patient has been on antibiotics and the sepsis has improved Plain radiographs and CT scan reviewed.  Relatively aggressive gas-forming infection involves the entire right foot. On examination no tissue crepitus yet above the malleoli but the toes on the right foot are black. I agree with Dr. Lajoyce Corners that below-knee amputation is indicated.  I discussed with the patient at length the risk and benefits of intervention and no intervention as well as partial intervention.  In general we need to do the amputation at a level that is above the level of the ascending infection.  Also the bigger and more robust soft tissue coverage is over the stump the better he will do with ambulation. It is my opinion that he undergo the recommended procedure for the recommended reasons to Dr. Lajoyce Corners discussed with him.

## 2022-10-10 NOTE — Anesthesia Postprocedure Evaluation (Signed)
Anesthesia Post Note  Patient: Engineer, mining  Procedure(s) Performed: AMPUTATION BELOW KNEE (Right: Knee)     Patient location during evaluation: PACU Anesthesia Type: Regional and General Level of consciousness: awake and alert Pain management: pain level controlled Vital Signs Assessment: post-procedure vital signs reviewed and stable Respiratory status: spontaneous breathing, nonlabored ventilation, respiratory function stable and patient connected to nasal cannula oxygen Cardiovascular status: blood pressure returned to baseline and stable Postop Assessment: no apparent nausea or vomiting Anesthetic complications: no   No notable events documented.  Last Vitals:  Vitals:   10/10/22 1300 10/10/22 1305  BP: 112/75 118/75  Pulse: 95 99  Resp: 16 19  Temp:  36.8 C  SpO2: 98% 99%    Last Pain:  Vitals:   10/10/22 1300  TempSrc:   PainSc: 0-No pain                 Earl Lites P Krystel Fletchall

## 2022-10-10 NOTE — Progress Notes (Signed)
Inpatient Rehab Admissions Coordinator:  Consult received. Await therapy evaluations to help determine appropriate rehab venue.   Wolfgang Phoenix, MS, CCC-SLP Admissions Coordinator [phone]

## 2022-10-10 NOTE — Progress Notes (Signed)
PROGRESS NOTE  Carlos [name]  DOB: 10-14-1975  PCP: System, Provider Not In ZOX:[id number]  DOA: 10/08/2022  LOS: 2 days  Hospital Day: 3  Brief narrative: Carlos [name] is a 47 y.o. male with PMH significant for untreated DM2, gastroparesis, CVA, MRSA cellulitis 2021 5/3, patient presented to the ED with complaint of progressive swelling and blackening of right big toe for 3 weeks. 2 weeks ago, hewas seen at urgent care center and was prescribed a course of doxycycline which hecould not tolerate because of nausea and vomiting. His wound continued to grow. He also started having fever, chills, and hence came to the ED.  In the ED, patient had a fever of 102.6, heart rate 126, blood pressure in 90s, breathing on room air She was noted to have edematous right great toe with weeping, foul order and eschar Initial labs with WC count 27.7, hemoglobin 10.1, lactic acid 3.3, blood glucose elevated over 500, sodium 123, creatinine 2.05, troponin 160 3.  Repeat lactic acid elevated up to 7.4, subsequently down to 2.5 Blood culture was collected Urinalysis showed cloudy yellow urine negative for leukocytes, negative nitrite, rare bacteria  X-ray right foot showed numerous pockets of air in the soft tissue.  Right foot and right ankle suggestive of possible gas gangrene.  No fracture or dislocation seen.  CT right foot with contrast showed  1. Extensive subcutaneous and intramuscular gas within the intrinsic musculature of the right foot as well as the terminal visualized right lower extremity musculature. The findings are in keeping with aggressive infection such as necrotizing fasciitis. The extent of disease extends beyond the cephalad margin of this examination. 2. Gas within the first metatarsal head, as well as the proximal and distal phalanx of the great toe suspicious for changes of osteomyelitis. 3. Extensive subcutaneous edema within the visualized right foot. No drainable fluid collection. 4.  Advanced vascular calcifications.   Patient was given IV fluid bolus, broad-spectrum IV antibiotics Orthopedics Dr. Linna Caprice was consulted  Admitted to [facility]  Subjective: Patient was seen and examined this afternoon.*** Underwent right BKA by Dr. Lajoyce Corners today.  Assessment and plan: Severe sepsis - POA Necrotizing fasciitis of right lower extremity Presented with worsening right foot wound.  Imaging as above showing gas gangrene as well as necrotizing fasciitis Seen by orthopedics. 5/5, underwent right BKA by Dr. Lajoyce Corners.   Continue to monitor temperature and WBC count. No recurrence of fever but WC count remains elevated.  Repeat lactic acid level in a.m. to document normalization. Recent Labs  Lab 10/08/22 1719 10/08/22 1945 10/09/22 0118 10/09/22 0430 10/09/22 0803 10/10/22 0150  WBC 27.7*  --   --   --  27.7* 28.7*  LATICACIDVEN 3.3* 7.4* 2.5* 2.7* 3.1* 1.8    AKI Lactic acidosis Due to tissue hypoperfusion and renal hypoperfusion. Creatinine was normal in March 2023.  Presented with creatinine elevated 2.05.  Gradually downtrending with IV hydration. Recent Labs    10/08/22 1719 10/08/22 2109 10/09/22 0430 10/09/22 0803 10/10/22 0150  BUN 19 23* 23* 19 16  CREATININE 2.05* 1.88* 1.57* 1.43* 1.28*     Acute hyponatremia baseline sodium level was normal.  Presented with low level of 123.   Serum osmolality is normal but urine osmolality is elevated.   Sodium level fluctuating, at 126 today.  Continue to monitor Recent Labs  Lab 10/08/22 1719 10/08/22 2109 10/09/22 0430 10/09/22 0803 10/10/22 0150  NA 123* 123* 125* 129* 126*    Elevated troponin Demand ischemia likely secondary to  sepsis/infection.  No active anginal symptoms. Recent Labs    10/08/22 1728  TROPONINIHS 163*    Type 2 diabetes mellitus uncontrolled with hyperglycemia A1c 10.4 on 10/08/2022.  Presented with a blood sugar level elevated over 500 PTA not on any meds. Currently on Lantus 20  units nightly, sliding scale insulin with Accu-Cheks Recent Labs  Lab 10/09/22 1946 10/09/22 2314 10/10/22 0407 10/10/22 0723 10/10/22 1233  GLUCAP 216* 185* 230* 190* 198*    Diabetic gastroparesis PTA not on any meds.  He states he did not have any symptoms prior to taking doxycycline.   H/o CVA Not on any meds    Mobility: PT eval postprocedure  Goals of care   Code Status: Full Code    DVT prophylaxis:  heparin injection 5,000 Units Start: 10/09/22 2200   Antimicrobials: IV cefepime, IV vancomycin, IV Flagyl Fluid: Normal saline at 125 mill per hour -continue today. Consultants: Orthopedics Family Communication: None at bedside  Status: Inpatient Level of care:  Telemetry Medical   Patient from: Home Anticipated d/c to: Pending clinical course Needs to continue in-hospital care:  Needs surgical intervention    Diet:  Diet Order             Diet NPO time specified  Diet effective midnight                   Scheduled Meds:  chlorhexidine  60 mL Topical Once   [MAR Hold] heparin  5,000 Units Subcutaneous Q8H   [MAR Hold] insulin aspart  0-15 Units Subcutaneous Q4H   [MAR Hold] insulin glargine-yfgn  22 Units Subcutaneous QHS    PRN meds: acetaminophen, [MAR Hold] acetaminophen **OR** [MAR Hold] acetaminophen, fentaNYL (SUBLIMAZE) injection, fentaNYL (SUBLIMAZE) injection, [MAR Hold]  HYDROmorphone (DILAUDID) injection, [MAR Hold]  morphine injection, [MAR Hold] ondansetron (ZOFRAN) IV, [MAR Hold] oxyCODONE, [MAR Hold] senna-docusate   Infusions:   sodium chloride 125 mL/hr at 10/10/22 0200   acetaminophen     [MAR Hold] ceFEPime (MAXIPIME) IV 2 g (10/10/22 1027)   lactated ringers 10 mL/hr at 10/10/22 1027   [MAR Hold] metronidazole 0 mg (10/10/22 0011)   [MAR Hold] vancomycin Stopped (10/09/22 2230)    Antimicrobials: Anti-infectives (From admission, onward)    Start     Dose/Rate Route Frequency Ordered Stop   10/10/22 1045  ceFAZolin  (ANCEF) IVPB 2g/100 mL premix  Status:  Discontinued        2 g 200 mL/hr over 30 Minutes Intravenous On call to O.R. 10/10/22 0947 10/10/22 1013   10/10/22 0955  metroNIDAZOLE (FLAGYL) 500 MG/100ML IVPB       Note to Pharmacy: Launa Flight M: cabinet override      10/10/22 0955 10/10/22 1115   10/09/22 2100  [MAR Hold]  vancomycin (VANCOREADY) IVPB 1250 mg/250 mL        (MAR Hold since Sun 10/10/2022 at 0949.Hold Reason: Transfer to a Procedural area)   1,250 mg 166.7 mL/hr over 90 Minutes Intravenous Every 24 hours 10/09/22 1604     10/09/22 1800  [MAR Hold]  ceFEPIme (MAXIPIME) 2 g in sodium chloride 0.9 % 100 mL IVPB        (MAR Hold since Sun 10/10/2022 at 0949.Hold Reason: Transfer to a Procedural area)   2 g 200 mL/hr over 30 Minutes Intravenous Every 8 hours 10/09/22 1616     10/09/22 0600  ceFEPIme (MAXIPIME) 2 g in sodium chloride 0.9 % 100 mL IVPB  Status:  Discontinued  2 g 200 mL/hr over 30 Minutes Intravenous Every 12 hours 10/08/22 2103 10/09/22 1616   10/08/22 2103  vancomycin variable dose per unstable renal function (pharmacist dosing)  Status:  Discontinued         Does not apply See admin instructions 10/08/22 2103 10/09/22 1604   10/08/22 2015  vancomycin (VANCOREADY) IVPB 2000 mg/400 mL        2,000 mg 200 mL/hr over 120 Minutes Intravenous  Once 10/08/22 2005 10/08/22 2250   10/08/22 1800  ceFEPIme (MAXIPIME) 2 g in sodium chloride 0.9 % 100 mL IVPB        2 g 200 mL/hr over 30 Minutes Intravenous  Once 10/08/22 1748 10/08/22 1916   10/08/22 1745  [MAR Hold]  metroNIDAZOLE (FLAGYL) IVPB 500 mg        (MAR Hold since Sun 10/10/2022 at 0949.Hold Reason: Transfer to a Procedural area)   500 mg 100 mL/hr over 60 Minutes Intravenous Every 12 hours 10/08/22 1736 10/15/22 2159       Nutritional status:  Body mass index is 31.74 kg/m.       {Tip this will not be part of the note when signed Body mass index is 31.74 kg/m. , ,   (Optional):26781}   Objective: Vitals:   10/10/22 1300 10/10/22 1305  BP: 112/75 118/75  Pulse: 95 99  Resp: 16 19  Temp:  98.2 F (36.8 C)  SpO2: 98% 99%    Intake/Output Summary (Last 24 hours) at 10/10/2022 1321 Last data filed at 10/10/2022 1156 Gross per 24 hour  Intake 3219.32 ml  Output 25 ml  Net 3194.32 ml    Filed Weights   10/08/22 1708 10/08/22 1947  Weight: 97.5 kg 97.5 kg   Weight change:  Body mass index is 31.74 kg/m.   Physical Exam: General exam: Middle-aged African-American male.  Looks older for his stated age Skin: No rashes, lesions or ulcers. HEENT: Atraumatic, normocephalic, no obvious bleeding Lungs: Clear to auscultation bilaterally CVS: Regular rate and rhythm, no murmur GI/Abd soft, nontender, nondistended, bowel sound present CNS: Alert, awake, oriented x 3 Psychiatry: Sad affect Extremities: Right leg new BKA status***  Data Review: I have personally reviewed the laboratory data and studies available.  F/u labs ordered Unresulted Labs (From admission, onward)     Start     Ordered   10/10/22 1134  Aerobic/Anaerobic Culture w Gram Stain (surgical/deep wound)  RELEASE UPON ORDERING,   TIMED       Comments: Specimen A: Phone [phone] Immunocompromised?  No  Antibiotic Treatment:  flagyl Is the patient on airborne/droplet precautions? No Clinical History:  Right Gangrene Foot Special Instructions:  none Specimen Disposition:  Microbiology     10/10/22 1134   10/10/22 0500  CBC with Differential/Platelet  Daily,   R      10/09/22 0848   10/10/22 0500  Basic metabolic panel  Daily,   R      10/09/22 0848   10/08/22 1719  Pathologist smear review  Once,   R        10/08/22 1719   Pending  Lactic acid, plasma  Now then every 2 hours,   R      Pending   Signed and Held  Basic metabolic panel  Daily,   R     Question:  Specimen collection method  Answer:  Lab=Lab collect   Signed and Held   Signed and Held  CBC  Daily,   R  Question:  Specimen collection method  Answer:  Lab=Lab collect   Signed and Held            Total time spent in review of labs and imaging, patient evaluation, formulation of plan, documentation and communication with family: 45 minutes  Signed, Lorin Glass, MD [facility] 10/10/2022

## 2022-10-10 NOTE — Op Note (Signed)
10/10/2022  12:00 PM  PATIENT:  Carlos [name]    PRE-OPERATIVE DIAGNOSIS:  Right Gangrene Foot  POST-OPERATIVE DIAGNOSIS: Gangrene right foot with necrotizing fasciitis and abscess extending to the mid calf.  PROCEDURE:  AMPUTATION BELOW KNEE Application of Kerecis micro graft 38 cm  Tissue and abscess sent for cultures. Application cleanse choice wound VAC sponge x 2 Application of Vive Wear stump shrinker and the [employer] limb protector  SURGEON:  [name], MD  ANESTHESIA:   General  PREOPERATIVE INDICATIONS:  Carlos [name] is a  47 y.o. male with a diagnosis of Right Gangrene Foot who failed conservative measures and elected for surgical management.    The risks benefits and alternatives were discussed with the patient preoperatively including but not limited to the risks of infection, bleeding, nerve injury, cardiopulmonary complications, the need for revision surgery, among others, and the patient was willing to proceed.  OPERATIVE IMPLANTS:   Implant Name Type Inv. Item Serial No. Manufacturer Lot No. LRB No. Used Action  GRAFT SKIN WND MICRO 38 - [id number] Tissue GRAFT SKIN WND MICRO 38  [employer] [id number] Right 1 Implanted     OPERATIVE FINDINGS: Patient had abscess extending to the mid calf.  Tissue was sent for cultures.  OPERATIVE PROCEDURE: Patient was brought to the operating room after undergoing a regional anesthetic.  After adequate levels anesthesia were obtained a thigh tourniquet was placed and the lower extremity was prepped using DuraPrep draped into a sterile field. The foot was draped out of the sterile field with impervious stockinette.  A timeout was called and the tourniquet inflated.  A transverse skin incision was made 12 cm distal to the tibial tubercle, the incision curved proximally, and a large posterior flap was created.  The tibia was transected just proximal to the skin incision and beveled anteriorly.  The fibula was transected just proximal to  the tibial incision.  The sciatic nerve was pulled cut and allowed to retract.  The vascular bundles were suture ligated with 2-0 silk.  The tourniquet was deflated and hemostasis obtained.    Patient had abscess that extended to the mid calf region.  The necrotic muscle fascia and abscess were sent for cultures.  The wound was irrigated with normal saline.  The remaining tissue had good contractility and color.    The Kerecis micro powder 38 cm was applied to the open wound that has a 200 cm surface area.    The deep and superficial fascial layers were not closed.  A cleanse choice wound VAC sponge was then placed within the wound and the wound was closed over the cleanse choice sponges with 2-0 nylon.  This was then covered with derma tack Ioband and negative pressure this had a good suction fit.  A stump shrinker was applied and a limb protector.  Patient was extubated taken the PACU in stable condition.   Will plan for return to the operating room on Wednesday for repeat debridement and anticipate wound closure once the wound bed is healthy.    Continue the broad-spectrum antibiotic coverage.  If current treatment does not resolve this infection patient would require an above-the-knee amputation.

## 2022-10-10 NOTE — Anesthesia Preprocedure Evaluation (Addendum)
Anesthesia Evaluation  Patient identified by MRN, date of birth, ID band Patient awake    Reviewed: Allergy & Precautions, NPO status   Airway Mallampati: II  TM Distance: >3 FB Neck ROM: Full    Dental no notable dental hx.    Pulmonary neg pulmonary ROS   Pulmonary exam normal        Cardiovascular negative cardio ROS  Rhythm:Regular Rate:Normal     Neuro/Psych negative neurological ROS  negative psych ROS   GI/Hepatic negative GI ROS, Neg liver ROS,,,  Endo/Other  diabetes, Type 2    Renal/GU   negative genitourinary   Musculoskeletal Gangrenous foot   Abdominal Normal abdominal exam  (+)   Peds  Hematology negative hematology ROS (+)   Anesthesia Other Findings   Reproductive/Obstetrics                             Anesthesia Physical Anesthesia Plan  ASA: 3  Anesthesia Plan: General and Regional   Post-op Pain Management:    Induction: Intravenous  PONV Risk Score and Plan: 2 and Ondansetron, Midazolam and Treatment may vary due to age or medical condition  Airway Management Planned: Mask and LMA  Additional Equipment: None  Intra-op Plan:   Post-operative Plan: Extubation in OR  Informed Consent: I have reviewed the patients History and Physical, chart, labs and discussed the procedure including the risks, benefits and alternatives for the proposed anesthesia with the patient or authorized representative who has indicated his/her understanding and acceptance.     Dental advisory given  Plan Discussed with: CRNA  Anesthesia Plan Comments:        Anesthesia Quick Evaluation

## 2022-10-11 ENCOUNTER — Inpatient Hospital Stay (HOSPITAL_COMMUNITY): Payer: Medicaid Other

## 2022-10-11 ENCOUNTER — Encounter (HOSPITAL_COMMUNITY): Payer: Self-pay | Admitting: Orthopedic Surgery

## 2022-10-11 DIAGNOSIS — Z794 Long term (current) use of insulin: Secondary | ICD-10-CM

## 2022-10-11 DIAGNOSIS — Z8673 Personal history of transient ischemic attack (TIA), and cerebral infarction without residual deficits: Secondary | ICD-10-CM

## 2022-10-11 DIAGNOSIS — E1152 Type 2 diabetes mellitus with diabetic peripheral angiopathy with gangrene: Secondary | ICD-10-CM

## 2022-10-11 DIAGNOSIS — B955 Unspecified streptococcus as the cause of diseases classified elsewhere: Secondary | ICD-10-CM

## 2022-10-11 DIAGNOSIS — R7881 Bacteremia: Secondary | ICD-10-CM

## 2022-10-11 LAB — ECHOCARDIOGRAM COMPLETE
AR max vel: 3.04 cm2
AV Area VTI: 3.02 cm2
AV Area mean vel: 3.07 cm2
AV Mean grad: 1 mmHg
AV Peak grad: 2.6 mmHg
Ao pk vel: 0.81 m/s
Area-P 1/2: 3.85 cm2
Height: 69 in
S' Lateral: 3.9 cm
Weight: 3439.18 oz

## 2022-10-11 LAB — GLUCOSE, CAPILLARY
Glucose-Capillary: 210 mg/dL — ABNORMAL HIGH (ref 70–99)
Glucose-Capillary: 267 mg/dL — ABNORMAL HIGH (ref 70–99)
Glucose-Capillary: 287 mg/dL — ABNORMAL HIGH (ref 70–99)
Glucose-Capillary: 290 mg/dL — ABNORMAL HIGH (ref 70–99)
Glucose-Capillary: 308 mg/dL — ABNORMAL HIGH (ref 70–99)
Glucose-Capillary: 332 mg/dL — ABNORMAL HIGH (ref 70–99)

## 2022-10-11 LAB — CBC WITH DIFFERENTIAL/PLATELET
Abs Immature Granulocytes: 0 10*3/uL (ref 0.00–0.07)
Band Neutrophils: 1 %
Basophils Absolute: 0 10*3/uL (ref 0.0–0.1)
Basophils Relative: 0 %
Eosinophils Absolute: 0 10*3/uL (ref 0.0–0.5)
Eosinophils Relative: 0 %
HCT: 28.7 % — ABNORMAL LOW (ref 39.0–52.0)
Hemoglobin: 9.1 g/dL — ABNORMAL LOW (ref 13.0–17.0)
Lymphocytes Relative: 2 %
Lymphs Abs: 0.5 10*3/uL — ABNORMAL LOW (ref 0.7–4.0)
MCH: 29.4 pg (ref 26.0–34.0)
MCHC: 31.7 g/dL (ref 30.0–36.0)
MCV: 92.9 fL (ref 80.0–100.0)
Monocytes Absolute: 0.8 10*3/uL (ref 0.1–1.0)
Monocytes Relative: 3 %
Neutro Abs: 24 10*3/uL — ABNORMAL HIGH (ref 1.7–7.7)
Neutrophils Relative %: 94 %
Platelets: 201 10*3/uL (ref 150–400)
RBC: 3.09 MIL/uL — ABNORMAL LOW (ref 4.22–5.81)
RDW: 13.1 % (ref 11.5–15.5)
WBC: 25.3 10*3/uL — ABNORMAL HIGH (ref 4.0–10.5)
nRBC: 0 % (ref 0.0–0.2)
nRBC: 0 /100 WBC

## 2022-10-11 LAB — BASIC METABOLIC PANEL
Anion gap: 9 (ref 5–15)
BUN: 21 mg/dL — ABNORMAL HIGH (ref 6–20)
CO2: 22 mmol/L (ref 22–32)
Calcium: 7.3 mg/dL — ABNORMAL LOW (ref 8.9–10.3)
Chloride: 96 mmol/L — ABNORMAL LOW (ref 98–111)
Creatinine, Ser: 1.14 mg/dL (ref 0.61–1.24)
GFR, Estimated: >60 mL/min (ref >60)
Glucose, Bld: 343 mg/dL — ABNORMAL HIGH (ref 70–99)
Potassium: 4.5 mmol/L (ref 3.5–5.1)
Sodium: 127 mmol/L — ABNORMAL LOW (ref 135–145)

## 2022-10-11 LAB — AEROBIC/ANAEROBIC CULTURE W GRAM STAIN (SURGICAL/DEEP WOUND)

## 2022-10-11 LAB — CULTURE, BLOOD (ROUTINE X 2): Special Requests: ADEQUATE

## 2022-10-11 LAB — PATHOLOGIST SMEAR REVIEW

## 2022-10-11 MED ORDER — INSULIN STARTER KIT- PEN NEEDLES (ENGLISH)
1.0000 | Freq: Once | Status: DC
  Filled 2022-10-11: qty 1

## 2022-10-11 MED ORDER — INSULIN ASPART 100 UNIT/ML IJ SOLN
0.0000 [IU] | Freq: Every day | INTRAMUSCULAR | Status: DC
  Administered 2022-10-11: 2 [IU] via SUBCUTANEOUS

## 2022-10-11 MED ORDER — METRONIDAZOLE 500 MG PO TABS: 500.0000 mg | ORAL_TABLET | Freq: Two times a day (BID) | ORAL | Status: DC

## 2022-10-11 MED ORDER — METRONIDAZOLE 500 MG/100ML IV SOLN
500.0000 mg | Freq: Two times a day (BID) | INTRAVENOUS | Status: DC
  Administered 2022-10-11 – 2022-10-13 (×4): 500 mg via INTRAVENOUS
  Filled 2022-10-11 (×6): qty 100

## 2022-10-11 MED ORDER — INSULIN ASPART 100 UNIT/ML IJ SOLN
0.0000 [IU] | Freq: Three times a day (TID) | INTRAMUSCULAR | Status: DC
  Administered 2022-10-11 (×2): 8 [IU] via SUBCUTANEOUS
  Administered 2022-10-12: 5 [IU] via SUBCUTANEOUS
  Administered 2022-10-12 (×2): 3 [IU] via SUBCUTANEOUS

## 2022-10-11 MED ORDER — SODIUM CHLORIDE 0.9 % IV SOLN
2.0000 g | INTRAVENOUS | Status: DC
  Administered 2022-10-11 – 2022-10-13 (×2): 2 g via INTRAVENOUS
  Filled 2022-10-11 (×3): qty 20

## 2022-10-11 MED ORDER — LIVING WELL WITH DIABETES BOOK
Freq: Once | Status: DC
  Filled 2022-10-11: qty 1

## 2022-10-11 MED ORDER — INSULIN GLARGINE-YFGN 100 UNIT/ML ~~LOC~~ SOLN
30.0000 [IU] | Freq: Every day | SUBCUTANEOUS | Status: DC
  Administered 2022-10-11 – 2022-10-12 (×2): 30 [IU] via SUBCUTANEOUS
  Filled 2022-10-11 (×10): qty 0.3

## 2022-10-11 MED ORDER — PROSOURCE PLUS PO LIQD
30.0000 mL | Freq: Three times a day (TID) | ORAL | Status: DC
  Filled 2022-10-11 (×4): qty 30

## 2022-10-11 NOTE — Discharge Instructions (Addendum)
Gastroparesis Nutrition Therapy  Gastroparesis means that your stomach empties very slowly. This happens when the nerves to your stomach are damaged or do not work properly. This can cause bloating, stomach discomfort or pain, feeling full after eating only a small amount of food, nausea, or vomiting. If you have diabetes in addition to gastroparesis, it is important to control your blood glucose. This will help the stomach empty.  Tips Following these tips may help your stomach empty faster:  Eat small, frequent meals (4 to 6 times per day).  Do not eat solid foods that are high in fat and do not add too much fat to foods. See the Foods Not Recommended table for foods that are high fat.  High-fat solid foods may delay the emptying of your stomach.  Liquids that contain fat, such as milkshakes, may be tolerated and can provide needed calories. Do not eat foods high in fiber. Do not take fiber supplements or fiber bulking agents for constipation.  Do not eat foods that increase acid reflux:  Acidic, spicy, fried and greasy foods  Caffeine  Mint Do not drink alcohol or smoke  Do not drink carbonated beverages, as they increase bloating.  Chew foods well before swallowing. Solid foods in the stomach do not empty well. If you have difficulty tolerating solid foods, ground foods may be better.  If symptoms are severe, semi-solid foods or liquids may need to be your main food sources. Choose liquid nutritional supplements that have less than or equal to 2 grams fiber per serving.  Sit upright while eating and sit upright or walk after meals. Do not lie down for 3 to 4 hours after eating to avoid reflux or regurgitation.  If you wish to nap during the day, nap first and then eat.  Drinking fluids at meals can take up room in your stomach, and you might not get enough calories. At every meal, first eat a grain food and a protein food or dairy product if your body can tolerate it. Drink fluids with  calories. It may be better to delay fluids until after the meal and drink more between meals.   Foods Recommended Food Group Foods Recommended   Grains Choose grain foods with less than 2 grams of fiber per serving; these will be made with white flour Crackers: saltines or graham crackers Cold cereal: puffed rice Cream of rice or wheat Grits (fine ground) Gluten free low fiber foods Pretzels White bread, toasted White rice, cook until very soft  Protein Foods Lean meat and poultry: well-cooked, very tender, moist, and chopped fine Fish: tuna, salmon, or white fish Egg whites, scrambled Peanut butter (limit to 1 tablespoon at a time)  Dairy Milk*, drink 2% if tolerated to get more nutrients or lactose-free 2% milk Fortified non-dairy milks: almond, cashew, coconut, or rice (be aware that these options are not good sources of protein so you will need to eat an additional protein food) Fortified pea milk or soymilk (may cause gas and bloating for some) Instant breakfast* (pre-made lactose-free is sold in bottles) Milkshakes* (try blending in  to  cup canned fruit) Ice cream* (low-fat may be tolerated better; use in milkshakes to increase calories) Frozen yogurt Yogurt* Puddings and custard* Sherbet Liquid nutritional supplements with less than or equal to 2 grams fiber per 1 cup serving *Use lactose-free varieties to reduce gas and bloating  Vegetables Canned and well-cooked vegetables without seeds, skins or hulls Carrots, cooked Mashed potatoes (white, red or yellow) Sweet   potato  Fruit Canned, soft and well-cooked fruits without seeds, skins or membranes Applesauce Banana, mashed may be tolerated better Diced peaches/pears fruit cups in juice Melon, very soft, cut into small pieces Fruit nectar juices  Oils When possible choose oils rather than solid fats Canola or olive oil Margarine  Other Clear soup Gelatin Popsicles   Foods Not Recommended Food Group Foods Not  Recommended   Grains Bran Grains foods with 2 or more grams of fiber per serving: barley, brown rice, kasha, quinoa Popcorn Whole grain and high-fiber cereals, including oats or granola Whole grain bread or pasta  Protein Foods Fried meats, poultry or fish Sausage, bacon or hot dogs Seafood Tough meat, meat with gristle: steak, roast beef or pork chops Beans, peas or lentils Nuts  Dairy Cheese slices Liquid nutritional supplements that have more than 2 grams fiber per serving Pea milk, soymilk (may increase gas and bloating)  Vegetables Raw or undercooked vegetables Alfalfa, asparagus, bean sprouts, broccoli, brussels sprouts, cabbage, cauliflower, corn, green peas or any other kind of peas, lima beans, mushrooms, okra, onions, parsnips, peppers, pickles, potato skins, or spinach  Fruit Fresh fruit except for the ones in the foods recommended table Acidic fruit and juices: oranges/orange juice, grapefruit/grapefruit juice, tomatoes/tomato juice Avocado Berries Coconut Dried fruit Fruit skin Mandarin oranges Pineapple  Oils Fried foods of any type  Other Coffee Olives or pickles Pizza Salsa Sushi   Gastroparesis Sample 1-Day Menu  Breakfast 1 slice white toast (1 carbohydrate serving)  1 teaspoon margarine, soft, tub   cup egg substitute  1 cup peach nectar (2 carbohydrate servings)  Morning Snack Smoothie made with:  small banana (1 carbohydrate serving)  1/3 cup Greek strawberry yogurt ( carbohydrate serving)  1 cup 2% milk (1 carbohydrate serving)  Lunch 2 ounces canned chicken  1 teaspoon mayonnaise  9 saltine crackers (1 carbohydrate servings)   cup applesauce (1 carbohydrate serving)  Afternoon Snack 1 slice white toast (1 carbohydrate serving)  1 tablespoon smooth peanut butter  Evening Meal 2 ounces baked fish   cup mashed potatoes (1 carbohydrate serving)  1 teaspoon olive oil  1 cup 2% milk (1 carbohydrate serving)  Evening Snack 1 packet instant  breakfast (1 carbohydrate servings)  1 cup 2% milk (1 carbohydrate serving)   Gastroparesis Vegetarian (Lacto-Ovo) Sample 1-Day Menu  Breakfast  cup cooked farina (1 carbohydrate serving)   cup egg substitute  2 teaspoons olive oil   cup peach nectar (2 carbohydrate servings)   cup 2% milk ( carbohydrate serving)  Morning Snack 1 slice white toast (1 carbohydrate serving)  1 tablespoon smooth peanut butter  Lunch  cup vegetable soup (1 carbohydrate serving)  9 saltine crackers (1 carbohydrate serving)   cup applesauce (1 carbohydrate serving)   cup 2% milk ( carbohydrate serving)  Afternoon Snack 6 ounces plain yogurt (1 carbohydrate serving)   small banana (1 carbohydrate servings)  Evening Meal  cup baked tofu  2/3 cup white rice (2 carbohydrate servings)  2 teaspoons olive oil   cup 2% milk ( carbohydrate serving)  Evening Snack 1 packet instant breakfast (1 carbohydrate servings)  1 cup 2% milk (1 carbohydrate serving)   Gastroparesis Vegan Sample 1-Day Menu  Breakfast  cup cooked farina (1 carbohydrate serving)  1/3 cup tofu scramble  2 teaspoons olive oil   cup peach nectar (2 carbohydrate servings)   cup almond milk fortified with calcium, vitamin B12, and vitamin D  Morning Snack 1 slice white toast (1   carbohydrate serving)  1 tablespoon smooth peanut butter  Lunch  cup vegetable soup (1 carbohydrate serving)  9 saltine crackers (1 carbohydrate serving)   cup applesauce (1 carbohydrate serving)  Afternoon Snack 6 ounces plain soy yogurt (1 carbohydrate servings)   small banana (1 carbohydrate serving)  Evening Meal  cup baked tofu  2/3 cup white rice (2 carbohydrate servings)  2 teaspoons olive oil   cup almond milk fortified with calcium, vitamin B12, and vitamin D  Evening Snack  scoop soy protein powder ( carbohydrate serving)   cup almond milk fortified with calcium, vitamin B12, and vitamin D  Copyright 2020  [employer]  [employer]. All rights reserved.       Plate Method for Diabetes   Foods with carbohydrates make your blood glucose level go up. The plate method is a simple way to meal plan and control the amount of carbohydrate you eat.           Use the following guidance to build a healthy plate to control carbohydrates. Divide a 9-inch plate into 3 sections, and consider your beverage the 4th section of your meal: Food Group Examples of Foods/Beverages for This Section of your Meal  Section 1: Non-starchy vegetables Fill  of your plate to include non-starchy vegetables Asparagus, broccoli, brussels sprouts, cabbage, carrots, cauliflower, celery, cucumber, green beans, mushrooms, peppers, salad greens, tomatoes, or zucchini.  Section 2: Protein foods Fill  of your plate to include a lean protein Lean meat, poultry, fish, seafood, cheese, eggs, lean deli meat, tofu, beans, lentils, nuts or nut butters.  Section 3: Carbohydrate foods Fill  of your plate to include carbohydrate foods Whole grains, whole wheat bread, brown rice, whole grain pasta, polenta, corn tortillas, fruit, or starchy vegetables (potatoes, green peas, corn, beans, acorn squash, and butternut squash). One cup of milk also counts as a food that contains carbohydrate.  Section 4: Beverage Choose water or a low-calorie drink for your beverage. Unsweetened tea, coffee, or flavored/sparkling water without added sugar.  Image reprinted with permission from The American [employer].  Copyright 2022 by the American [employer] Association.   Copyright 2022  [employer]. All rights reserved       Carbohydrate Counting For People With Diabetes  Foods with carbohydrates make your blood glucose level go up. Learning how to count carbohydrates can help you control your blood glucose levels. First, identify the foods you eat that contain carbohydrates. Then, using the Foods with Carbohydrates chart, determine  about how much carbohydrates are in your meals and snacks. Make sure you are eating foods with fiber, protein, and healthy fat along with your carbohydrate foods.  Foods with Carbohydrates The following table shows carbohydrate foods that have about 15 grams of carbohydrate each. Using measuring cups, spoons, or a food scale when you first begin learning about carbohydrate counting can help you learn about the portion sizes you typically eat.  The following foods have 15 grams carbohydrate each:  Grains 1 slice bread (1 ounce)  1 small tortilla (6-inch size)   large bagel (1 ounce)  1/3 cup pasta or rice (cooked)   hamburger or hot dog bun ( ounce)   cup cooked cereal   to  cup ready-to-eat cereal  2 taco shells (5-inch size) Fruit 1 small fresh fruit ( to 1 cup)   medium banana  17 small grapes (3 ounces)  1 cup melon or berries   cup canned or frozen fruit  2 tablespoons dried fruit (blueberries, cherries, cranberries, raisins)   cup unsweetened fruit juice  Starchy Vegetables  cup cooked beans, peas, corn, potatoes/sweet potatoes   large baked potato (3 ounces)  1 cup acorn or butternut squash  Snack Foods 3 to 6 crackers  8 potato chips or 13 tortilla chips ( ounce to 1 ounce)  3 cups popped popcorn  Dairy 3/4 cup (6 ounces) nonfat plain yogurt, or yogurt with sugar-free sweetener  1 cup milk  1 cup plain rice, soy, coconut or flavored almond milk Sweets and Desserts  cup ice cream or frozen yogurt  1 tablespoon jam, jelly, pancake syrup, table sugar, or honey  2 tablespoons light pancake syrup  1 inch square of frosted cake or 2 inch square of unfrosted cake  2 small cookies (2/3 ounce each) or  large cookie   Sometimes you'll have to estimate carbohydrate amounts if you don't know the exact recipe. One cup of mixed foods like soups can have 1 to 2 carbohydrate servings, while some casseroles might have 2 or more servings of carbohydrate. Foods that have  less than 20 calories in each serving can be counted as "free" foods. Count 1 cup raw vegetables, or  cup cooked non-starchy vegetables as "free" foods. If you eat 3 or more servings at one meal, then count them as 1 carbohydrate serving.   Foods without Carbohydrates  Not all foods contain carbohydrates. Meat, some dairy, fats, non-starchy vegetables, and many beverages don't contain carbohydrate. So when you count carbohydrates, you can generally exclude chicken, pork, beef, fish, seafood, eggs, tofu, cheese, butter, sour cream, avocado, nuts, seeds, olives, mayonnaise, water, black coffee, unsweetened tea, and zero-calorie drinks. Vegetables with no or low carbohydrate include green beans, cauliflower, tomatoes, and onions.  How much carbohydrate should I eat at each meal?  Carbohydrate counting can help you plan your meals and manage your weight. Following are some starting points for carbohydrate intake at each meal. Work with your registered dietitian nutritionist to find the best range that works for your blood glucose and weight.   To Lose Weight To Maintain Weight  Women 2 - 3 carb servings 3 - 4 carb servings  Men 3 - 4 carb servings 4 - 5 carb servings   Checking your blood glucose after meals will help you know if you need to adjust the timing, type, or number of carbohydrate servings in your meal plan. Achieve and keep a healthy body weight by balancing your food intake and physical activity.  Tips How should I plan my meals?  Plan for half the food on your plate to include non-starchy vegetables, like salad greens, broccoli, or carrots. Try to eat 3 to 5 servings of non-starchy vegetables every day. Have a protein food at each meal. Protein foods include chicken, fish, meat, eggs, or beans (note that beans contain carbohydrate). These two food groups (non-starchy vegetables and proteins) are low in carbohydrate. If you fill up your plate with these foods, you will eat less carbohydrate  but still fill up your stomach. Try to limit your carbohydrate portion to  of the plate.   What fats are healthiest to eat?  Diabetes increases risk for heart disease. To help protect your heart, eat more healthy fats, such as olive oil, nuts, and avocado. Eat less saturated fats like butter, cream, and high-fat meats, like bacon and sausage. Avoid trans fats, which are in all foods that list "partially hydrogenated oil" as an ingredient.  What should  I drink?  Choose drinks that are not sweetened with sugar. The healthiest choices are water, carbonated or seltzer waters, and tea and coffee without added sugars.  Sweet drinks will make your blood glucose go up very quickly. One serving of soda or energy drink is  cup. It is best to drink these beverages only if your blood glucose is low.  Artificially sweetened, or diet drinks, typically do not increase your blood glucose if they have zero calories in them. Read labels of beverages, as some diet drinks do have carbohydrate and will raise your blood glucose.  Label Reading Tips Read Nutrition Facts labels to find out how many grams of carbohydrate are in a food you want to eat. Don't forget: sometimes serving sizes on the label aren't the same as how much food you are going to eat, so you may need to calculate how much carbohydrate is in the food you are serving yourself.   Carbohydrate Counting for People with Diabetes Sample 1-Day Menu  Breakfast  cup yogurt, low fat, low sugar (1 carbohydrate serving)   cup cereal, ready-to-eat, unsweetened (1 carbohydrate serving)  1 cup strawberries (1 carbohydrate serving)   cup almonds ( carbohydrate serving)  Lunch 1, 5 ounce can chunk light tuna  2 ounces cheese, low fat cheddar  6 whole wheat crackers (1 carbohydrate serving)  1 small apple (1 carbohydrate servings)   cup carrots ( carbohydrate serving)   cup snap peas  1 cup 1% milk (1 carbohydrate serving)   Evening Meal Stir fry made  with: 3 ounces chicken  1 cup brown rice (3 carbohydrate servings)   cup broccoli ( carbohydrate serving)   cup green beans   cup onions  1 tablespoon olive oil  2 tablespoons teriyaki sauce ( carbohydrate serving)  Evening Snack 1 extra small banana (1 carbohydrate serving)  1 tablespoon peanut butter   Carbohydrate Counting for People with Diabetes Vegan Sample 1-Day Menu  Breakfast 1 cup cooked oatmeal (2 carbohydrate servings)   cup blueberries (1 carbohydrate serving)  2 tablespoons flaxseeds  1 cup soymilk fortified with calcium and vitamin D  1 cup coffee  Lunch 2 slices whole wheat bread (2 carbohydrate servings)   cup baked tofu   cup lettuce  2 slices tomato  2 slices avocado   cup baby carrots ( carbohydrate serving)  1 orange (1 carbohydrate serving)  1 cup soymilk fortified with calcium and vitamin D   Evening Meal Burrito made with: 1 6-inch corn tortilla (1 carbohydrate serving)  1 cup refried vegetarian beans (2 carbohydrate servings)   cup chopped tomatoes   cup lettuce   cup salsa  1/3 cup brown rice (1 carbohydrate serving)  1 tablespoon olive oil for rice   cup zucchini   Evening Snack 6 small whole grain crackers (1 carbohydrate serving)  2 apricots ( carbohydrate serving)   cup unsalted peanuts ( carbohydrate serving)    Carbohydrate Counting for People with Diabetes Vegetarian (Lacto-Ovo) Sample 1-Day Menu  Breakfast 1 cup cooked oatmeal (2 carbohydrate servings)   cup blueberries (1 carbohydrate serving)  2 tablespoons flaxseeds  1 egg  1 cup 1% milk (1 carbohydrate serving)  1 cup coffee  Lunch 2 slices whole wheat bread (2 carbohydrate servings)  2 ounces low-fat cheese   cup lettuce  2 slices tomato  2 slices avocado   cup baby carrots ( carbohydrate serving)  1 orange (1 carbohydrate serving)  1 cup unsweetened tea  Evening Meal Burrito made  with: 1 6-inch corn tortilla (1 carbohydrate serving)   cup refried  vegetarian beans (1 carbohydrate serving)   cup tomatoes   cup lettuce   cup salsa  1/3 cup brown rice (1 carbohydrate serving)  1 tablespoon olive oil for rice   cup zucchini  1 cup 1% milk (1 carbohydrate serving)  Evening Snack 6 small whole grain crackers (1 carbohydrate serving)  2 apricots ( carbohydrate serving)   cup unsalted peanuts ( carbohydrate serving)    Copyright 2020  [employer]y [employer]. All rights reserved.  Using Nutrition Labels: Carbohydrate  Serving Size  Look at the serving size. All the information on the label is based on this portion. Servings Per Container  The number of servings contained in the package. Guidelines for Carbohydrate  Look at the total grams of carbohydrate in the serving size.  1 carbohydrate choice = 15 grams of carbohydrate. Range of Carbohydrate Grams Per Choice  Carbohydrate Grams/Choice Carbohydrate Choices  6-10   11-20 1  21-25 1  26-35 2  36-40 2  41-50 3  51-55 3  56-65 4  66-70 4  71-80 5    Copyright 2020  [employer]. All rights reserved.

## 2022-10-11 NOTE — Evaluation (Signed)
Physical Therapy Evaluation Patient Details Name: Carlos [name] MRN: [id number] DOB: 18-Jan-1976 Today's Date: 10/11/2022  History of Present Illness  47 y.o. male presented to [facility] ED 10/08/22 with complaint of progressive swelling and blackening of right big toe for 3 weeks. 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 with PMH significant for untreated DM2, gastroparesis, CVA, MRSA cellulitis 2021.  Clinical Impression  Patient received in recliner. He is agreeable to PT assessment. Patient presenting with flat affect and lethargy during session. He is able to scoot to edge of chair with verbal cues and performed sit to stand from low recliner with min/mod A. Patient is able to pivot in standing with min A and RW. He is unable to attempt hopping this session as he reports feeling light headed and requested to sit back down. Patient will continue to benefit from skilled PT to improve strength, safety and functional independence         Recommendations for follow up therapy are one component of a multi-disciplinary discharge planning process, led by the attending physician.  Recommendations may be updated based on patient status, additional functional criteria and insurance authorization.  Follow Up Recommendations       Assistance Recommended at Discharge Frequent or constant Supervision/Assistance  Patient can return home with the following  A lot of help with walking and/or transfers;A little help with bathing/dressing/bathroom    Equipment Recommendations Rolling walker (2 wheels);BSC/3in1;Wheelchair (measurements PT);Wheelchair cushion (measurements PT)  Recommendations for Other Services       Functional Status Assessment Patient has had a recent decline in their functional status and demonstrates the ability to make significant improvements in function in a reasonable and predictable amount of time.     Precautions / Restrictions Precautions Precautions: Fall Precaution Comments: R BKA, Wound  vac Restrictions Weight Bearing Restrictions: Yes RLE Weight Bearing: Non weight bearing Other Position/Activity Restrictions: Limb protector      Mobility  Bed Mobility               General bed mobility comments: NT patient up in recliner on arrival    Transfers Overall transfer level: Needs assistance Equipment used: Rolling walker (2 wheels) Transfers: Sit to/from Stand Sit to Stand: Min assist Stand pivot transfers: Min assist         General transfer comment: Cues for scooting to edge of chair prior to standing, hand placement.    Ambulation/Gait               General Gait Details: unable, patient feeling lightheaded and needed to return to sitting.  Stairs            Wheelchair Mobility    Modified Rankin (Stroke Patients Only)       Balance Overall balance assessment: Needs assistance Sitting-balance support: Feet supported Sitting balance-Leahy Scale: Normal     Standing balance support: Bilateral upper extremity supported, During functional activity, Reliant on assistive device for balance Standing balance-Leahy Scale: Poor                               Pertinent Vitals/Pain Pain Assessment Pain Assessment: Faces Faces Pain Scale: Hurts a little bit Pain Location: R LE Pain Descriptors / Indicators: Discomfort, Sore Pain Intervention(s): Monitored during session, Premedicated before session    Home Living Family/patient expects to be discharged to:: Private residence Living Arrangements: Children Available Help at Discharge: Family;Available PRN/intermittently Type of Home: Apartment Home Access: Level entry  Home Layout: One level Home Equipment: None      Prior Function Prior Level of Function : Independent/Modified Independent;Working/employed             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 Comments: independent     Hand Dominance    Dominant Hand: Right    Extremity/Trunk Assessment   Upper Extremity Assessment Upper Extremity Assessment: Defer to OT evaluation    Lower Extremity Assessment Lower Extremity Assessment: RLE deficits/detail RLE Deficits / Details: s/p transtibial amp    Cervical / Trunk Assessment Cervical / Trunk Assessment: Other exceptions Cervical / Trunk Exceptions: body  habitus  Communication   Communication: No difficulties  Cognition Arousal/Alertness: Awake/alert Behavior During Therapy: Flat affect Overall Cognitive Status: Within Functional Limits for tasks assessed                                          General Comments General comments (skin integrity, edema, etc.): Guaze wrap around right forearm covering several blisters    Exercises     Assessment/Plan    PT Assessment Patient needs continued PT services  PT Problem List Decreased strength;Decreased activity tolerance;Decreased balance;Decreased mobility;Decreased skin integrity;Decreased knowledge of use of DME;Pain;Obesity       PT Treatment Interventions DME instruction;Therapeutic exercise;Gait training;Functional mobility training;Therapeutic activities;Patient/family education;Balance training;Wheelchair mobility training    PT Goals (Current goals can be found in the Care Plan section)  Acute Rehab PT Goals Patient Stated Goal: to improve PT Goal Formulation: With patient Time For Goal Achievement: 10/25/22 Potential to Achieve Goals: Good    Frequency Min 5X/week     Co-evaluation               AM-PAC PT "6 Clicks" Mobility  Outcome Measure Help needed turning from your back to your side while in a flat bed without using bedrails?: A Little Help needed moving from lying on your back to sitting on the side of a flat bed without using bedrails?: A Little Help needed moving to and from a bed to a chair (including a wheelchair)?: A Little Help needed standing up from a chair  using your arms (e.g., wheelchair or bedside chair)?: A Little Help needed to walk in hospital room?: Total Help needed climbing 3-5 steps with a railing? : Total 6 Click Score: 14    End of Session Equipment Utilized During Treatment: Gait belt Activity Tolerance: Patient limited by fatigue Patient left: in chair;with call bell/phone within reach;with chair alarm set Nurse Communication: Mobility status PT Visit Diagnosis: Unsteadiness on feet (R26.81);Other abnormalities of gait and mobility (R26.89);Difficulty in walking, not elsewhere classified (R26.2);Pain Pain - Right/Left: Right Pain - part of body: Leg    Time: 4098-1191 PT Time Calculation (min) (ACUTE ONLY): 10 min   Charges:   PT Evaluation $PT Eval Moderate Complexity: 1 Mod          Lashannon Bresnan, PT, GCS 10/11/22,10:20 AM

## 2022-10-11 NOTE — Evaluation (Signed)
Occupational Therapy Evaluation Patient Details Name: Carlos [name] MRN: [id number] DOB: Apr 22, 1976 Today's Date: 10/11/2022   History of Present Illness 47 y.o. male with PMH significant for untreated DM2, gastroparesis, CVA, MRSA cellulitis 2021  5/3, patient presented to the ED with complaint of progressive swelling and blackening of right big toe for 3 weeks. s/p R BKA   Clinical Impression   Pt admitted with the above diagnosis. Pt currently with functional limitations due to the deficits listed below (see OT Problem List). Prior to admit, pt was living at home working, independent with all ADL tasks and functional mobility. Pt will benefit from acute skilled OT to increase their safety and independence with ADL and functional mobility for ADL to facilitate discharge. Patient will benefit from intensive inpatient follow up therapy, >3 hours/day. OT will continue to follow patient acutely.         Recommendations for follow up therapy are one component of a multi-disciplinary discharge planning process, led by the attending physician.  Recommendations may be updated based on patient status, additional functional criteria and insurance authorization.   Assistance Recommended at Discharge Frequent or constant Supervision/Assistance  Patient can return home with the following A lot of help with walking and/or transfers;A lot of help with bathing/dressing/bathroom;Assistance with cooking/housework;Help with stairs or ramp for entrance;Assist for transportation    Functional Status Assessment  Patient has had a recent decline in their functional status and demonstrates the ability to make significant improvements in function in a reasonable and predictable amount of time.  Equipment Recommendations  Other (comment) (defer to next venue of care)    Recommendations for Other Services Rehab consult     Precautions / Restrictions Precautions Precautions: Fall Precaution Comments: R BKA, Wound  vac Restrictions Weight Bearing Restrictions: Yes RLE Weight Bearing: Non weight bearing Other Position/Activity Restrictions: Limb protector      Mobility Bed Mobility Overal bed mobility: Needs Assistance Bed Mobility: Supine to Sit     Supine to sit: Supervision, HOB elevated       Patient Response: Cooperative, Flat affect  Transfers Overall transfer level: Needs assistance Equipment used: Rolling walker (2 wheels) Transfers: Sit to/from Stand, Bed to chair/wheelchair/BSC Sit to Stand: Min assist, From elevated surface Stand pivot transfers: Mod assist (towards left side)         General transfer comment: VC for hand placement and RW management during sit to stand. Provided verbal instruction and visual demonstration of heel toe pivot technique using RW.      Balance Overall balance assessment: Needs assistance Sitting-balance support: Feet supported, No upper extremity supported (LLE support) Sitting balance-Leahy Scale: Good Sitting balance - Comments: sitting EOB   Standing balance support: Reliant on assistive device for balance, During functional activity, Bilateral upper extremity supported Standing balance-Leahy Scale: Poor     ADL either performed or assessed with clinical judgement   ADL Overall ADL's : Needs assistance/impaired Eating/Feeding: Independent;Sitting   Grooming: Wash/dry hands;Wash/dry face;Oral care;Applying deodorant;Set up;Sitting Grooming Details (indicate cue type and reason): EOB Upper Body Bathing: Set up;Sitting Upper Body Bathing Details (indicate cue type and reason): EOB Lower Body Bathing: Bed level;Maximal assistance   Upper Body Dressing : Set up;Sitting Upper Body Dressing Details (indicate cue type and reason): EOB Lower Body Dressing: Total assistance;Bed level   Toilet Transfer: Moderate assistance;BSC/3in1;Stand-pivot;Rolling walker (2 wheels) Toilet Transfer Details (indicate cue type and reason): simulated to  recliner Toileting- Clothing Manipulation and Hygiene: Total assistance;Sitting/lateral lean  Vision Baseline Vision/History: 1 Wears glasses (or contacts for all the time) Ability to See in Adequate Light: 0 Adequate Patient Visual Report: No change from baseline Vision Assessment?: No apparent visual deficits            Pertinent Vitals/Pain Pain Assessment Pain Assessment: Faces Faces Pain Scale: Hurts even more Pain Location: left side lats/ribs when sitting on EOB Pain Descriptors / Indicators: Discomfort, Grimacing Pain Intervention(s): Repositioned, Monitored during session     Hand Dominance Right   Extremity/Trunk Assessment Upper Extremity Assessment Upper Extremity Assessment: Overall WFL for tasks assessed   Lower Extremity Assessment Lower Extremity Assessment: Defer to PT evaluation   Cervical / Trunk Assessment Cervical / Trunk Assessment: Other exceptions Cervical / Trunk Exceptions: body  habitus   Communication Communication Communication: No difficulties   Cognition Arousal/Alertness: Awake/alert Behavior During Therapy: Flat affect Overall Cognitive Status: Within Functional Limits for tasks assessed                  General Comments  Guaze wrap around right forearm covering several blisters            Home Living Family/patient expects to be discharged to:: Private residence Living Arrangements: Children Available Help at Discharge: Family;Available PRN/intermittently (Daughter just moved back in January. She is planning to move out this Summer. She goes to college and works. 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        OT Problem List: Decreased strength;Decreased coordination;Pain;Decreased activity tolerance;Decreased safety awareness;Impaired balance (sitting and/or standing);Decreased knowledge of use of DME or AE;Decreased knowledge of precautions      OT Treatment/Interventions: Self-care/ADL training;Therapeutic exercise;Therapeutic activities;Neuromuscular education;Energy conservation;Patient/family education;DME and/or AE instruction;Manual therapy;Balance training;Modalities    OT Goals(Current goals can be found in the care plan section) Acute Rehab OT Goals Patient Stated Goal: to get out of bed OT Goal Formulation: Patient unable to participate in goal setting Time For Goal Achievement: 10/25/22 Potential to Achieve Goals: Good  OT Frequency: Min 2X/week       AM-PAC OT "6 Clicks" Daily Activity     Outcome Measure Help from another person eating meals?: None Help from another person taking care of personal grooming?: A Little Help from another person toileting, which includes using toliet, bedpan, or urinal?: Total Help from another person bathing (including washing, rinsing, drying)?: Total Help from another person to put on and taking off regular upper body clothing?: A Little Help from another person to put on and taking off regular lower body clothing?: Total 6 Click Score: 13   End of Session Equipment Utilized During Treatment: Gait belt;Rolling walker (2 wheels)  Activity Tolerance: Patient tolerated treatment well Patient left: in chair;with call bell/phone within reach;with chair alarm set  OT Visit Diagnosis: Unsteadiness on feet (R26.81);Muscle weakness (generalized) (M62.81)                Time: 2130-8657 OT Time Calculation (min): 41 min Charges:  OT General Charges $OT Visit: 1 Visit OT  Evaluation $OT Eval  High Complexity: 1 High OT Treatments $Self Care/Home Management : 8-22 mins  Limmie Patricia, OTR/L,CBIS  Supplemental OT - [facility] and [facility] Secure Chat Preferred    Damek Ende, Charisse March 10/11/2022, 9:58 AM

## 2022-10-11 NOTE — Plan of Care (Signed)
  Problem: Pain Managment: Goal: General experience of comfort will improve Outcome: Progressing   Problem: Education: Goal: Ability to describe self-care measures that may prevent or decrease complications (Diabetes Survival Skills Education) will improve Outcome: Not Progressing   Problem: Activity: Goal: Risk for activity intolerance will decrease Outcome: Not Progressing   Problem: Nutrition: Goal: Adequate nutrition will be maintained Outcome: Not Progressing   Problem: Coping: Goal: Level of anxiety will decrease Outcome: Not Progressing

## 2022-10-11 NOTE — Inpatient Diabetes Management (Signed)
Inpatient Diabetes Program Recommendations  AACE/ADA: New Consensus Statement on Inpatient Glycemic Control (2015)  Target Ranges:  Prepandial:   less than 140 mg/dL      Peak postprandial:   less than 180 mg/dL (1-2 hours)      Critically ill patients:  140 - 180 mg/dL   Lab Results  Component Value Date   GLUCAP 267 (H) 10/11/2022   HGBA1C 10.4 (H) 10/08/2022    Review of Glycemic Control  Diabetes history: DM2 Outpatient Diabetes medications: none Current orders for Inpatient glycemic control: Semglee 30 QHS, Novolog 0-15 TID with meals and 0-5 HS  HgbA1C - 10.4%  Inpatient Diabetes Program Recommendations:    Consider adding Novolog 4 units TID with meals if eating > 50%. Titrate until post-prandials < 180 mg/dL  Spoke with pt and daughter at bedside. Pt states he's had diabetes for over 20 years and was on metformin at one time. Has not been followed by PCP for management of his diabetes. Has hx gastroparesis and has lost significant amount of weight. States he doesn't believe his foot amputation is related to his diabetes. Discussed A1C results (10.4%) and explained what an A1C is and informed patient that his current A1C indicates an average glucose of 250 mg/dl over the past 2-3 months. Discussed basic pathophysiology of DM Type 2, basic home care, importance of checking CBGs and maintaining good CBG control to prevent long-term and short-term complications. Reviewed glucose and A1C goalsExplained how uncontrolled blood sugars can lead to infection and healing is difficult with hyperglycemia. Also likely cause of his gastroparesis. Family hx DM. Daughter very concerned that he will not be compliant with meds and obtaining PCP. Very adament about not giving insulin in abdomen. Discussed other options with arms, leg. States he didn't want to discuss diabetes anymore. Will continue with education on 5/7. Discussed above with RN.  Thank you. Ailene Ards, RD, LDN, CDCES Inpatient  Diabetes Coordinator [phone]

## 2022-10-11 NOTE — Progress Notes (Signed)
PHARMACY - PHYSICIAN COMMUNICATION CRITICAL VALUE ALERT - BLOOD CULTURE IDENTIFICATION (BCID)    Assessment:  Carlos [name] is an 47 y.o. male who presented to [facility] on 10/08/2022 with a chief complaint of foot/calf pain s/p BKA on 5/5 and found to have abscesses of the calf. Patient will require a repeat debridement 5/8 and possibly 5/10.   5/13 1715 Bcx set: 2/2 strep constellatus  5/3 1719 Bcx set: 2/2 strep mitis/orals   Name of physician (or Provider) Contacted: Dr. Daiva Eves via [facility]   Current antibiotics: Ceftriaxone and flagyl   Changes to prescribed antibiotics recommended:  Patient is on recommended antibiotics - No changes needed  Results for orders placed or performed during the hospital encounter of 10/08/22  Blood Culture ID Panel (Reflexed) (Collected: 10/08/2022  5:19 PM)  Result Value Ref Range   Enterococcus faecalis NOT DETECTED NOT DETECTED   Enterococcus Faecium NOT DETECTED NOT DETECTED   Listeria monocytogenes NOT DETECTED NOT DETECTED   Staphylococcus species NOT DETECTED NOT DETECTED   Staphylococcus aureus (BCID) NOT DETECTED NOT DETECTED   Staphylococcus epidermidis NOT DETECTED NOT DETECTED   Staphylococcus lugdunensis NOT DETECTED NOT DETECTED   Streptococcus species DETECTED (A) NOT DETECTED   Streptococcus agalactiae NOT DETECTED NOT DETECTED   Streptococcus pneumoniae NOT DETECTED NOT DETECTED   Streptococcus pyogenes NOT DETECTED NOT DETECTED   A.calcoaceticus-baumannii NOT DETECTED NOT DETECTED   Bacteroides fragilis NOT DETECTED NOT DETECTED   Enterobacterales NOT DETECTED NOT DETECTED   Enterobacter cloacae complex NOT DETECTED NOT DETECTED   Escherichia coli NOT DETECTED NOT DETECTED   Klebsiella aerogenes NOT DETECTED NOT DETECTED   Klebsiella oxytoca NOT DETECTED NOT DETECTED   Klebsiella pneumoniae NOT DETECTED NOT DETECTED   Proteus species NOT DETECTED NOT DETECTED   Salmonella species NOT DETECTED NOT DETECTED   Serratia marcescens NOT  DETECTED NOT DETECTED   Haemophilus influenzae NOT DETECTED NOT DETECTED   Neisseria meningitidis NOT DETECTED NOT DETECTED   Pseudomonas aeruginosa NOT DETECTED NOT DETECTED   Stenotrophomonas maltophilia NOT DETECTED NOT DETECTED   Candida albicans NOT DETECTED NOT DETECTED   Candida auris NOT DETECTED NOT DETECTED   Candida glabrata NOT DETECTED NOT DETECTED   Candida krusei NOT DETECTED NOT DETECTED   Candida parapsilosis NOT DETECTED NOT DETECTED   Candida tropicalis NOT DETECTED NOT DETECTED   Cryptococcus neoformans/gattii NOT DETECTED NOT DETECTED    Jani Gravel, PharmD PGY-2 Infectious Diseases Resident  10/11/2022 3:38 PM

## 2022-10-11 NOTE — Consult Note (Signed)
Date of Admission:  10/08/2022          Reason for Consult: Streptococcus oralis/mitis bacteremia from gangrenous foot sp BKA     Referring Provider: Lorin Glass, MD   Assessment:  Streptococcus oralis/mitis bacteremia from  Gangrenous right foot with necrotizing fascitis sp BKA but who will need further surgery with Dr. Lajoyce Corners Poorly controlled diabetes mellitus History of stroke CVA  Plan:  Narrow to traction and Flagyl Blood cultures TTE and will need TEE Followup cultures from the operating room I think we can likely consolidate him to unasyn and ultimately IV PCN  Principal Problem:   Diabetic foot ulcer (HCC) Active Problems:   Diabetes (HCC)   Severe sepsis (HCC)   Hyponatremia   Lactic acidosis   AKI (acute kidney injury) (HCC)   Gastroparesis   Elevated troponin   H/O: CVA (cerebrovascular accident)   Gangrene of right foot (HCC)   Cellulitis of right lower extremity   Necrotizing fasciitis of lower leg (HCC)   Scheduled Meds:  (feeding supplement) PROSource Plus  30 mL Oral TID BM   vitamin C  1,000 mg Oral Daily   docusate sodium  100 mg Oral Daily   heparin  5,000 Units Subcutaneous Q8H   insulin aspart  0-15 Units Subcutaneous TID WC   insulin aspart  0-5 Units Subcutaneous QHS   insulin glargine-yfgn  30 Units Subcutaneous QHS   metroNIDAZOLE  500 mg Oral Q12H   nutrition supplement (JUVEN)  1 packet Oral BID BM   pantoprazole  40 mg Oral Daily   zinc sulfate  220 mg Oral Daily   Continuous Infusions:  sodium chloride Stopped (10/10/22 0906)   sodium chloride 75 mL/hr at 10/11/22 0630   cefTRIAXone (ROCEPHIN)  IV     magnesium sulfate bolus IVPB     PRN Meds:.acetaminophen, alum & mag hydroxide-simeth, bisacodyl, guaiFENesin-dextromethorphan, hydrALAZINE, HYDROmorphone (DILAUDID) injection, labetalol, magnesium citrate, magnesium sulfate bolus IVPB, metoprolol tartrate, morphine injection, ondansetron, oxyCODONE, oxyCODONE, phenol,  polyethylene glycol, potassium chloride, senna-docusate  HPI: Carlos [name] is a 47 y.o. male fully controlled diabetes mellitus with gastroparesis prior stroke prior MRSA cellulitis presented to ER with progressive swelling and blackening of his big toe with ischemic changes.  Blood cultures were taken.  The scan showed extensive subcutaneous and intramuscular gas within the musculature of the right foot in its entirety with necrotizing fasciitis as well as gas within the first metatarsal head as well as the proximal distal phalanx of the great toe concerning for osteomyelitis.  The patient was taken the operating room by Dr. Lajoyce Corners May 5 and underwent below the knee amputation with debridement of tissue and tissue and abscess sent for culture.  My understanding is there is still need for further debridement of tissue and examination in the operating room  In the interim blood cultures have yielded Streptococcus mitis/oralis.  Operative cultures have gram-positive cocci seen.  Patient was on vancomycin metronidazole and cefepime which have now narrowed to metronidazole and ceftriaxone.  His streptococcal species is sensitive to penicillins so we can certainly likely to switch over to Unasyn soon and potentially just penicillin though would like to read anaerobic coverage on given his necrotizing fasciitis presentation.  He does needfor workup for endocarditis with 2D echocardiogram followed by transesophageal echocardiogram  I have personally spent 84 minutes involved in face-to-face and non-face-to-face activities for this patient on the day of the visit. Professional time spent includes the following activities: Preparing to  see the patient (review of tests), Obtaining and/or reviewing separately obtained history (admission/discharge record), Performing a medically appropriate examination and/or evaluation , Ordering medications/tests/procedures, referring and communicating with other health care  professionals, Documenting clinical information in the EMR, Independently interpreting results (not separately reported), Communicating results to the patient/family/caregiver, Counseling and educating the patient/family/caregiver and Care coordination (not separately reported).    Review of Systems: Review of Systems  Constitutional:  Positive for fever. Negative for chills, diaphoresis, malaise/fatigue and weight loss.  HENT:  Negative for congestion, hearing loss, sore throat and tinnitus.   Eyes:  Negative for blurred vision and double vision.  Respiratory:  Negative for cough, sputum production, shortness of breath and wheezing.   Cardiovascular:  Negative for chest pain, palpitations and leg swelling.  Gastrointestinal:  Negative for abdominal pain, blood in stool, constipation, diarrhea, heartburn, melena, nausea and vomiting.  Genitourinary:  Negative for dysuria, flank pain and hematuria.  Musculoskeletal:  Positive for myalgias. Negative for back pain, falls and joint pain.  Skin:  Negative for itching and rash.  Neurological:  Positive for weakness. Negative for dizziness, sensory change, focal weakness, loss of consciousness and headaches.  Endo/Heme/Allergies:  Does not bruise/bleed easily.  Psychiatric/Behavioral:  Negative for depression, memory loss and suicidal ideas. The patient is not nervous/anxious.     Past Medical History:  Diagnosis Date   Diabetes mellitus     Social History   Tobacco Use   Smoking status: Never  Vaping Use   Vaping Use: Never used  Substance Use Topics   Alcohol use: Yes    Comment: rarely   Drug use: No    History reviewed. No pertinent family history. No Known Allergies  OBJECTIVE: Blood pressure 119/74, pulse 89, temperature 97.8 F (36.6 C), temperature source Oral, resp. rate 18, height 5\' 9"  (1.753 m), weight 97.5 kg, SpO2 93 %.  Physical Exam Constitutional:      General: He is not in acute distress.    Appearance: Normal  appearance. He is well-developed. He is not ill-appearing or diaphoretic.  HENT:     Head: Normocephalic and atraumatic.     Right Ear: Hearing and external ear normal.     Left Ear: Hearing and external ear normal.     Nose: No nasal deformity or rhinorrhea.  Eyes:     General: No scleral icterus.    Conjunctiva/sclera: Conjunctivae normal.     Right eye: Right conjunctiva is not injected.     Left eye: Left conjunctiva is not injected.     Pupils: Pupils are equal, round, and reactive to light.  Neck:     Vascular: No JVD.  Cardiovascular:     Rate and Rhythm: Normal rate and regular rhythm.     Heart sounds: Normal heart sounds, S1 normal and S2 normal.     No friction rub.  Pulmonary:     Effort: Pulmonary effort is normal. No respiratory distress.     Breath sounds: No wheezing or rales.  Abdominal:     General: There is no distension.     Palpations: Abdomen is soft.  Musculoskeletal:     Right shoulder: Normal.     Left shoulder: Normal.     Cervical back: Normal range of motion and neck supple.     Right hip: Normal.     Left hip: Normal.     Right knee: Normal.     Left knee: Normal.  Lymphadenopathy:     Head:  Right side of head: No submandibular, preauricular or posterior auricular adenopathy.     Left side of head: No submandibular, preauricular or posterior auricular adenopathy.     Cervical: No cervical adenopathy.     Right cervical: No superficial or deep cervical adenopathy.    Left cervical: No superficial or deep cervical adenopathy.  Skin:    General: Skin is warm and dry.     Coloration: Skin is not pale.     Findings: No abrasion, bruising, ecchymosis, erythema, lesion or rash.     Nails: There is no clubbing.  Neurological:     General: No focal deficit present.     Mental Status: He is alert and oriented to person, place, and time.  Psychiatric:        Attention and Perception: He is attentive.        Mood and Affect: Mood normal.         Speech: Speech normal.        Behavior: Behavior normal. Behavior is cooperative.        Thought Content: Thought content normal.        Judgment: Judgment normal.    Right leg bandaged  Lab Results Lab Results  Component Value Date   WBC 25.3 (H) 10/11/2022   HGB 9.1 (L) 10/11/2022   HCT 28.7 (L) 10/11/2022   MCV 92.9 10/11/2022   PLT 201 10/11/2022    Lab Results  Component Value Date   CREATININE 1.14 10/11/2022   BUN 21 (H) 10/11/2022   NA 127 (L) 10/11/2022   K 4.5 10/11/2022   CL 96 (L) 10/11/2022   CO2 22 10/11/2022    Lab Results  Component Value Date   ALT 26 10/08/2022   AST 32 10/08/2022   ALKPHOS 51 10/08/2022   BILITOT 1.4 (H) 10/08/2022     Microbiology: Recent Results (from the past 240 hour(s))  Culture, blood (Routine x 2)     Status: None (Preliminary result)   Collection Time: 10/08/22  5:15 PM   Specimen: BLOOD  Result Value Ref Range Status   Specimen Description BLOOD RIGHT FOOT  Final   Special Requests   Final    BOTTLES DRAWN AEROBIC AND ANAEROBIC Blood Culture results may not be optimal due to an inadequate volume of blood received in culture bottles   Culture  Setup Time   Final    GRAM POSITIVE COCCI IN BOTH AEROBIC AND ANAEROBIC BOTTLES CRITICAL VALUE NOTED.  VALUE IS CONSISTENT WITH PREVIOUSLY REPORTED AND CALLED VALUE.    Culture   Final    GRAM POSITIVE COCCI IDENTIFICATION TO FOLLOW Performed at [facility], [address] [place], [place] [address]    Report Status PENDING  Incomplete  Culture, blood (Routine x 2)     Status: Abnormal   Collection Time: 10/08/22  5:19 PM   Specimen: BLOOD  Result Value Ref Range Status   Specimen Description BLOOD RIGHT ANTECUBITAL  Final   Special Requests   Final    BOTTLES DRAWN AEROBIC AND ANAEROBIC Blood Culture adequate volume   Culture  Setup Time   Final    IN BOTH AEROBIC AND ANAEROBIC BOTTLES GRAM POSITIVE COCCI Organism ID to follow CRITICAL RESULT CALLED TO, READ  BACK BY AND VERIFIED WITH:  C/ PHARMD H. VON DOHLEN 10/09/22 1608 A. LAFRANCE Performed at [facility], [address] [place], [place] [address]    Culture STREPTOCOCCUS MITIS/ORALIS (A)  Final   Report  Status 10/11/2022 FINAL  Final   Organism ID, Bacteria STREPTOCOCCUS MITIS/ORALIS  Final      Susceptibility   Streptococcus mitis/oralis - MIC*    TETRACYCLINE >=16 RESISTANT Resistant     VANCOMYCIN 0.5 SENSITIVE Sensitive     CLINDAMYCIN 0.5 INTERMEDIATE Intermediate     PENICILLIN Value in next row Sensitive      SENSITIVEMIC <=/ 0.06    * STREPTOCOCCUS MITIS/ORALIS  Blood Culture ID Panel (Reflexed)     Status: Abnormal   Collection Time: 10/08/22  5:19 PM  Result Value Ref Range Status   Enterococcus faecalis NOT DETECTED NOT DETECTED Final   Enterococcus Faecium NOT DETECTED NOT DETECTED Final   Listeria monocytogenes NOT DETECTED NOT DETECTED Final   Staphylococcus species NOT DETECTED NOT DETECTED Final   Staphylococcus aureus (BCID) NOT DETECTED NOT DETECTED Final   Staphylococcus epidermidis NOT DETECTED NOT DETECTED Final   Staphylococcus lugdunensis NOT DETECTED NOT DETECTED Final   Streptococcus species DETECTED (A) NOT DETECTED Final    Comment: Not Enterococcus species, Streptococcus agalactiae, Streptococcus pyogenes, or Streptococcus pneumoniae. CRITICAL RESULT CALLED TO, READ BACK BY AND VERIFIED WITH:  C/ PHARMD H. VON DOHLEN 10/09/22 1608 A. LAFRANCE    Streptococcus agalactiae NOT DETECTED NOT DETECTED Final   Streptococcus pneumoniae NOT DETECTED NOT DETECTED Final   Streptococcus pyogenes NOT DETECTED NOT DETECTED Final   A.calcoaceticus-baumannii NOT DETECTED NOT DETECTED Final   Bacteroides fragilis NOT DETECTED NOT DETECTED Final   Enterobacterales NOT DETECTED NOT DETECTED Final   Enterobacter cloacae complex NOT DETECTED NOT DETECTED Final   Escherichia coli NOT DETECTED NOT DETECTED Final   Klebsiella aerogenes NOT DETECTED NOT DETECTED  Final   Klebsiella oxytoca NOT DETECTED NOT DETECTED Final   Klebsiella pneumoniae NOT DETECTED NOT DETECTED Final   Proteus species NOT DETECTED NOT DETECTED Final   Salmonella species NOT DETECTED NOT DETECTED Final   Serratia marcescens NOT DETECTED NOT DETECTED Final   Haemophilus influenzae NOT DETECTED NOT DETECTED Final   Neisseria meningitidis NOT DETECTED NOT DETECTED Final   Pseudomonas aeruginosa NOT DETECTED NOT DETECTED Final   Stenotrophomonas maltophilia NOT DETECTED NOT DETECTED Final   Candida albicans NOT DETECTED NOT DETECTED Final   Candida auris NOT DETECTED NOT DETECTED Final   Candida glabrata NOT DETECTED NOT DETECTED Final   Candida krusei NOT DETECTED NOT DETECTED Final   Candida parapsilosis NOT DETECTED NOT DETECTED Final   Candida tropicalis NOT DETECTED NOT DETECTED Final   Cryptococcus neoformans/gattii NOT DETECTED NOT DETECTED Final    Comment: Performed at [facility], [address] [place], [place] [address]  Surgical pcr screen     Status: Abnormal   Collection Time: 10/10/22 10:57 AM   Specimen: Nasal Mucosa; Nasal Swab  Result Value Ref Range Status   MRSA, PCR NEGATIVE NEGATIVE Final   Staphylococcus aureus POSITIVE (A) NEGATIVE Final    Comment: RESULT CALLED TO, READ BACK BY AND VERIFIED WITH: 10/10/22 AT 1228 TO RN Hendricks Limes, ADC (NOTE) The Xpert SA Assay (FDA approved for NASAL specimens in patients 77 years of age and older), is one component of a comprehensive surveillance program. It is not intended to diagnose infection nor to guide or monitor treatment. Performed at [facility], [address], [place], [place] [address]   Aerobic/Anaerobic Culture w Gram Stain (surgical/deep wound)     Status: None (Preliminary result)   Collection Time: 10/10/22 11:33 AM   Specimen: Leg, Right; Tissue  Result Value  Ref Range Status   Specimen Description TISSUE ABSCESS  Final   Special Requests RIGHT CALF, FLAGYL  Final    Gram Stain   Final    ABUNDANT WBC SEEN ABUNDANT GRAM POSITIVE COCCI Performed at [facility], [address] [place], [place] [address]    Culture PENDING  Incomplete   Report Status PENDING  Incomplete    Acey Lav, MD [facility] [phone] pager  10/11/2022, 2:23 PM

## 2022-10-11 NOTE — Progress Notes (Signed)
Initial Nutrition Assessment  DOCUMENTATION CODES:   Obesity unspecified  INTERVENTION:  Continue Carb modified diet as ordered Continue Vitamin C and Zinc to support wound healing Juven BID to support wound healing 30 ml ProSource Plus TID, each supplement provides 100 kcals and 15 grams protein.  "Plate Method" and "Carbohydrate Counting for People with Diabetes" handout added to AVS  NUTRITION DIAGNOSIS:   Increased nutrient needs related to wound healing, post-op healing as evidenced by estimated needs.  GOAL:   Patient will meet greater than or equal to 90% of their needs  MONITOR:   PO intake, Supplement acceptance, Labs, Weight trends  REASON FOR ASSESSMENT:   Malnutrition Screening Tool    ASSESSMENT:   Pt admitted with gangrene of R foot. PMH significant for DM, gastroparesis, CVA, MRSA cellulitis (2021).  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 No family present at time of visit.   Observed breakfast tray on counter. Pt consumed all but the eggs for breakfast (~90% consumed).   Unfortunately, there is limited documentation of weight history on file to review. Pt's weight on 08/17/21 was documented to be 88.5 kg. Current weight is 97.5 kg. Will continue to monitor throughout admission.   Edema: mild pitting BLE  Medications: Vitamin C 1000mg  daily, SSI 0-15 units TID, SSI 0-5 units qhs, semglee 30 units qhs, Juven, protonix, Zinc 220mg  daily  Labs: sodium 127, BUN 21, HgbA1c 10.4%, CBG's 198-335 x24 hours  NUTRITION - FOCUSED PHYSICAL EXAM: Deferred to follow up.   Diet Order:   Diet Order             Diet Carb Modified Fluid consistency: Thin; Room service appropriate? Yes  Diet effective now                   EDUCATION NEEDS:   No education needs have been identified at this time  Skin:  Skin Assessment: Skin  Integrity Issues: Skin Integrity Issues:: Wound VAC Wound Vac: R leg  Last BM:  5/5 (type 6 smear)  Height:   Ht Readings from Last 1 Encounters:  10/08/22 5\' 9"  (1.753 m)    Weight:   Wt Readings from Last 1 Encounters:  10/08/22 97.5 kg    Ideal Body Weight:  72.7 kg  BMI:  Body mass index is 31.74 kg/m.  Estimated Nutritional Needs:   Kcal:  2000-2200  Protein:  110-125g  Fluid:  >/=2L  Drusilla Kanner, RDN, LDN Clinical Nutrition

## 2022-10-11 NOTE — Progress Notes (Signed)
Patient ID: Carlos [name], male   DOB: 1976/01/29, 47 y.o.   MRN: [id number] Patient is postoperative day 1 transtibial amputation on the right.  Wound cultures are showing gram-positive cocci.  The wound VAC has 125 cc of bloody drainage.  Hemoglobin stable at 9.1.  White cell count is slightly improved at 25.6 however patient has a significant worsening of the absolute neutrophil to lymphocyte count with neutrophils of 24 and lymphocytes 0.5.  Will plan to return to the operating room on Wednesday.  Discussed that we will continue with limb salvage intervention of the transtibial amputation.  Discussed that we most likely would need to return to the operating room Wednesday and Friday.  Discussed that patient still is septic from this infection.

## 2022-10-11 NOTE — Progress Notes (Signed)
Inpatient Rehab Admissions Coordinator:    I spoke with Pt. Regarding potential CIR admit. States he is interested and that his daughter and brother can provide assist in the evenings, but he will need to be able to be alone during the days. He is listed as uninsured and appears to have a [employer] policy through his job that terminated 09/29/22. I will provide a cost estimate and pursue for potential admit.   Megan Salon, MS, CCC-SLP Rehab Admissions Coordinator  [phone] (celll) [phone] (office)

## 2022-10-11 NOTE — Progress Notes (Signed)
TRH night cross cover note:   I was notified by RN of the patient's request for reevaluation of the blisters on his right forearm.  Correspondingly I evaluated the patient at bedside.   Per my discussions with the patient's RN, the patient had a peripheral IV in the right arm which infiltrated on the morning of 10/10/2022, although the specific medication or IV fluid that was running at the time of infiltration is not clear per my discussions with the RN and per chart review.   Patient reports that shortly after infiltration of this peripheral IV in the right upper extremity, that he developed blisters involving the anterior aspect of the right upper extremity distal to the right elbow.  He conveys that the daytime hospitalist evaluated these blisters, and was reassured with their appearance.  Right upper extremity has been wrapped in gauze since that time, and upon removal of gauze this evening, the patient conveys that the appearance of the blisters on the anterior aspect of the right upper extremity distal to the right elbow is very similar to the appearance of these lesions on the morning of 10/10/2022.  Denies any associated discomfort, erythema, or drainage from the sites denies any associated acute focal numbness or paresthesias.  In discussing options with the patient regarding his right upper extremity blisters, he refused offer for Hibiclens as well as for warm compresses.  Rather, he requested that the right upper extremity be rewrapped with gauze, which will be completed at this time.  I encouraged the patient to please notify his RN if he develops any discomfort associate with these blisters or if he feels that there is any qualitative change in their appearance.    Newton Pigg, DO Hospitalist

## 2022-10-11 NOTE — Progress Notes (Signed)
PROGRESS NOTE  Carlos [name]  DOB: [birth year]  PCP: System, Provider Not In ZOX:[id number]  DOA: 10/08/2022  LOS: 3 days  Hospital Day: 4  Brief narrative: Carlos [name] is a 47 y.o. male with PMH significant for untreated DM2, gastroparesis, CVA, MRSA cellulitis 2021 5/3, patient presented to the ED with complaint of progressive swelling and blackening of right big toe for 3 weeks. 2 weeks ago, hewas seen at urgent care center and was prescribed a course of doxycycline which hecould not tolerate because of nausea and vomiting. His wound continued to grow. He also started having fever, chills, and hence came to the ED.  In the ED, patient had a fever of 102.6, heart rate 126, blood pressure in 90s, breathing on room air She was noted to have edematous right great toe with weeping, foul order and eschar Initial labs with WC count 27.7, hemoglobin 10.1, lactic acid 3.3, blood glucose elevated over 500, sodium 123, creatinine 2.05, troponin 160 3.  Repeat lactic acid elevated up to 7.4, subsequently down to 2.5 Blood culture was collected Urinalysis showed cloudy yellow urine negative for leukocytes, negative nitrite, rare bacteria  X-ray right foot showed numerous pockets of air in the soft tissue.  Right foot and right ankle suggestive of possible gas gangrene.  No fracture or dislocation seen.  CT right foot with contrast showed  1. Extensive subcutaneous and intramuscular gas within the intrinsic musculature of the right foot as well as the terminal visualized right lower extremity musculature. The findings are in keeping with aggressive infection such as necrotizing fasciitis. The extent of disease extends beyond the cephalad margin of this examination. 2. Gas within the first metatarsal head, as well as the proximal and distal phalanx of the great toe suspicious for changes of osteomyelitis. 3. Extensive subcutaneous edema within the visualized right foot. No drainable fluid collection. 4.  Advanced vascular calcifications.   Patient was given IV fluid bolus, broad-spectrum IV antibiotics Admitted to [facility] Orthopedics was consulted. 5/5, patient underwent right BKA by Dr. Lajoyce Corners  Subjective: Patient was seen and examined this morning. Looks frustrated.  Pain controlled Follow-up by Dr. Lajoyce Corners this morning noted. Blood culture sent on admission is growing gram-positive cocci.  Wound culture growing the same as well.  Assessment and plan: Severe sepsis - POA Necrotizing fasciitis of right lower extremity Gram-positive bacteremia Presented with worsening right foot wound.  Imaging as above showing gas gangrene as well as necrotizing fasciitis 5/5, underwent right BKA by Dr. Lajoyce Corners.   Blood culture sent on admission is growing gram-positive cocci.  Wound culture growing the same as well. Per Dr. Audrie Lia note this morning, patient needs return to the OR on Wednesday to continue limb salvage intervention. ID consulted.  Currently on broad-spectrum antibiotics. WBC count remains elevated.  Lactate level improved. Recent Labs  Lab 10/08/22 1719 10/08/22 1945 10/09/22 0118 10/09/22 0430 10/09/22 0803 10/10/22 0150 10/11/22 0049  WBC 27.7*  --   --   --  27.7* 28.7* 25.3*  LATICACIDVEN 3.3* 7.4* 2.5* 2.7* 3.1* 1.8  --    AKI Lactic acidosis Due to tissue hypoperfusion and renal hypoperfusion. Creatinine was normal in March 2023.  Presented with creatinine elevated 2.05.  Gradually down trended to normal with IV hydration. Recent Labs    10/08/22 1719 10/08/22 2109 10/09/22 0430 10/09/22 0803 10/10/22 0150 10/11/22 0049  BUN 19 23* 23* 19 16 21*  CREATININE 2.05* 1.88* 1.57* 1.43* 1.28* 1.14    Acute hyponatremia baseline sodium level was  normal.  Presented with low level of 123.   Serum osmolality is normal but urine osmolality is elevated.   Sodium level fluctuating, at 126 today.  Continue to monitor Recent Labs  Lab 10/08/22 1719 10/08/22 2109 10/09/22 0430  10/09/22 0803 10/10/22 0150 10/11/22 0049  NA 123* 123* 125* 129* 126* 127*   Elevated troponin Demand ischemia likely secondary to sepsis/infection.  No active anginal symptoms. Recent Labs    10/08/22 1728  TROPONINIHS 163*   Type 2 diabetes mellitus uncontrolled with hyperglycemia A1c 10.4 on 10/08/2022.  Presented with a blood sugar level elevated over 500 PTA not on any meds. Currently on Semglee 20 units nightly.  Blood sugar running elevated.  Increase Semglee to 30 units nightly.  Continue moderate SSI/Accu-Cheks Recent Labs  Lab 10/10/22 1957 10/10/22 2359 10/11/22 0347 10/11/22 0751 10/11/22 1117  GLUCAP 335* 308* 332* 287* 290*   Diabetic gastroparesis PTA not on any meds.  He states he did not have any symptoms prior to taking doxycycline.   H/o CVA Not on any meds    Mobility: PT eval postprocedure  Goals of care   Code Status: Full Code    DVT prophylaxis:  SCD's Start: 10/10/22 1342 heparin injection 5,000 Units Start: 10/09/22 2200   Antimicrobials: IV cefepime, IV vancomycin, IV Flagyl Fluid: Normal saline at 125 mill per hour.  Continue same today Consultants: Orthopedics Family Communication: None at bedside  Status: Inpatient Level of care:  Telemetry Medical   Patient from: Home Anticipated d/c to: Pending clinical course Needs to continue in-hospital care:  Needs surgical intervention    Diet:  Diet Order             Diet Carb Modified Fluid consistency: Thin; Room service appropriate? Yes  Diet effective now                   Scheduled Meds:  vitamin C  1,000 mg Oral Daily   docusate sodium  100 mg Oral Daily   heparin  5,000 Units Subcutaneous Q8H   insulin aspart  0-15 Units Subcutaneous TID WC   insulin aspart  0-5 Units Subcutaneous QHS   insulin glargine-yfgn  30 Units Subcutaneous QHS   metroNIDAZOLE  500 mg Oral Q12H   nutrition supplement (JUVEN)  1 packet Oral BID BM   pantoprazole  40 mg Oral Daily   zinc  sulfate  220 mg Oral Daily    PRN meds: acetaminophen, alum & mag hydroxide-simeth, bisacodyl, guaiFENesin-dextromethorphan, hydrALAZINE, HYDROmorphone (DILAUDID) injection, labetalol, magnesium citrate, magnesium sulfate bolus IVPB, metoprolol tartrate, morphine injection, ondansetron, oxyCODONE, oxyCODONE, phenol, polyethylene glycol, potassium chloride, senna-docusate   Infusions:   sodium chloride Stopped (10/10/22 0906)   sodium chloride 75 mL/hr at 10/11/22 0630   cefTRIAXone (ROCEPHIN)  IV     magnesium sulfate bolus IVPB      Antimicrobials: Anti-infectives (From admission, onward)    Start     Dose/Rate Route Frequency Ordered Stop   10/11/22 2100  metroNIDAZOLE (FLAGYL) tablet 500 mg        500 mg Oral Every 12 hours 10/11/22 1121     10/11/22 1600  cefTRIAXone (ROCEPHIN) 2 g in sodium chloride 0.9 % 100 mL IVPB        2 g 200 mL/hr over 30 Minutes Intravenous Every 24 hours 10/11/22 0958     10/10/22 1345  ceFAZolin (ANCEF) IVPB 2g/100 mL premix  Status:  Discontinued        2 g 200 mL/hr  over 30 Minutes Intravenous Every 8 hours 10/10/22 1341 10/10/22 1528   10/10/22 1045  ceFAZolin (ANCEF) IVPB 2g/100 mL premix  Status:  Discontinued        2 g 200 mL/hr over 30 Minutes Intravenous On call to O.R. 10/10/22 0947 10/10/22 1013   10/10/22 0955  metroNIDAZOLE (FLAGYL) 500 MG/100ML IVPB       Note to Pharmacy: Launa Flight M: cabinet override      10/10/22 0955 10/10/22 1115   10/09/22 2100  vancomycin (VANCOREADY) IVPB 1250 mg/250 mL  Status:  Discontinued        1,250 mg 166.7 mL/hr over 90 Minutes Intravenous Every 24 hours 10/09/22 1604 10/11/22 0957   10/09/22 1800  ceFEPIme (MAXIPIME) 2 g in sodium chloride 0.9 % 100 mL IVPB  Status:  Discontinued        2 g 200 mL/hr over 30 Minutes Intravenous Every 8 hours 10/09/22 1616 10/11/22 0957   10/09/22 0600  ceFEPIme (MAXIPIME) 2 g in sodium chloride 0.9 % 100 mL IVPB  Status:  Discontinued        2 g 200 mL/hr over 30  Minutes Intravenous Every 12 hours 10/08/22 2103 10/09/22 1616   10/08/22 2103  vancomycin variable dose per unstable renal function (pharmacist dosing)  Status:  Discontinued         Does not apply See admin instructions 10/08/22 2103 10/09/22 1604   10/08/22 2015  vancomycin (VANCOREADY) IVPB 2000 mg/400 mL        2,000 mg 200 mL/hr over 120 Minutes Intravenous  Once 10/08/22 2005 10/08/22 2250   10/08/22 1800  ceFEPIme (MAXIPIME) 2 g in sodium chloride 0.9 % 100 mL IVPB        2 g 200 mL/hr over 30 Minutes Intravenous  Once 10/08/22 1748 10/08/22 1916   10/08/22 1745  metroNIDAZOLE (FLAGYL) IVPB 500 mg  Status:  Discontinued        500 mg 100 mL/hr over 60 Minutes Intravenous Every 12 hours 10/08/22 1736 10/11/22 1121       Nutritional status:  Body mass index is 31.74 kg/m.          Objective: Vitals:   10/11/22 0350 10/11/22 0749  BP: 100/70 119/74  Pulse: 94 89  Resp: 18   Temp: 98.1 F (36.7 C) 97.8 F (36.6 C)  SpO2: 99% 93%    Intake/Output Summary (Last 24 hours) at 10/11/2022 1133 Last data filed at 10/11/2022 0630 Gross per 24 hour  Intake 2977.47 ml  Output 25 ml  Net 2952.47 ml   Filed Weights   10/08/22 1708 10/08/22 1947  Weight: 97.5 kg 97.5 kg   Weight change:  Body mass index is 31.74 kg/m.   Physical Exam: General exam: Middle-aged African-American male.  Looks older for his stated age Skin: No rashes, lesions or ulcers. HEENT: Atraumatic, normocephalic, no obvious bleeding Lungs: Clear to auscultation bilaterally. CVS: Regular rate and rhythm, no murmur GI/Abd soft, nontender, nondistended, bowel sound present CNS: Alert, awake, oriented x 3 Psychiatry: Sad affect Extremities: Right leg new BKA status  Data Review: I have personally reviewed the laboratory data and studies available.  F/u labs ordered Unresulted Labs (From admission, onward)     Start     Ordered   10/11/22 0959  Culture, blood (Routine X 2) w Reflex to ID Panel   BLOOD CULTURE X 2,   R      10/11/22 0958   10/11/22 0500  Basic metabolic panel  Daily,   R     Question:  Specimen collection method  Answer:  Lab=Lab collect   10/10/22 1341   10/11/22 0500  CBC  Daily,   R     Question:  Specimen collection method  Answer:  Lab=Lab collect   10/10/22 1341   10/10/22 0500  CBC with Differential/Platelet  Daily,   R      10/09/22 0848   10/10/22 0500  Basic metabolic panel  Daily,   R      10/09/22 0848   10/08/22 1719  Pathologist smear review  Once,   R        10/08/22 1719            Total time spent in review of labs and imaging, patient evaluation, formulation of plan, documentation and communication with family: 45 minutes  Signed, Lorin Glass, MD [facility] 10/11/2022

## 2022-10-12 DIAGNOSIS — L97514 Non-pressure chronic ulcer of other part of right foot with necrosis of bone: Secondary | ICD-10-CM

## 2022-10-12 LAB — SURGICAL PATHOLOGY

## 2022-10-12 LAB — BASIC METABOLIC PANEL
Anion gap: 6 (ref 5–15)
BUN: 16 mg/dL (ref 6–20)
CO2: 24 mmol/L (ref 22–32)
Calcium: 7.1 mg/dL — ABNORMAL LOW (ref 8.9–10.3)
Chloride: 98 mmol/L (ref 98–111)
Creatinine, Ser: 0.99 mg/dL (ref 0.61–1.24)
GFR, Estimated: >60 mL/min (ref >60)
Glucose, Bld: 233 mg/dL — ABNORMAL HIGH (ref 70–99)
Potassium: 3.8 mmol/L (ref 3.5–5.1)
Sodium: 128 mmol/L — ABNORMAL LOW (ref 135–145)

## 2022-10-12 LAB — GLUCOSE, CAPILLARY
Glucose-Capillary: 197 mg/dL — ABNORMAL HIGH (ref 70–99)
Glucose-Capillary: 203 mg/dL — ABNORMAL HIGH (ref 70–99)
Glucose-Capillary: 166 mg/dL — ABNORMAL HIGH (ref 70–99)
Glucose-Capillary: 161 mg/dL — ABNORMAL HIGH (ref 70–99)
Glucose-Capillary: 156 mg/dL — ABNORMAL HIGH (ref 70–99)

## 2022-10-12 LAB — CBC WITH DIFFERENTIAL/PLATELET
Abs Immature Granulocytes: 0.9 10*3/uL — ABNORMAL HIGH (ref 0.00–0.07)
Basophils Absolute: 0 10*3/uL (ref 0.0–0.1)
Basophils Relative: 0 %
Eosinophils Absolute: 0 10*3/uL (ref 0.0–0.5)
Eosinophils Relative: 0 %
HCT: 25.9 % — ABNORMAL LOW (ref 39.0–52.0)
Hemoglobin: 8.8 g/dL — ABNORMAL LOW (ref 13.0–17.0)
Immature Granulocytes: 5 %
Lymphocytes Relative: 11 %
Lymphs Abs: 2.2 10*3/uL (ref 0.7–4.0)
MCH: 29.9 pg (ref 26.0–34.0)
MCHC: 34 g/dL (ref 30.0–36.0)
MCV: 88.1 fL (ref 80.0–100.0)
Monocytes Absolute: 1.1 10*3/uL — ABNORMAL HIGH (ref 0.1–1.0)
Monocytes Relative: 5 %
Neutro Abs: 15.9 10*3/uL — ABNORMAL HIGH (ref 1.7–7.7)
Neutrophils Relative %: 79 %
Platelets: 322 10*3/uL (ref 150–400)
RBC: 2.94 MIL/uL — ABNORMAL LOW (ref 4.22–5.81)
RDW: 12.9 % (ref 11.5–15.5)
WBC: 20.2 10*3/uL — ABNORMAL HIGH (ref 4.0–10.5)
nRBC: 0.1 % (ref 0.0–0.2)

## 2022-10-12 LAB — CULTURE, BLOOD (ROUTINE X 2): Culture: NO GROWTH

## 2022-10-12 MED ORDER — TRANEXAMIC ACID 1000 MG/10ML IV SOLN
2000.0000 mg | INTRAVENOUS | Status: DC
  Filled 2022-10-12: qty 20

## 2022-10-12 MED ORDER — MUPIROCIN 2 % EX OINT
1.0000 | TOPICAL_OINTMENT | Freq: Two times a day (BID) | CUTANEOUS | Status: DC
  Administered 2022-10-12 – 2022-10-16 (×5): 1 via NASAL
  Filled 2022-10-12: qty 22

## 2022-10-12 MED ORDER — METOCLOPRAMIDE HCL 5 MG/ML IJ SOLN: 5.0000 mg | Freq: Three times a day (TID) | INTRAMUSCULAR | Status: DC

## 2022-10-12 MED ORDER — CHLORHEXIDINE GLUCONATE CLOTH 2 % EX PADS
6.0000 | MEDICATED_PAD | Freq: Every day | CUTANEOUS | Status: DC
  Administered 2022-10-13: 6 via TOPICAL

## 2022-10-12 NOTE — Progress Notes (Addendum)
PT Cancellation Note  Patient Details Name: Carlos [name] MRN: [id number] DOB: Oct 27, 1975   Cancelled Treatment:    Reason Eval/Treat Not Completed: Patient declined, no reason specified Patient talking on phone. Says he does not want to get up despite encouragement. Will re-attempt a little later.    Addendum: patient generally refusing all education/care today. He is planned to have another procedure tomorrow. Continuing to follow.   Rael Tilly 10/12/2022, 9:33 AM

## 2022-10-12 NOTE — Progress Notes (Signed)
Patient ID: Carlos [name], male   DOB: 06/11/1975, 47 y.o.   MRN: [id number] Patient is seen in follow-up status post transtibial amputation for necrotizing fasciitis right lower extremity.  Patient's wound VAC has 200 cc which was 125 yesterday.  The neutrophil lymphocyte absolute count is improving currently 15.9-2.2.  White cell count has decreased to 20.  Hemoglobin stable at 8.8.  Cultures are showing strep.  Will plan for return to the operating room tomorrow for further debridement of the transtibial amputation with the goal to maintain the transtibial amputation level.  Possible repeat debridement on Friday.

## 2022-10-12 NOTE — PMR Pre-admission (Signed)
PMR Admission Coordinator Pre-Admission Assessment  Patient: Carlos [name] is an 47 y.o., male MRN: [id number] DOB: [birth year] Height: 5\' 9"  (1.753 m) Weight: 97.5 kg  Insurance Information HMO:     PPO:      PCP:      IPA:      80/20:      OTHER:  PRIMARY: Uninsured   SECONDARY:       Policy#:       Phone#:   Artist:       Phone#:   The Data processing manager" for patients in Inpatient Rehabilitation Facilities with attached "Privacy Act Statement-Health Care Records" was provided and verbally reviewed with: N/A  Emergency Contact Information Contact Information     Name Relation Home Work Mobile   Packman,Nicholas Brother [phone]     Siravo,autumn Daughter   [phone]   Silva Bandy   [phone]       Current Medical History  Patient Admitting Diagnosis: R BKA   History of Present Illness:  A 47 year old male with past medical history of diabetes mellitus, [untreated], gastroparesis, history of CVA, history of MRSA cellulitis 2021. Weeks ago patient noticed on the bottom of his right foot.  He went to urgent care the next day and was scribed doxycycline.  Doxycycline caused nausea and vomiting, shoulder statement effectively.  His wound got worse, he has been referred to the wound care.. Endorses fever, chills, nausea, vomiting, weakness, foot pain  worsening wound.  After much prompting by his children he agreed to come to the ER.  On 10/08/22 in the ER x-ray shows there are numerous pockets of air in the soft tissues in right foot and right ankle suggesting possible gas gangrene. Tmax 102.7, WBC 27.7, creatinine 2.05, glucose 474, NA 123, troponin was 63, LA 3.3. FSBS 506HR 127, RR 22, Tmax as above.  Patient bolused 2 L LR.  Blood cultures x 2 collected.  Cefepime, Flagyl and Vanco given.  Patient underwent R BKA with Dr. Lajoyce Corners on 10/10/22. Patient was seen by PT/OT post op and they recommend CIR to assist with return to PLOF.   Patient's  medical record from [facility] has been reviewed by the rehabilitation admission coordinator and physician.  Past Medical History  Past Medical History:  Diagnosis Date   Diabetes mellitus     Has the patient had major surgery during 100 days prior to admission? Yes  Family History   family history is not on file.  Current Medications  Current Facility-Administered Medications:    (feeding supplement) PROSource Plus liquid 30 mL, 30 mL, Oral, TID BM, Dahal, Binaya, MD   0.9 %  sodium chloride infusion, , Intravenous, Continuous, [name], MD, Stopped at 10/10/22 0906   0.9 %  sodium chloride infusion, , Intravenous, Continuous, [name], MD, Last Rate: 75 mL/hr at 10/11/22 0630, New Bag at 10/11/22 0630   acetaminophen (TYLENOL) tablet 325-650 mg, 325-650 mg, Oral, Q6H PRN, [name], MD   alum & mag hydroxide-simeth (MAALOX/MYLANTA) 200-200-20 MG/5ML suspension 15-30 mL, 15-30 mL, Oral, Q2H PRN, [name], MD   ascorbic acid (VITAMIN C) tablet 1,000 mg, 1,000 mg, Oral, Daily, [name], MD, 1,000 mg at 10/11/22 0802   bisacodyl (DULCOLAX) EC tablet 5 mg, 5 mg, Oral, Daily PRN, [name], MD   cefTRIAXone (ROCEPHIN) 2 g in sodium chloride 0.9 % 100 mL IVPB, 2 g, Intravenous, Q24H, Daiva Eves, Lisette Grinder, MD, Last Rate: 200 mL/hr  at 10/11/22 1705, 2 g at 10/11/22 1705   docusate sodium (COLACE) capsule 100 mg, 100 mg, Oral, Daily, [name], MD, 100 mg at 10/11/22 0802   guaiFENesin-dextromethorphan (ROBITUSSIN DM) 100-10 MG/5ML syrup 15 mL, 15 mL, Oral, Q4H PRN, [name], MD   heparin injection 5,000 Units, 5,000 Units, Subcutaneous, Q8H, [name], MD, 5,000 Units at 10/11/22 2150   hydrALAZINE (APRESOLINE) injection 5 mg, 5 mg, Intravenous, Q20 Min PRN, [name], MD   HYDROmorphone (DILAUDID) injection 0.5-1 mg, 0.5-1 mg, Intravenous, Q4H PRN, [name], MD   insulin aspart (novoLOG) injection 0-15 Units, 0-15  Units, Subcutaneous, TID WC, Dahal, Binaya, MD, 5 Units at 10/12/22 0807   insulin aspart (novoLOG) injection 0-5 Units, 0-5 Units, Subcutaneous, QHS, Dahal, Melina Schools, MD, 2 Units at 10/11/22 2145   insulin glargine-yfgn (SEMGLEE) injection 30 Units, 30 Units, Subcutaneous, QHS, Dahal, Binaya, MD, 30 Units at 10/11/22 2150   insulin starter kit- pen needles (English) 1 kit, 1 kit, Other, Once, Dahal, Melina Schools, MD   labetalol (NORMODYNE) injection 10 mg, 10 mg, Intravenous, Q10 min PRN, [name], MD   living well with diabetes book MISC, , Does not apply, Once, Dahal, Melina Schools, MD   magnesium citrate solution 1 Bottle, 1 Bottle, Oral, Once PRN, [name], MD   magnesium sulfate IVPB 2 g 50 mL, 2 g, Intravenous, Daily PRN, [name], MD   metoCLOPramide (REGLAN) injection 5 mg, 5 mg, Intravenous, Q8H, Dahal, Binaya, MD   metoprolol tartrate (LOPRESSOR) injection 2-5 mg, 2-5 mg, Intravenous, Q2H PRN, [name], MD   metroNIDAZOLE (FLAGYL) IVPB 500 mg, 500 mg, Intravenous, BID, Dahal, Binaya, MD, Last Rate: 100 mL/hr at 10/11/22 2058, 500 mg at 10/11/22 2058   morphine (PF) 2 MG/ML injection 2 mg, 2 mg, Intravenous, Q4H PRN, [name], MD   nutrition supplement (JUVEN) (JUVEN) powder packet 1 packet, 1 packet, Oral, BID BM, [name], MD, 1 packet at 10/12/22 0810   ondansetron [facility]) injection 4 mg, 4 mg, Intravenous, Q6H PRN, [name], MD   oxyCODONE (Oxy IR/ROXICODONE) immediate release tablet 10-15 mg, 10-15 mg, Oral, Q4H PRN, [name], MD   oxyCODONE (Oxy IR/ROXICODONE) immediate release tablet 5-10 mg, 5-10 mg, Oral, Q4H PRN, [name], MD   pantoprazole (PROTONIX) EC tablet 40 mg, 40 mg, Oral, Daily, [name], MD, 40 mg at 10/11/22 0802   phenol (CHLORASEPTIC) mouth spray 1 spray, 1 spray, Mouth/Throat, PRN, [name], MD   polyethylene glycol (MIRALAX / GLYCOLAX) packet 17 g, 17 g, Oral, Daily PRN, [name], MD   potassium chloride  SA (KLOR-CON M) CR tablet 20-40 mEq, 20-40 mEq, Oral, Daily PRN, [name], MD   senna-docusate (Senokot-S) tablet 1 tablet, 1 tablet, Oral, QHS PRN, [name], MD   zinc sulfate capsule 220 mg, 220 mg, Oral, Daily, [name], MD, 220 mg at 10/11/22 0802  Patients Current Diet:  Diet Order             Diet Carb Modified           Diet Carb Modified Fluid consistency: Thin; Room service appropriate? Yes  Diet effective now                   Precautions / Restrictions Precautions Precautions: Fall Precaution Comments: R BKA, Wound vac Restrictions Weight Bearing Restrictions: Yes RLE Weight Bearing: Non weight bearing Other  Position/Activity Restrictions: Limb protector   Has the patient had 2 or more falls or a fall with injury in the past year? No  Prior Activity Level Community (5-7x/wk): Pt working and active in the community PTA  Prior Functional Level Self Care: Did the patient need help bathing, dressing, using the toilet or eating? Independent  Indoor Mobility: Did the patient need assistance with walking from room to room (with or without device)? Independent  Stairs: Did the patient need assistance with internal or external stairs (with or without device)? Independent  Functional Cognition: Did the patient need help planning regular tasks such as shopping or remembering to take medications? Independent  Patient Information Are you of Hispanic, Latino/a,or Spanish origin?: A. No, not of Hispanic, Latino/a, or Spanish origin What is your race?: B. Black or African American Do you need or want an interpreter to communicate with a doctor or health care staff?: 0. No  Patient's Response To:  Health Literacy and Transportation Is the patient able to respond to health literacy and transportation needs?: Yes Health Literacy - How often do you need to have someone help you when you read instructions, pamphlets, or other written material from your doctor  or pharmacy?: Never In the past 12 months, has lack of transportation kept you from medical appointments or from getting medications?: No In the past 12 months, has lack of transportation kept you from meetings, work, or from getting things needed for daily living?: No  Journalist, newspaper / Equipment Home Assistive Devices/Equipment: None Home Equipment: None  Prior Device Use: Indicate devices/aids used by the patient prior to current illness, exacerbation or injury? None of the above  Current Functional Level Cognition  Overall Cognitive Status: Within Functional Limits for tasks assessed Orientation Level: Oriented X4 General Comments: Good carry over of most information learned in PT session this morning    Extremity Assessment (includes Sensation/Coordination)  Upper Extremity Assessment: Overall WFL for tasks assessed  Lower Extremity Assessment: Defer to PT evaluation RLE Deficits / Details: s/p transtibial amp. Hip and knee ROM currently WNL RLE Sensation: decreased proprioception, decreased light touch RLE Coordination: WNL    ADLs  Overall ADL's : Needs assistance/impaired Eating/Feeding: Independent, Sitting Grooming: Set up, Sitting Grooming Details (indicate cue type and reason):  (chair at sink) Upper Body Bathing: Set up, Sitting Upper Body Bathing Details (indicate cue type and reason): EOB Lower Body Bathing: Maximal assistance, Sit to/from stand Upper Body Dressing : Set up, Sitting Upper Body Dressing Details (indicate cue type and reason): EOB Lower Body Dressing: Maximal assistance, Sit to/from stand Toilet Transfer: Stand-pivot, Rolling walker (2 wheels), Min guard Toilet Transfer Details (indicate cue type and reason): min guard to toilet from EOB Toileting- Clothing Manipulation and Hygiene: Moderate assistance, Sitting/lateral lean Functional mobility during ADLs: Min guard, Rolling walker (2 wheels) General ADL Comments: Pt able to perform mobility  min guard, tires easily, needs break. Pt able to lean on sink and perform some ADLs standing, but requires use of chair due to decreased endurance and overall balance without BUE support.    Mobility  Overal bed mobility: Modified Independent Bed Mobility: Rolling Rolling: Modified independent (Device/Increase time) Supine to sit: Supervision, HOB elevated General bed mobility comments: Mod I to roll for exercises    Transfers  Overall transfer level: Needs assistance Equipment used: Rolling walker (2 wheels) Transfers: Sit to/from Stand Sit to Stand: Min guard Bed to/from chair/wheelchair/BSC transfer type:: Step pivot Stand pivot transfers: Min guard Step pivot transfers:  Min guard General transfer comment: increased effort, use of RW and min guard for safety/balance    Ambulation / Gait / Stairs / Wheelchair Mobility  Ambulation/Gait Ambulation/Gait assistance: Editor, commissioning (Feet): 5 Feet (fwd and bkwd, 2x) Assistive device: Rolling walker (2 wheels) Gait Pattern/deviations:  (hop to) General Gait Details: pt first hopped on L foot bed to chair. Then worked on taking full wt through upper body and gliding fwd and bkwd for less impact on LLE. L shoe donned before gait Gait velocity: decreased Gait velocity interpretation: <1.31 ft/sec, indicative of household ambulator Pre-gait activities: lfting L foot up and down in place with RW    Posture / Balance Dynamic Sitting Balance Sitting balance - Comments: sitting EOB Balance Overall balance assessment: Needs assistance Sitting-balance support: Feet supported Sitting balance-Leahy Scale: Normal Sitting balance - Comments: sitting EOB Standing balance support: Bilateral upper extremity supported, During functional activity, Reliant on assistive device for balance Standing balance-Leahy Scale: Poor Standing balance comment: reliant on RW    Special needs/care consideration Skin R BKA post op site; right arm skin  peeling.   Previous Home Environment (from acute therapy documentation) Living Arrangements: Children  Lives With: Daughter Available Help at Discharge: Family, Available PRN/intermittently, Friend(s) Type of Home: Apartment Home Layout: One level Home Access: Level entry Bathroom Shower/Tub: Engineer, manufacturing systems: Standard Home Care Services: No Additional Comments: daughter works and attends school but is in and out throughout the day. He also has a brother that visits daily, a fiancee, and a best friend  Discharge Living Setting Plans for Discharge Living Setting: Patient's home Type of Home at Discharge: House Discharge Home Layout: One level Discharge Home Access: Level entry Discharge Bathroom Shower/Tub: Tub/shower unit Discharge Bathroom Toilet: Standard Discharge Bathroom Accessibility: Yes How Accessible: Accessible via walker, Accessible via wheelchair Does the patient have any problems obtaining your medications?: No  Social/Family/Support Systems Patient Roles: Spouse Contact Information: [phone] Anticipated Caregiver: autumn Solano Ability/Limitations of Caregiver: min A Caregiver Availability: Intermittent Discharge Plan Discussed with Primary Caregiver: Yes Is Caregiver In Agreement with Plan?: Yes Does Caregiver/Family have Issues with Lodging/Transportation while Pt is in Rehab?: No  Goals Patient/Family Goal for Rehab: PT/OT Mod I Expected length of stay: 12-14 days Pt/Family Agrees to Admission and willing to participate: Yes Program Orientation Provided & Reviewed with Pt/Caregiver Including Roles  & Responsibilities: Yes  Decrease burden of Care through IP rehab admission: not anticipated   Possible need for SNF placement upon discharge: not anticipated  Patient Condition: I have reviewed medical records from [facility] , spoken with CM, and patient and daughter. I met with patient at the bedside for inpatient  rehabilitation assessment.  Patient will benefit from ongoing PT and OT, can actively participate in 3 hours of therapy a day 5 days of the week, and can make measurable gains during the admission.  Patient will also benefit from the coordinated team approach during an Inpatient Acute Rehabilitation admission.  The patient will receive intensive therapy as well as Rehabilitation physician, nursing, social worker, and care management interventions.  Due to safety, skin/wound care, disease management, medication administration, pain management, and patient education the patient requires 24 hour a day rehabilitation nursing.  The patient is currently min assist 5" with mobility and max assist with basic ADLs.  Discharge setting and therapy post discharge at home with home health is anticipated.  Patient has agreed to participate in the Acute Inpatient Rehabilitation Program and will admit today.  Preadmission Screen Completed By:  Trish Mage, 10/20/2022 11:55 AM ______________________________________________________________________   Discussed status with Dr. Natale Lay on 10/20/22 at 0945 and received approval for admission today.  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 Yes Co-Morbidities requiring supervision/potential complications: AKI, hypomagnesemia, hyponatremia, DM2, diabetes gastroparesis, hx of CVA Due to bladder management, bowel management, safety, skin/wound care, disease management, medication administration, pain management, and patient education, does the patient require 24 hr/day rehab nursing? Yes Does the patient require coordinated care of a physician, rehab nurse, PT, OT, and SLP to address physical and  functional deficits in the context of the above medical diagnosis(es)? Yes Addressing deficits in the following areas: balance, endurance, locomotion, strength, transferring, bowel/bladder control, bathing, dressing, feeding, grooming, toileting, and psychosocial support Can the patient actively participate in an intensive therapy program of at least 3 hrs of therapy 5 days a week? Yes The potential for patient to make measurable gains while on inpatient rehab is excellent Anticipated functional outcomes upon discharge from inpatient rehab: modified independent PT, modified independent OT, n/a SLP Estimated rehab length of stay to reach the above functional goals is: 12-14 Anticipated discharge destination: Home 10. Overall Rehab/Functional Prognosis: good   MD Signature: Fanny Dance

## 2022-10-12 NOTE — Progress Notes (Signed)
Subjective:  Patient with no new complaints  Antibiotics:  Anti-infectives (From admission, onward)    Start     Dose/Rate Route Frequency Ordered Stop   10/11/22 2100  metroNIDAZOLE (FLAGYL) tablet 500 mg  Status:  Discontinued        500 mg Oral Every 12 hours 10/11/22 1121 10/11/22 1856   10/11/22 2000  metroNIDAZOLE (FLAGYL) IVPB 500 mg        500 mg 100 mL/hr over 60 Minutes Intravenous 2 times daily 10/11/22 1856     10/11/22 1600  cefTRIAXone (ROCEPHIN) 2 g in sodium chloride 0.9 % 100 mL IVPB        2 g 200 mL/hr over 30 Minutes Intravenous Every 24 hours 10/11/22 0958     10/10/22 1345  ceFAZolin (ANCEF) IVPB 2g/100 mL premix  Status:  Discontinued        2 g 200 mL/hr over 30 Minutes Intravenous Every 8 hours 10/10/22 1341 10/10/22 1528   10/10/22 1045  ceFAZolin (ANCEF) IVPB 2g/100 mL premix  Status:  Discontinued        2 g 200 mL/hr over 30 Minutes Intravenous On call to O.R. 10/10/22 0947 10/10/22 1013   10/10/22 0955  metroNIDAZOLE (FLAGYL) 500 MG/100ML IVPB       Note to Pharmacy: Launa Flight M: cabinet override      10/10/22 0955 10/10/22 1115   10/09/22 2100  vancomycin (VANCOREADY) IVPB 1250 mg/250 mL  Status:  Discontinued        1,250 mg 166.7 mL/hr over 90 Minutes Intravenous Every 24 hours 10/09/22 1604 10/11/22 0957   10/09/22 1800  ceFEPIme (MAXIPIME) 2 g in sodium chloride 0.9 % 100 mL IVPB  Status:  Discontinued        2 g 200 mL/hr over 30 Minutes Intravenous Every 8 hours 10/09/22 1616 10/11/22 0957   10/09/22 0600  ceFEPIme (MAXIPIME) 2 g in sodium chloride 0.9 % 100 mL IVPB  Status:  Discontinued        2 g 200 mL/hr over 30 Minutes Intravenous Every 12 hours 10/08/22 2103 10/09/22 1616   10/08/22 2103  vancomycin variable dose per unstable renal function (pharmacist dosing)  Status:  Discontinued         Does not apply See admin instructions 10/08/22 2103 10/09/22 1604   10/08/22 2015  vancomycin (VANCOREADY) IVPB 2000 mg/400 mL         2,000 mg 200 mL/hr over 120 Minutes Intravenous  Once 10/08/22 2005 10/08/22 2250   10/08/22 1800  ceFEPIme (MAXIPIME) 2 g in sodium chloride 0.9 % 100 mL IVPB        2 g 200 mL/hr over 30 Minutes Intravenous  Once 10/08/22 1748 10/08/22 1916   10/08/22 1745  metroNIDAZOLE (FLAGYL) IVPB 500 mg  Status:  Discontinued        500 mg 100 mL/hr over 60 Minutes Intravenous Every 12 hours 10/08/22 1736 10/11/22 1121       Medications: Scheduled Meds:  (feeding supplement) PROSource Plus  30 mL Oral TID BM   vitamin C  1,000 mg Oral Daily   docusate sodium  100 mg Oral Daily   heparin  5,000 Units Subcutaneous Q8H   insulin aspart  0-15 Units Subcutaneous TID WC   insulin aspart  0-5 Units Subcutaneous QHS   insulin glargine-yfgn  30 Units Subcutaneous QHS   insulin starter kit- pen needles  1 kit Other Once   living well with  diabetes book   Does not apply Once   metoCLOPramide (REGLAN) injection  5 mg Intravenous Q8H   nutrition supplement (JUVEN)  1 packet Oral BID BM   pantoprazole  40 mg Oral Daily   zinc sulfate  220 mg Oral Daily   Continuous Infusions:  sodium chloride Stopped (10/10/22 0906)   sodium chloride 75 mL/hr at 10/11/22 0630   cefTRIAXone (ROCEPHIN)  IV 2 g (10/11/22 1705)   magnesium sulfate bolus IVPB     metronidazole 500 mg (10/12/22 0957)   PRN Meds:.acetaminophen, alum & mag hydroxide-simeth, bisacodyl, guaiFENesin-dextromethorphan, hydrALAZINE, HYDROmorphone (DILAUDID) injection, labetalol, magnesium citrate, magnesium sulfate bolus IVPB, metoprolol tartrate, morphine injection, ondansetron, oxyCODONE, oxyCODONE, phenol, polyethylene glycol, potassium chloride, senna-docusate    Objective: Weight change:   Intake/Output Summary (Last 24 hours) at 10/12/2022 1040 Last data filed at 10/11/2022 2003 Gross per 24 hour  Intake 240 ml  Output 0 ml  Net 240 ml   Blood pressure 112/72, pulse 77, temperature 97.9 F (36.6 C), temperature source Oral, resp.  rate 17, height 5\' 9"  (1.753 m), weight 97.5 kg, SpO2 99 %. Temp:  [97.7 F (36.5 C)-97.9 F (36.6 C)] 97.9 F (36.6 C) (05/07 0746) Pulse Rate:  [77-88] 77 (05/07 0746) Resp:  [16-20] 17 (05/07 0746) BP: (106-122)/(72-77) 112/72 (05/07 0746) SpO2:  [95 %-99 %] 99 % (05/07 0746)  Physical Exam: Physical Exam Constitutional:      Appearance: He is well-developed.  HENT:     Head: Normocephalic and atraumatic.  Eyes:     Conjunctiva/sclera: Conjunctivae normal.  Cardiovascular:     Rate and Rhythm: Normal rate and regular rhythm.  Pulmonary:     Effort: Pulmonary effort is normal. No respiratory distress.     Breath sounds: No wheezing.  Abdominal:     General: There is no distension.     Palpations: Abdomen is soft.  Musculoskeletal:        General: Normal range of motion.     Cervical back: Normal range of motion and neck supple.  Skin:    General: Skin is warm and dry.     Findings: No erythema or rash.  Neurological:     General: No focal deficit present.     Mental Status: He is alert and oriented to person, place, and time.  Psychiatric:        Mood and Affect: Mood normal.        Behavior: Behavior normal.        Thought Content: Thought content normal.        Judgment: Judgment normal.      CBC:    BMET Recent Labs    10/11/22 0049 10/12/22 0222  NA 127* 128*  K 4.5 3.8  CL 96* 98  CO2 22 24  GLUCOSE 343* 233*  BUN 21* 16  CREATININE 1.14 0.99  CALCIUM 7.3* 7.1*     Liver Panel  No results for input(s): "PROT", "ALBUMIN", "AST", "ALT", "ALKPHOS", "BILITOT", "BILIDIR", "IBILI" in the last 72 hours.     Sedimentation Rate No results for input(s): "ESRSEDRATE" in the last 72 hours. C-Reactive Protein No results for input(s): "CRP" in the last 72 hours.  Micro Results: Recent Results (from the past 720 hour(s))  Culture, blood (Routine x 2)     Status: Abnormal (Preliminary result)   Collection Time: 10/08/22  5:15 PM   Specimen: BLOOD   Result Value Ref Range Status   Specimen Description BLOOD RIGHT FOOT  Final  Special Requests   Final    BOTTLES DRAWN AEROBIC AND ANAEROBIC Blood Culture results may not be optimal due to an inadequate volume of blood received in culture bottles   Culture  Setup Time   Final    GRAM POSITIVE COCCI IN BOTH AEROBIC AND ANAEROBIC BOTTLES CRITICAL VALUE NOTED.  VALUE IS CONSISTENT WITH PREVIOUSLY REPORTED AND CALLED VALUE.    Culture (A)  Final    STREPTOCOCCUS CONSTELLATUS CRITICAL RESULT CALLED TO, READ BACK BY AND VERIFIED WITH: PHARMD AUSTIN P. CULTURE REINCUBATED FOR BETTER GROWTH Performed at [facility], [address], [place], [place] [address]    Report Status PENDING  Incomplete  Culture, blood (Routine x 2)     Status: Abnormal   Collection Time: 10/08/22  5:19 PM   Specimen: BLOOD  Result Value Ref Range Status   Specimen Description BLOOD RIGHT ANTECUBITAL  Final   Special Requests   Final    BOTTLES DRAWN AEROBIC AND ANAEROBIC Blood Culture adequate volume   Culture  Setup Time   Final    IN BOTH AEROBIC AND ANAEROBIC BOTTLES GRAM POSITIVE COCCI Organism ID to follow CRITICAL RESULT CALLED TO, READ BACK BY AND VERIFIED WITH:  C/ PHARMD H. VON DOHLEN 10/09/22 1608 A. LAFRANCE Performed at [facility], [address], [place], [place] [address]    Culture STREPTOCOCCUS MITIS/ORALIS (A)  Final   Report Status 10/11/2022 FINAL  Final   Organism ID, Bacteria STREPTOCOCCUS MITIS/ORALIS  Final      Susceptibility   Streptococcus mitis/oralis - MIC*    TETRACYCLINE >=16 RESISTANT Resistant     VANCOMYCIN 0.5 SENSITIVE Sensitive     CLINDAMYCIN 0.5 INTERMEDIATE Intermediate     PENICILLIN Value in next row Sensitive      SENSITIVEMIC <=/ 0.06    * STREPTOCOCCUS MITIS/ORALIS  Blood Culture ID Panel (Reflexed)     Status: Abnormal   Collection Time: 10/08/22  5:19 PM  Result Value Ref Range Status   Enterococcus faecalis NOT DETECTED NOT DETECTED Final    Enterococcus Faecium NOT DETECTED NOT DETECTED Final   Listeria monocytogenes NOT DETECTED NOT DETECTED Final   Staphylococcus species NOT DETECTED NOT DETECTED Final   Staphylococcus aureus (BCID) NOT DETECTED NOT DETECTED Final   Staphylococcus epidermidis NOT DETECTED NOT DETECTED Final   Staphylococcus lugdunensis NOT DETECTED NOT DETECTED Final   Streptococcus species DETECTED (A) NOT DETECTED Final    Comment: Not Enterococcus species, Streptococcus agalactiae, Streptococcus pyogenes, or Streptococcus pneumoniae. CRITICAL RESULT CALLED TO, READ BACK BY AND VERIFIED WITH:  C/ PHARMD H. VON DOHLEN 10/09/22 1608 A. LAFRANCE    Streptococcus agalactiae NOT DETECTED NOT DETECTED Final   Streptococcus pneumoniae NOT DETECTED NOT DETECTED Final   Streptococcus pyogenes NOT DETECTED NOT DETECTED Final   A.calcoaceticus-baumannii NOT DETECTED NOT DETECTED Final   Bacteroides fragilis NOT DETECTED NOT DETECTED Final   Enterobacterales NOT DETECTED NOT DETECTED Final   Enterobacter cloacae complex NOT DETECTED NOT DETECTED Final   Escherichia coli NOT DETECTED NOT DETECTED Final   Klebsiella aerogenes NOT DETECTED NOT DETECTED Final   Klebsiella oxytoca NOT DETECTED NOT DETECTED Final   Klebsiella pneumoniae NOT DETECTED NOT DETECTED Final   Proteus species NOT DETECTED NOT DETECTED Final   Salmonella species NOT DETECTED NOT DETECTED Final   Serratia marcescens NOT DETECTED NOT DETECTED Final   Haemophilus influenzae NOT DETECTED NOT DETECTED Final   Neisseria meningitidis NOT DETECTED NOT DETECTED Final   Pseudomonas aeruginosa NOT  DETECTED NOT DETECTED Final   Stenotrophomonas maltophilia NOT DETECTED NOT DETECTED Final   Candida albicans NOT DETECTED NOT DETECTED Final   Candida auris NOT DETECTED NOT DETECTED Final   Candida glabrata NOT DETECTED NOT DETECTED Final   Candida krusei NOT DETECTED NOT DETECTED Final   Candida parapsilosis NOT DETECTED NOT DETECTED Final   Candida  tropicalis NOT DETECTED NOT DETECTED Final   Cryptococcus neoformans/gattii NOT DETECTED NOT DETECTED Final    Comment: Performed at [facility], [address] [place], [place] [address]  Surgical pcr screen     Status: Abnormal   Collection Time: 10/10/22 10:57 AM   Specimen: Nasal Mucosa; Nasal Swab  Result Value Ref Range Status   MRSA, PCR NEGATIVE NEGATIVE Final   Staphylococcus aureus POSITIVE (A) NEGATIVE Final    Comment: RESULT CALLED TO, READ BACK BY AND VERIFIED WITH: 10/10/22 AT 1228 TO RN Hendricks Limes, ADC (NOTE) The Xpert SA Assay (FDA approved for NASAL specimens in patients 69 years of age and older), is one component of a comprehensive surveillance program. It is not intended to diagnose infection nor to guide or monitor treatment. Performed at [facility], [address] [place], [place] [address]   Aerobic/Anaerobic Culture w Gram Stain (surgical/deep wound)     Status: None (Preliminary result)   Collection Time: 10/10/22 11:33 AM   Specimen: Leg, Right; Tissue  Result Value Ref Range Status   Specimen Description TISSUE ABSCESS  Final   Special Requests RIGHT CALF, FLAGYL  Final   Gram Stain ABUNDANT WBC SEEN ABUNDANT GRAM POSITIVE COCCI   Final   Culture   Final    CULTURE REINCUBATED FOR BETTER GROWTH Performed at [facility], [address] [place], [place] [address]    Report Status PENDING  Incomplete  Culture, blood (Routine X 2) w Reflex to ID Panel     Status: None (Preliminary result)   Collection Time: 10/11/22 10:31 AM   Specimen: BLOOD RIGHT HAND  Result Value Ref Range Status   Specimen Description BLOOD RIGHT HAND  Final   Special Requests   Final    BOTTLES DRAWN AEROBIC AND ANAEROBIC Blood Culture adequate volume   Culture   Final    NO GROWTH < 24 HOURS Performed at [facility], [address] [place], [place] [address]    Report Status PENDING  Incomplete  Culture, blood (Routine X 2) w Reflex to ID  Panel     Status: None (Preliminary result)   Collection Time: 10/11/22 10:41 AM   Specimen: BLOOD RIGHT HAND  Result Value Ref Range Status   Specimen Description BLOOD RIGHT HAND  Final   Special Requests   Final    BOTTLES DRAWN AEROBIC ONLY Blood Culture results may not be optimal due to an inadequate volume of blood received in culture bottles   Culture   Final    NO GROWTH < 24 HOURS Performed at [facility], [address], [place], [place] [address]    Report Status PENDING  Incomplete    Studies/Results: ECHOCARDIOGRAM COMPLETE  Result Date: 10/11/2022    ECHOCARDIOGRAM REPORT   Patient Name:   MANSUR SANDUSKY Date of Exam: 10/11/2022 Medical Rec #:  [id number]  Height:       69.0 in Accession #:    [id number] Weight:       214.9 lb Date of Birth:  11/20/1975 BSA:  2.130 m Patient Age:    46 years   BP:           124/64 mmHg Patient Gender: M          HR:           84 bpm. Exam Location:  Inpatient Procedure: 2D Echo, Cardiac Doppler and Color Doppler Indications:    Bacteremia  History:        Patient has no prior history of Echocardiogram examinations.                 Stroke; Risk Factors:Diabetes.  Sonographer:    Dondra Prader RVT RCS Referring Phys: [id number] Add [place] N [place] IMPRESSIONS  1. Left ventricular ejection fraction, by estimation, is 50 to 55%. The left ventricle has low normal function. The left ventricle has no regional wall motion abnormalities. There is mild concentric left ventricular hypertrophy. Left ventricular diastolic parameters were normal.  2. Right ventricular systolic function is normal. The right ventricular size is normal.  3. The mitral valve is normal in structure. Mild mitral valve regurgitation. No evidence of mitral stenosis.  4. The aortic valve is normal in structure. Aortic valve regurgitation is not visualized. No aortic stenosis is present.  5. The inferior vena cava is normal in size with greater than 50% respiratory variability, suggesting  right atrial pressure of 3 mmHg. Conclusion(s)/Recommendation(s): Limited image quality. No obvious vegetation. Consider TEE if clinical suspicion for endocarditis is high. FINDINGS  Left Ventricle: Left ventricular ejection fraction, by estimation, is 50 to 55%. The left ventricle has low normal function. The left ventricle has no regional wall motion abnormalities. The left ventricular internal cavity size was normal in size. There is mild concentric left ventricular hypertrophy. Left ventricular diastolic parameters were normal. Right Ventricle: The right ventricular size is normal. No increase in right ventricular wall thickness. Right ventricular systolic function is normal. Left Atrium: Left atrial size was normal in size. Right Atrium: Right atrial size was normal in size. Pericardium: There is no evidence of pericardial effusion. Mitral Valve: The mitral valve is normal in structure. Mild mitral valve regurgitation. No evidence of mitral valve stenosis. Tricuspid Valve: The tricuspid valve is not well visualized. Tricuspid valve regurgitation is not demonstrated. No evidence of tricuspid stenosis. Aortic Valve: The aortic valve is normal in structure. Aortic valve regurgitation is not visualized. No aortic stenosis is present. Aortic valve mean gradient measures 1.0 mmHg. Aortic valve peak gradient measures 2.6 mmHg. Aortic valve area, by VTI measures 3.02 cm. Pulmonic Valve: The pulmonic valve was not well visualized. Pulmonic valve regurgitation is not visualized. No evidence of pulmonic stenosis. Aorta: The aortic root is normal in size and structure. Venous: The inferior vena cava is normal in size with greater than 50% respiratory variability, suggesting right atrial pressure of 3 mmHg. IAS/Shunts: No atrial level shunt detected by color flow Doppler.  LEFT VENTRICLE PLAX 2D LVIDd:         4.90 cm   Diastology LVIDs:         3.90 cm   LV e' medial:    6.09 cm/s LV PW:         1.20 cm   LV E/e' medial:   12.4 LV IVS:        1.10 cm   LV e' lateral:   6.81 cm/s LVOT diam:     2.10 cm   LV E/e' lateral: 11.1 LV SV:         42  LV SV Index:   20 LVOT Area:     3.46 cm  RIGHT VENTRICLE            IVC RV Basal diam:  3.50 cm    IVC diam: 1.60 cm RV Mid diam:    3.00 cm RV S prime:     9.00 cm/s TAPSE (M-mode): 2.1 cm LEFT ATRIUM             Index        RIGHT ATRIUM           Index LA diam:        3.70 cm 1.74 cm/m   RA Area:     14.50 cm LA Vol (A2C):   56.7 ml 26.62 ml/m  RA Volume:   39.70 ml  18.64 ml/m LA Vol (A4C):   53.9 ml 25.30 ml/m LA Biplane Vol: 58.2 ml 27.32 ml/m  AORTIC VALVE                    PULMONIC VALVE AV Area (Vmax):    3.04 cm     PV Vmax:       0.74 m/s AV Area (Vmean):   3.07 cm     PV Peak grad:  2.2 mmHg AV Area (VTI):     3.02 cm AV Vmax:           81.25 cm/s AV Vmean:          52.550 cm/s AV VTI:            0.139 m AV Peak Grad:      2.6 mmHg AV Mean Grad:      1.0 mmHg LVOT Vmax:         71.30 cm/s LVOT Vmean:        46.600 cm/s LVOT VTI:          0.121 m LVOT/AV VTI ratio: 0.87  AORTA Ao Root diam: 3.40 cm Ao Asc diam:  3.10 cm Ao Arch diam: 2.9 cm MITRAL VALVE MV Area (PHT): 3.85 cm    SHUNTS MV Decel Time: 197 msec    Systemic VTI:  0.12 m MV E velocity: 75.70 cm/s  Systemic Diam: 2.10 cm MV A velocity: 53.50 cm/s MV E/A ratio:  1.41 Arvilla Meres MD Electronically signed by Arvilla Meres MD Signature Date/Time: 10/11/2022/5:11:59 PM    Final       Assessment/Plan:  INTERVAL HISTORY: patient to go back to OR tomorrow   Principal Problem:   Diabetic foot ulcer (HCC) Active Problems:   Diabetes (HCC)   Severe sepsis (HCC)   Hyponatremia   Lactic acidosis   AKI (acute kidney injury) (HCC)   Gastroparesis   Elevated troponin   H/O: CVA (cerebrovascular accident)   Gangrene of right foot (HCC)   Cellulitis of right lower extremity   Necrotizing fasciitis of lower leg (HCC)    Carlos [name] is a 47 y.o. 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  Follow-up sensitivities.  Repeat blood cultures taken 2D echocardiogram unremarkable  Will ask cardiology for transesophageal echocardiogram.  Source control = leg   #2  Gangrenous foot with necrotizing fasciitis: Status  post transtibial amputation going back to the operating room Wednesday and potentially Friday.  I have personally spent 50 minutes involved in face-to-face and non-face-to-face activities for this patient on the day of the visit. Professional time spent includes the following activities: Preparing to see the patient (review of tests), Obtaining and/or reviewing separately obtained history (admission/discharge record), Performing a medically appropriate examination and/or evaluation , Ordering medications/tests/procedures, referring and communicating with other health care professionals, Documenting clinical information in the EMR, Independently interpreting results (not separately reported), Communicating results to the patient/family/caregiver, Counseling and educating the patient/family/caregiver and Care coordination (not separately reported).     LOS: 4 days   Carlos [name] 10/12/2022, 10:40 AM

## 2022-10-12 NOTE — Progress Notes (Signed)
PROGRESS NOTE  Carlos [name]  DOB: January 09, 1976  PCP: System, Provider Not In XBJ:[id number]  DOA: 10/08/2022  LOS: 4 days  Hospital Day: 5  Brief narrative: Carlos [name] is a 47 y.o. male with PMH significant for untreated DM2, gastroparesis, CVA, MRSA cellulitis 2021 5/3, patient presented to the ED with complaint of progressive swelling and blackening of right big toe for 3 weeks. 2 weeks ago, hewas seen at urgent care center and was prescribed a course of doxycycline which hecould not tolerate because of nausea and vomiting. His wound continued to grow. He also started having fever, chills, and hence came to the ED.  In the ED, patient had a fever of 102.6, heart rate 126, blood pressure in 90s, breathing on room air She was noted to have edematous right great toe with weeping, foul order and eschar Initial labs with WBC count 27.7, lactic acid 3.3>7.4>2.5 Blood culture was collected CT right foot with contrast showed extensive subcutaneous and intramuscular gas within the intrinsic musculature of the right foot as well as the terminal visualized right lower extremity musculature, findings suggestive of necrotizing fasciitis.  Acute osteomyelitis of first metatarsal head.  Patient was given IV fluid bolus, broad-spectrum IV antibiotics Admitted to [facility] was consulted. 5/5, patient underwent right BKA by Dr. Lajoyce Corners Possible for details  Subjective: Patient was seen and examined this morning. Lying on bed.  He is concerned about gastroparesis flare up caused by oral medicines he is getting. I had a long conversation with him with his daughter and sister on the phone regarding the need of diabetes control. I am afraid he is still not convinced about long-term need of insulin  Assessment and plan: Severe sepsis - POA Necrotizing fasciitis of right lower extremity Streptococcal bacteremia Presented with worsening right foot wound. Imaging as above showing gas gangrene as well as  necrotizing fasciitis 5/5, underwent right BKA by Dr. Lajoyce Corners.   Blood culture sent on admission as well as wound culture grew Streptococcus.  TTE did not show any vegetation.  Pending TEE. Noted a plan to return to OR on Wednesday to continue limb salvage intervention. WBC count improving.  Lactate level improved. Recent Labs  Lab 10/08/22 1719 10/08/22 1945 10/09/22 0118 10/09/22 0430 10/09/22 0803 10/10/22 0150 10/11/22 0049 10/12/22 0222  WBC 27.7*  --   --   --  27.7* 28.7* 25.3* 20.2*  LATICACIDVEN 3.3* 7.4* 2.5* 2.7* 3.1* 1.8  --   --    AKI Lactic acidosis Due to tissue hypoperfusion and renal hypoperfusion. Creatinine was normal in March 2023.  Presented with creatinine elevated 2.05.  Gradually down trended to normal with IV hydration. Recent Labs    10/08/22 1719 10/08/22 2109 10/09/22 0430 10/09/22 0803 10/10/22 0150 10/11/22 0049 10/12/22 0222  BUN 19 23* 23* 19 16 21* 16  CREATININE 2.05* 1.88* 1.57* 1.43* 1.28* 1.14 0.99    Acute hyponatremia baseline sodium level was normal.  Presented with low level of 123.   Serum osmolality is normal but urine osmolality is elevated.   Sodium level fluctuating, at 128 today.  Continue to monitor Recent Labs  Lab 10/08/22 1719 10/08/22 2109 10/09/22 0430 10/09/22 0803 10/10/22 0150 10/11/22 0049 10/12/22 0222  NA 123* 123* 125* 129* 126* 127* 128*   Elevated troponin Demand ischemia likely secondary to sepsis/infection.  No active anginal symptoms. However, given his significant cardiac risk factors, he may have underlying CAD as well. Follow-up with cardiology as an outpatient for risk stratification.  Type  2 diabetes mellitus uncontrolled with hyperglycemia A1c 10.4 on 10/08/2022.  Presented with a blood sugar level elevated over 500 PTA not on any meds. Currently on Semglee 30 units nightly.  Continue moderate SSI/Accu-Cheks Recent Labs  Lab 10/11/22 1618 10/11/22 2123 10/12/22 0611 10/12/22 0741  10/12/22 1133  GLUCAP 267* 210* 197* 203* 166*   Diabetic gastroparesis PTA not on any meds.   Patient is complaining of nausea with anything he eats.  I offered him IV Reglan but he states it did not work for him in the past and he does not want to try it again.   H/o CVA Not on any meds    Mobility: PT eval postprocedure  Goals of care   Code Status: Full Code    DVT prophylaxis:  SCD's Start: 10/10/22 1342 heparin injection 5,000 Units Start: 10/09/22 2200   Antimicrobials: IV ceftriaxone, IV Flagyl Fluid: Normal saline at 125 mill per hour.  Reduce the rate to 75 mill per hour. Consultants: Orthopedics Family Communication: None at bedside  Status: Inpatient Level of care:  Telemetry Medical   Patient from: Home Anticipated d/c to: Pending clinical course Needs to continue in-hospital care:  Needs surgical intervention    Diet:  Diet Order             Diet Carb Modified Fluid consistency: Thin; Room service appropriate? Yes  Diet effective now                   Scheduled Meds:  (feeding supplement) PROSource Plus  30 mL Oral TID BM   vitamin C  1,000 mg Oral Daily   docusate sodium  100 mg Oral Daily   heparin  5,000 Units Subcutaneous Q8H   insulin aspart  0-15 Units Subcutaneous TID WC   insulin aspart  0-5 Units Subcutaneous QHS   insulin glargine-yfgn  30 Units Subcutaneous QHS   insulin starter kit- pen needles  1 kit Other Once   living well with diabetes book   Does not apply Once   nutrition supplement (JUVEN)  1 packet Oral BID BM   pantoprazole  40 mg Oral Daily   zinc sulfate  220 mg Oral Daily    PRN meds: acetaminophen, alum & mag hydroxide-simeth, bisacodyl, guaiFENesin-dextromethorphan, hydrALAZINE, HYDROmorphone (DILAUDID) injection, labetalol, magnesium citrate, magnesium sulfate bolus IVPB, metoprolol tartrate, morphine injection, ondansetron, oxyCODONE, oxyCODONE, phenol, polyethylene glycol, potassium chloride, senna-docusate    Infusions:   sodium chloride Stopped (10/10/22 0906)   sodium chloride 75 mL/hr at 10/11/22 0630   cefTRIAXone (ROCEPHIN)  IV 2 g (10/11/22 1705)   magnesium sulfate bolus IVPB     metronidazole 500 mg (10/12/22 0957)    Antimicrobials: Anti-infectives (From admission, onward)    Start     Dose/Rate Route Frequency Ordered Stop   10/11/22 2100  metroNIDAZOLE (FLAGYL) tablet 500 mg  Status:  Discontinued        500 mg Oral Every 12 hours 10/11/22 1121 10/11/22 1856   10/11/22 2000  metroNIDAZOLE (FLAGYL) IVPB 500 mg        500 mg 100 mL/hr over 60 Minutes Intravenous 2 times daily 10/11/22 1856     10/11/22 1600  cefTRIAXone (ROCEPHIN) 2 g in sodium chloride 0.9 % 100 mL IVPB        2 g 200 mL/hr over 30 Minutes Intravenous Every 24 hours 10/11/22 0958     10/10/22 1345  ceFAZolin (ANCEF) IVPB 2g/100 mL premix  Status:  Discontinued  2 g 200 mL/hr over 30 Minutes Intravenous Every 8 hours 10/10/22 1341 10/10/22 1528   10/10/22 1045  ceFAZolin (ANCEF) IVPB 2g/100 mL premix  Status:  Discontinued        2 g 200 mL/hr over 30 Minutes Intravenous On call to O.R. 10/10/22 0947 10/10/22 1013   10/10/22 0955  metroNIDAZOLE (FLAGYL) 500 MG/100ML IVPB       Note to Pharmacy: Launa Flight M: cabinet override      10/10/22 0955 10/10/22 1115   10/09/22 2100  vancomycin (VANCOREADY) IVPB 1250 mg/250 mL  Status:  Discontinued        1,250 mg 166.7 mL/hr over 90 Minutes Intravenous Every 24 hours 10/09/22 1604 10/11/22 0957   10/09/22 1800  ceFEPIme (MAXIPIME) 2 g in sodium chloride 0.9 % 100 mL IVPB  Status:  Discontinued        2 g 200 mL/hr over 30 Minutes Intravenous Every 8 hours 10/09/22 1616 10/11/22 0957   10/09/22 0600  ceFEPIme (MAXIPIME) 2 g in sodium chloride 0.9 % 100 mL IVPB  Status:  Discontinued        2 g 200 mL/hr over 30 Minutes Intravenous Every 12 hours 10/08/22 2103 10/09/22 1616   10/08/22 2103  vancomycin variable dose per unstable renal function (pharmacist  dosing)  Status:  Discontinued         Does not apply See admin instructions 10/08/22 2103 10/09/22 1604   10/08/22 2015  vancomycin (VANCOREADY) IVPB 2000 mg/400 mL        2,000 mg 200 mL/hr over 120 Minutes Intravenous  Once 10/08/22 2005 10/08/22 2250   10/08/22 1800  ceFEPIme (MAXIPIME) 2 g in sodium chloride 0.9 % 100 mL IVPB        2 g 200 mL/hr over 30 Minutes Intravenous  Once 10/08/22 1748 10/08/22 1916   10/08/22 1745  metroNIDAZOLE (FLAGYL) IVPB 500 mg  Status:  Discontinued        500 mg 100 mL/hr over 60 Minutes Intravenous Every 12 hours 10/08/22 1736 10/11/22 1121       Nutritional status:  Body mass index is 31.74 kg/m.  Nutrition Problem: Increased nutrient needs Etiology: wound healing, post-op healing Signs/Symptoms: estimated needs     Objective: Vitals:   10/12/22 0436 10/12/22 0746  BP: 122/77 112/72  Pulse: 88 77  Resp: 16 17  Temp: 97.8 F (36.6 C) 97.9 F (36.6 C)  SpO2: 99% 99%    Intake/Output Summary (Last 24 hours) at 10/12/2022 1300 Last data filed at 10/11/2022 2003 Gross per 24 hour  Intake 240 ml  Output 0 ml  Net 240 ml   Filed Weights   10/08/22 1708 10/08/22 1947  Weight: 97.5 kg 97.5 kg   Weight change:  Body mass index is 31.74 kg/m.   Physical Exam: General exam: Middle-aged African-American male. Looks older for his stated age Skin: No rashes, lesions or ulcers. HEENT: Atraumatic, normocephalic, no obvious bleeding Lungs: Clear to auscultation bilaterally CVS: Regular rate and rhythm, no murmur GI/Abd soft, nontender, nondistended, bowel sound present CNS: Alert, awake, oriented x 3 Psychiatry: Mood appropriate Extremities: Right leg new BKA status  Data Review: I have personally reviewed the laboratory data and studies available.  F/u labs ordered Unresulted Labs (From admission, onward)     Start     Ordered   10/13/22 0500  Basic metabolic panel  Tomorrow morning,   R       Question:  Specimen collection  method  Answer:  Lab=Lab collect   10/12/22 1300   10/11/22 0500  CBC  Daily,   R     Question:  Specimen collection method  Answer:  Lab=Lab collect   10/10/22 1341            Total time spent in review of labs and imaging, patient evaluation, formulation of plan, documentation and communication with family: 45 minutes  Signed, Lorin Glass, MD [facility] 10/12/2022

## 2022-10-12 NOTE — Progress Notes (Signed)
Inpatient Rehab Admissions Coordinator:    Pt. Returning to the OR tomorrow. Not ready for CIR. I will follow for potential admit pending medical readiness.  Megan Salon, MS, CCC-SLP Rehab Admissions Coordinator  [phone] (celll) [phone] (office)

## 2022-10-12 NOTE — Progress Notes (Signed)
   10/12/22 1344  Spiritual Encounters  Type of Visit Initial  Care provided to: Patient  Referral source Nurse (RN/NT/LPN)  Reason for visit Urgent spiritual support  OnCall Visit No  Spiritual Framework  Presenting Themes Impactful experiences and emotions  Patient Stress Factors Major life changes  Family Stress Factors None identified  Spiritual Care Plan  Spiritual Care Issues Still Outstanding No further spiritual care needs at this time (see row info)   Patient was surprised to receive a visit from a chaplain.  He repeatedly said he was ok. The patient seemed distant and not interested in engaging with the chaplain.    I assured him that a chaplain was available to speak with him if he wanted.  Chaplain Stacyann Mcconaughy Lile-King

## 2022-10-12 NOTE — Plan of Care (Signed)

## 2022-10-12 NOTE — H&P (View-Only) (Signed)
Patient ID: Carlos [name], male   DOB: 12/16/1975, 46 y.o.   MRN: [id number] Patient is seen in follow-up status post transtibial amputation for necrotizing fasciitis right lower extremity.  Patient's wound VAC has 200 cc which was 125 yesterday.  The neutrophil lymphocyte absolute count is improving currently 15.9-2.2.  White cell count has decreased to 20.  Hemoglobin stable at 8.8.  Cultures are showing strep.  Will plan for return to the operating room tomorrow for further debridement of the transtibial amputation with the goal to maintain the transtibial amputation level.  Possible repeat debridement on Friday. 

## 2022-10-12 NOTE — Progress Notes (Signed)
Mobility Specialist Progress Note   10/12/22 1110  Mobility  Activity Refused mobility   Patient declined to participate for unspecified reasons. Stated he walked already and doesn't need rehab right now, asked to return later. Deferred PT earlier this morning. Will follow up as time permits.  [place] Corydon Schweiss, BS EXP Mobility Specialist Please contact via SecureChat or Rehab office at [phone]

## 2022-10-12 NOTE — Progress Notes (Signed)
Informed by nightshift nurse pt refused Heparin this morning, this nurse educated pt on the importance of heparin. Pt agreed to take it. Pt refused to take any oral am meds stating he doesn't take oral meds because it hurts his stomach causing nausea/vomiting. MD notified ordered Reglan IV, Pt refused. Pt also refused rocephin IV.

## 2022-10-13 ENCOUNTER — Encounter (HOSPITAL_COMMUNITY)
Admission: EM | Disposition: A | Discharge status: Rehabilitation Facility | Payer: Self-pay | Source: Home / Self Care | Attending: Internal Medicine

## 2022-10-13 ENCOUNTER — Inpatient Hospital Stay (HOSPITAL_COMMUNITY): Payer: Medicaid Other | Admitting: Anesthesiology

## 2022-10-13 ENCOUNTER — Encounter (HOSPITAL_COMMUNITY): Payer: Self-pay | Admitting: Family Medicine

## 2022-10-13 ENCOUNTER — Ambulatory Visit (HOSPITAL_BASED_OUTPATIENT_CLINIC_OR_DEPARTMENT_OTHER): Payer: 59 | Admitting: General Surgery

## 2022-10-13 ENCOUNTER — Other Ambulatory Visit: Payer: Self-pay

## 2022-10-13 DIAGNOSIS — M726 Necrotizing fasciitis: Secondary | ICD-10-CM

## 2022-10-13 DIAGNOSIS — E119 Type 2 diabetes mellitus without complications: Secondary | ICD-10-CM

## 2022-10-13 HISTORY — PX: STUMP REVISION: SHX6102

## 2022-10-13 LAB — TYPE AND SCREEN

## 2022-10-13 LAB — PREPARE RBC (CROSSMATCH)

## 2022-10-13 LAB — BPAM RBC

## 2022-10-13 LAB — GLUCOSE, CAPILLARY
Glucose-Capillary: 129 mg/dL — ABNORMAL HIGH (ref 70–99)
Glucose-Capillary: 129 mg/dL — ABNORMAL HIGH (ref 70–99)
Glucose-Capillary: 120 mg/dL — ABNORMAL HIGH (ref 70–99)
Glucose-Capillary: 157 mg/dL — ABNORMAL HIGH (ref 70–99)
Glucose-Capillary: 112 mg/dL — ABNORMAL HIGH (ref 70–99)

## 2022-10-13 LAB — BASIC METABOLIC PANEL
Anion gap: 9 (ref 5–15)
BUN: 10 mg/dL (ref 6–20)
CO2: 24 mmol/L (ref 22–32)
Calcium: 7.4 mg/dL — ABNORMAL LOW (ref 8.9–10.3)
Chloride: 99 mmol/L (ref 98–111)
Creatinine, Ser: 0.79 mg/dL (ref 0.61–1.24)
GFR, Estimated: >60 mL/min (ref >60)
Glucose, Bld: 160 mg/dL — ABNORMAL HIGH (ref 70–99)
Potassium: 4 mmol/L (ref 3.5–5.1)
Sodium: 132 mmol/L — ABNORMAL LOW (ref 135–145)

## 2022-10-13 LAB — PREALBUMIN: Prealbumin: 9 mg/dL — ABNORMAL LOW (ref 18–38)

## 2022-10-13 LAB — CULTURE, BLOOD (ROUTINE X 2): Special Requests: ADEQUATE

## 2022-10-13 LAB — ABO/RH: ABO/RH(D): A POS

## 2022-10-13 LAB — MAGNESIUM: Magnesium: 1.9 mg/dL (ref 1.7–2.4)

## 2022-10-13 LAB — AEROBIC/ANAEROBIC CULTURE W GRAM STAIN (SURGICAL/DEEP WOUND)

## 2022-10-13 SURGERY — REVISION, AMPUTATION SITE
Anesthesia: Regional | Laterality: Right

## 2022-10-13 MED ORDER — 0.9 % SODIUM CHLORIDE (POUR BTL) OPTIME
TOPICAL | Status: DC | PRN
  Administered 2022-10-13: 1000 mL

## 2022-10-13 MED ORDER — ACETAMINOPHEN 500 MG PO TABS: 1000.0000 mg | ORAL_TABLET | Freq: Once | ORAL | Status: DC

## 2022-10-13 MED ORDER — PHENOL 1.4 % MT LIQD: 1.0000 | OROMUCOSAL | Status: DC | PRN

## 2022-10-13 MED ORDER — GUAIFENESIN-DM 100-10 MG/5ML PO SYRP: 15.0000 mL | ORAL_SOLUTION | ORAL | Status: DC | PRN

## 2022-10-13 MED ORDER — SODIUM CHLORIDE 0.9 % IV SOLN: INTRAVENOUS | Status: DC

## 2022-10-13 MED ORDER — BUPIVACAINE LIPOSOME 1.3 % IJ SUSP
INTRAMUSCULAR | Status: DC | PRN
  Administered 2022-10-13: 10 mL via PERINEURAL

## 2022-10-13 MED ORDER — AMISULPRIDE (ANTIEMETIC) 5 MG/2ML IV SOLN: 10.0000 mg | Freq: Once | INTRAVENOUS | Status: DC | PRN

## 2022-10-13 MED ORDER — LABETALOL HCL 5 MG/ML IV SOLN: 10.0000 mg | INTRAVENOUS | Status: DC | PRN

## 2022-10-13 MED ORDER — ALUM & MAG HYDROXIDE-SIMETH 200-200-20 MG/5ML PO SUSP: 15.0000 mL | ORAL | Status: DC | PRN

## 2022-10-13 MED ORDER — METOPROLOL TARTRATE 5 MG/5ML IV SOLN: 2.0000 mg | INTRAVENOUS | Status: DC | PRN

## 2022-10-13 MED ORDER — CEFAZOLIN SODIUM-DEXTROSE 2-4 GM/100ML-% IV SOLN
2.0000 g | INTRAVENOUS | Status: AC
  Administered 2022-10-13: 2 g via INTRAVENOUS
  Filled 2022-10-13: qty 100

## 2022-10-13 MED ORDER — BUPIVACAINE LIPOSOME 1.3 % IJ SUSP
INTRAMUSCULAR | Status: AC
  Filled 2022-10-13: qty 10

## 2022-10-13 MED ORDER — BUPIVACAINE HCL (PF) 0.5 % IJ SOLN
INTRAMUSCULAR | Status: DC | PRN
  Administered 2022-10-13: 15 mL via PERINEURAL

## 2022-10-13 MED ORDER — ROPIVACAINE HCL 5 MG/ML IJ SOLN
INTRAMUSCULAR | Status: DC | PRN
  Administered 2022-10-13: 15 mL via PERINEURAL

## 2022-10-13 MED ORDER — MAGNESIUM SULFATE 2 GM/50ML IV SOLN: 2.0000 g | Freq: Every day | INTRAVENOUS | Status: DC | PRN

## 2022-10-13 MED ORDER — FENTANYL CITRATE (PF) 100 MCG/2ML IJ SOLN: 25.0000 ug | INTRAMUSCULAR | Status: DC | PRN

## 2022-10-13 MED ORDER — MIDAZOLAM HCL 2 MG/2ML IJ SOLN: 2.0000 mg | Freq: Once | INTRAMUSCULAR | Status: AC

## 2022-10-13 MED ORDER — POTASSIUM CHLORIDE CRYS ER 20 MEQ PO TBCR: 20.0000 meq | EXTENDED_RELEASE_TABLET | Freq: Every day | ORAL | Status: DC | PRN

## 2022-10-13 MED ORDER — VITAMIN C 500 MG PO TABS: 1000.0000 mg | ORAL_TABLET | Freq: Every day | ORAL | Status: DC

## 2022-10-13 MED ORDER — ONDANSETRON HCL 4 MG/2ML IJ SOLN: 4.0000 mg | Freq: Four times a day (QID) | INTRAMUSCULAR | Status: DC | PRN

## 2022-10-13 MED ORDER — ORAL CARE MOUTH RINSE: 15.0000 mL | Freq: Once | OROMUCOSAL | Status: DC

## 2022-10-13 MED ORDER — ACETAMINOPHEN 325 MG PO TABS: 325.0000 mg | ORAL_TABLET | ORAL | Status: DC | PRN

## 2022-10-13 MED ORDER — ACETAMINOPHEN 10 MG/ML IV SOLN: 1000.0000 mg | Freq: Once | INTRAVENOUS | Status: DC | PRN

## 2022-10-13 MED ORDER — TRANEXAMIC ACID-NACL 1000-0.7 MG/100ML-% IV SOLN
1000.0000 mg | INTRAVENOUS | Status: AC
  Administered 2022-10-13: 1000 mg via INTRAVENOUS
  Filled 2022-10-13: qty 100

## 2022-10-13 MED ORDER — OXYCODONE HCL 5 MG PO TABS: 5.0000 mg | ORAL_TABLET | Freq: Once | ORAL | Status: DC | PRN

## 2022-10-13 MED ORDER — ACETAMINOPHEN 160 MG/5ML PO SOLN: 325.0000 mg | ORAL | Status: DC | PRN

## 2022-10-13 MED ORDER — FENTANYL CITRATE (PF) 100 MCG/2ML IJ SOLN: 50.0000 ug | Freq: Once | INTRAMUSCULAR | Status: AC

## 2022-10-13 MED ORDER — JUVEN PO PACK: 1.0000 | PACK | Freq: Two times a day (BID) | ORAL | Status: DC

## 2022-10-13 MED ORDER — POVIDONE-IODINE 10 % EX SWAB
2.0000 | Freq: Once | CUTANEOUS | Status: AC
  Administered 2022-10-13: 2 via TOPICAL

## 2022-10-13 MED ORDER — ACETAMINOPHEN 500 MG PO TABS
ORAL_TABLET | ORAL | Status: AC
  Filled 2022-10-13: qty 2

## 2022-10-13 MED ORDER — SODIUM CHLORIDE 0.9% IV SOLUTION: Freq: Once | INTRAVENOUS | Status: DC

## 2022-10-13 MED ORDER — POLYETHYLENE GLYCOL 3350 17 G PO PACK: 17.0000 g | PACK | Freq: Every day | ORAL | Status: DC | PRN

## 2022-10-13 MED ORDER — PROPOFOL 10 MG/ML IV BOLUS
INTRAVENOUS | Status: DC | PRN
  Administered 2022-10-13 (×2): 10 mg via INTRAVENOUS
  Administered 2022-10-13: 20 mg via INTRAVENOUS
  Administered 2022-10-13 (×2): 10 mg via INTRAVENOUS
  Administered 2022-10-13 (×4): 20 mg via INTRAVENOUS

## 2022-10-13 MED ORDER — CHLORHEXIDINE GLUCONATE 0.12 % MT SOLN: 15.0000 mL | Freq: Once | OROMUCOSAL | Status: DC

## 2022-10-13 MED ORDER — INSULIN ASPART 100 UNIT/ML IJ SOLN: 0.0000 [IU] | INTRAMUSCULAR | Status: DC | PRN

## 2022-10-13 MED ORDER — LACTATED RINGERS IV SOLN: INTRAVENOUS | Status: DC

## 2022-10-13 MED ORDER — DOCUSATE SODIUM 100 MG PO CAPS: 100.0000 mg | ORAL_CAPSULE | Freq: Every day | ORAL | Status: DC

## 2022-10-13 MED ORDER — CHLORHEXIDINE GLUCONATE 4 % EX SOLN
60.0000 mL | Freq: Once | CUTANEOUS | Status: DC
  Filled 2022-10-13: qty 60

## 2022-10-13 MED ORDER — LIDOCAINE HCL (CARDIAC) PF 100 MG/5ML IV SOSY
PREFILLED_SYRINGE | INTRAVENOUS | Status: DC | PRN
  Administered 2022-10-13: 20 mg via INTRAVENOUS

## 2022-10-13 MED ORDER — CHLORHEXIDINE GLUCONATE 0.12 % MT SOLN
OROMUCOSAL | Status: AC
  Filled 2022-10-13: qty 15

## 2022-10-13 MED ORDER — PROMETHAZINE HCL 25 MG/ML IJ SOLN: 6.2500 mg | INTRAMUSCULAR | Status: DC | PRN

## 2022-10-13 MED ORDER — FENTANYL CITRATE (PF) 100 MCG/2ML IJ SOLN
INTRAMUSCULAR | Status: AC
  Administered 2022-10-13: 50 ug via INTRAVENOUS
  Filled 2022-10-13: qty 2

## 2022-10-13 MED ORDER — BISACODYL 5 MG PO TBEC: 5.0000 mg | DELAYED_RELEASE_TABLET | Freq: Every day | ORAL | Status: DC | PRN

## 2022-10-13 MED ORDER — MAGNESIUM CITRATE PO SOLN: 1.0000 | Freq: Once | ORAL | Status: DC | PRN

## 2022-10-13 MED ORDER — PANTOPRAZOLE SODIUM 40 MG PO TBEC
40.0000 mg | DELAYED_RELEASE_TABLET | Freq: Every day | ORAL | Status: DC
  Filled 2022-10-13: qty 1

## 2022-10-13 MED ORDER — HYDRALAZINE HCL 20 MG/ML IJ SOLN: 5.0000 mg | INTRAMUSCULAR | Status: DC | PRN

## 2022-10-13 MED ORDER — OXYCODONE HCL 5 MG/5ML PO SOLN: 5.0000 mg | Freq: Once | ORAL | Status: DC | PRN

## 2022-10-13 MED ORDER — MIDAZOLAM HCL 2 MG/2ML IJ SOLN
INTRAMUSCULAR | Status: AC
  Administered 2022-10-13: 2 mg via INTRAVENOUS
  Filled 2022-10-13: qty 2

## 2022-10-13 MED ORDER — ZINC SULFATE 220 (50 ZN) MG PO CAPS: 220.0000 mg | ORAL_CAPSULE | Freq: Every day | ORAL | Status: DC

## 2022-10-13 SURGICAL SUPPLY — 37 items
BAG COUNTER SPONGE SURGICOUNT (BAG) ×2 IMPLANT
BAG SPNG CNTER NS LX DISP (BAG) ×1
BLADE SAW RECIP 87.9 MT (BLADE) IMPLANT
BLADE SURG 21 STRL SS (BLADE) ×2 IMPLANT
CANISTER WOUND CARE 500ML ATS (WOUND CARE) ×2 IMPLANT
COVER SURGICAL LIGHT HANDLE (MISCELLANEOUS) ×2 IMPLANT
DRAPE EXTREMITY T 121X128X90 (DISPOSABLE) ×2 IMPLANT
DRAPE HALF SHEET 40X57 (DRAPES) ×2 IMPLANT
DRAPE INCISE IOBAN 66X45 STRL (DRAPES) ×2 IMPLANT
DRAPE U-SHAPE 47X51 STRL (DRAPES) ×4 IMPLANT
DRESSING PREVENA PLUS CUSTOM (GAUZE/BANDAGES/DRESSINGS) ×2 IMPLANT
DRESSING VERAFLO CLEANS CC MED (GAUZE/BANDAGES/DRESSINGS) IMPLANT
DRESSING VERAFLO CLEANSE CC (GAUZE/BANDAGES/DRESSINGS) IMPLANT
DRSG PREVENA PLUS CUSTOM (GAUZE/BANDAGES/DRESSINGS) ×1
DRSG VERAFLO CLEANSE CC (GAUZE/BANDAGES/DRESSINGS) ×1
DRSG VERAFLO CLEANSE CC MED (GAUZE/BANDAGES/DRESSINGS) ×1
DURAPREP 26ML APPLICATOR (WOUND CARE) ×2 IMPLANT
ELECT REM PT RETURN 9FT ADLT (ELECTROSURGICAL) ×1
ELECTRODE REM PT RTRN 9FT ADLT (ELECTROSURGICAL) ×2 IMPLANT
GLOVE BIOGEL PI IND STRL 9 (GLOVE) ×2 IMPLANT
GLOVE SURG ORTHO 9.0 STRL STRW (GLOVE) ×2 IMPLANT
GOWN STRL REUS W/ TWL XL LVL3 (GOWN DISPOSABLE) ×4 IMPLANT
GOWN STRL REUS W/TWL XL LVL3 (GOWN DISPOSABLE) ×2
GRAFT SKIN WND MICRO 38 (Tissue) IMPLANT
KIT BASIN OR (CUSTOM PROCEDURE TRAY) ×2 IMPLANT
KIT TURNOVER KIT B (KITS) ×2 IMPLANT
MANIFOLD NEPTUNE II (INSTRUMENTS) ×2 IMPLANT
NS IRRIG 1000ML POUR BTL (IV SOLUTION) ×2 IMPLANT
PACK GENERAL/GYN (CUSTOM PROCEDURE TRAY) ×2 IMPLANT
PAD ARMBOARD 7.5X6 YLW CONV (MISCELLANEOUS) ×2 IMPLANT
PAD NEG PRESSURE SENSATRAC (MISCELLANEOUS) IMPLANT
PREVENA RESTOR ARTHOFORM 46X30 (CANNISTER) ×2 IMPLANT
STAPLER VISISTAT 35W (STAPLE) IMPLANT
SUT ETHILON 2 0 PSLX (SUTURE) ×4 IMPLANT
SUT SILK 2 0 (SUTURE)
SUT SILK 2-0 18XBRD TIE 12 (SUTURE) IMPLANT
TOWEL GREEN STERILE (TOWEL DISPOSABLE) ×2 IMPLANT

## 2022-10-13 NOTE — Progress Notes (Signed)
PROGRESS NOTE  Carlos [name]  DOB: 1975-11-02  PCP: System, Provider Not In ZOX:[id number]  DOA: 10/08/2022  LOS: 5 days  Hospital Day: 6  Brief narrative: Carlos [name] is a 47 y.o. male with PMH significant for untreated DM2, gastroparesis, CVA, MRSA cellulitis 2021 5/3, patient presented to the ED with complaint of progressive swelling and blackening of right big toe for 3 weeks. 2 weeks ago, hewas seen at urgent care center and was prescribed a course of doxycycline which hecould not tolerate because of nausea and vomiting. His wound continued to grow. He also started having fever, chills, and hence came to the ED.  In the ED, patient had a fever of 102.6, heart rate 126, blood pressure in 90s, breathing on room air She was noted to have edematous right great toe with weeping, foul order and eschar Initial labs with WBC count 27.7, lactic acid 3.3>7.4>2.5 Blood culture was collected CT right foot with contrast showed extensive subcutaneous and intramuscular gas within the intrinsic musculature of the right foot as well as the terminal visualized right lower extremity musculature, findings suggestive of necrotizing fasciitis.  Acute osteomyelitis of first metatarsal head.  Patient was given IV fluid bolus, broad-spectrum IV antibiotics Admitted to [facility] Orthopedics was consulted. 5/5, patient underwent right BKA by Dr. Lajoyce Corners Possible for details  Subjective: Patient was seen and examined this morning. Not in distress.  No new symptoms.  Waiting for repeat surgery today  Assessment and plan: Severe sepsis - POA Necrotizing fasciitis of right lower extremity Streptococcal bacteremia Presented with worsening right foot wound. Imaging as above showing gas gangrene as well as necrotizing fasciitis 5/5, underwent right BKA by Dr. Lajoyce Corners.   Blood culture sent on admission as well as wound culture grew Streptococcus.  TTE did not show any vegetation.  Pending TEE. Noted a plan to return to OR on  Wednesday to continue limb salvage intervention. WBC count improving.  Lactate level improved. Recent Labs  Lab 10/08/22 1719 10/08/22 1945 10/09/22 0118 10/09/22 0430 10/09/22 0803 10/10/22 0150 10/11/22 0049 10/12/22 0222  WBC 27.7*  --   --   --  27.7* 28.7* 25.3* 20.2*  LATICACIDVEN 3.3* 7.4* 2.5* 2.7* 3.1* 1.8  --   --     AKI Lactic acidosis Due to tissue hypoperfusion and renal hypoperfusion. Creatinine was normal in March 2023.  Presented with creatinine elevated 2.05.  Gradually down trended to normal with IV hydration. Recent Labs    10/08/22 1719 10/08/22 2109 10/09/22 0430 10/09/22 0803 10/10/22 0150 10/11/22 0049 10/12/22 0222 10/13/22 0202  BUN 19 23* 23* 19 16 21* 16 10  CREATININE 2.05* 1.88* 1.57* 1.43* 1.28* 1.14 0.99 0.79     Acute hyponatremia baseline sodium level was normal.  Presented with low level of 123.   Serum osmolality is normal but urine osmolality is elevated.   Sodium level fluctuating, at 132 today.  Continue to monitor Recent Labs  Lab 10/08/22 1719 10/08/22 2109 10/09/22 0430 10/09/22 0803 10/10/22 0150 10/11/22 0049 10/12/22 0222 10/13/22 0202  NA 123* 123* 125* 129* 126* 127* 128* 132*    Elevated troponin Demand ischemia likely secondary to sepsis/infection.  No active anginal symptoms. However, given his significant cardiac risk factors, he may have underlying CAD as well. Follow-up with cardiology as an outpatient for risk stratification.  Type 2 diabetes mellitus uncontrolled with hyperglycemia A1c 10.4 on 10/08/2022.  Presented with a blood sugar level elevated over 500 PTA not on any meds. Currently on Semglee 30  units nightly.  Continue moderate SSI/Accu-Cheks Recent Labs  Lab 10/12/22 1610 10/12/22 1921 10/13/22 0802 10/13/22 0959 10/13/22 1143  GLUCAP 161* 156* 129* 129* 120*    Diabetic gastroparesis PTA not on any meds.   Patient is complaining of nausea with anything he eats.  I offered him IV  Reglan but he states it did not work for him in the past and he does not want to try it again.   H/o CVA Not on any meds    Mobility: PT eval postprocedure  Goals of care   Code Status: Full Code    DVT prophylaxis:  SCD's Start: 10/10/22 1342 heparin injection 5,000 Units Start: 10/09/22 2200   Antimicrobials: IV ceftriaxone, IV Flagyl Fluid: NS at 75 mill per hour Consultants: Orthopedics Family Communication: None at bedside  Status: Inpatient Level of care:  Telemetry Medical   Patient from: Home Anticipated d/c to: Pending clinical course Needs to continue in-hospital care:  Needs surgical intervention    Diet:  Diet Order             Diet NPO time specified  Diet effective ____                   Scheduled Meds:  [MAR Hold] (feeding supplement) PROSource Plus  30 mL Oral TID BM   [MAR Hold] sodium chloride   Intravenous Once   acetaminophen  1,000 mg Oral Once   [MAR Hold] vitamin C  1,000 mg Oral Daily   chlorhexidine  60 mL Topical Once   chlorhexidine  15 mL Mouth/Throat Once   Or   mouth rinse  15 mL Mouth Rinse Once   [MAR Hold] Chlorhexidine Gluconate Cloth  6 each Topical Daily   [MAR Hold] docusate sodium  100 mg Oral Daily   [MAR Hold] heparin  5,000 Units Subcutaneous Q8H   [MAR Hold] insulin aspart  0-15 Units Subcutaneous TID WC   [MAR Hold] insulin aspart  0-5 Units Subcutaneous QHS   [MAR Hold] insulin glargine-yfgn  30 Units Subcutaneous QHS   [MAR Hold] insulin starter kit- pen needles  1 kit Other Once   [MAR Hold] living well with diabetes book   Does not apply Once   [MAR Hold] mupirocin ointment  1 Application Nasal BID   [MAR Hold] nutrition supplement (JUVEN)  1 packet Oral BID BM   [MAR Hold] pantoprazole  40 mg Oral Daily   tranexamic acid (CYKLOKAPRON) 2,000 mg in sodium chloride 0.9 % 50 mL Topical Application  2,000 mg Topical To OR   [MAR Hold] zinc sulfate  220 mg Oral Daily    PRN meds: acetaminophen,  acetaminophen **OR** acetaminophen (TYLENOL) oral liquid 160 mg/5 mL, [MAR Hold] acetaminophen, [MAR Hold] alum & mag hydroxide-simeth, amisulpride, [MAR Hold] bisacodyl, fentaNYL (SUBLIMAZE) injection, [MAR Hold] guaiFENesin-dextromethorphan, [MAR Hold] hydrALAZINE, [MAR Hold]  HYDROmorphone (DILAUDID) injection, insulin aspart, [MAR Hold] labetalol, [MAR Hold] magnesium citrate, [MAR Hold] magnesium sulfate bolus IVPB, [MAR Hold] metoprolol tartrate, [MAR Hold]  morphine injection, [MAR Hold] ondansetron, [MAR Hold] oxyCODONE, oxyCODONE **OR** oxyCODONE, [MAR Hold] oxyCODONE, [MAR Hold] phenol, [MAR Hold] polyethylene glycol, [MAR Hold] potassium chloride, promethazine, [MAR Hold] senna-docusate   Infusions:   sodium chloride 75 mL/hr at 10/12/22 1425   sodium chloride 75 mL/hr at 10/11/22 0630   acetaminophen     [MAR Hold] cefTRIAXone (ROCEPHIN)  IV 2 g (10/11/22 1705)   lactated ringers     [MAR Hold] magnesium sulfate bolus IVPB     [  MAR Hold] metronidazole 500 mg (10/13/22 0931)    Antimicrobials: Anti-infectives (From admission, onward)    Start     Dose/Rate Route Frequency Ordered Stop   10/13/22 0600  ceFAZolin (ANCEF) IVPB 2g/100 mL premix        2 g 200 mL/hr over 30 Minutes Intravenous On call to O.R. 10/13/22 0207 10/13/22 1124   10/11/22 2100  metroNIDAZOLE (FLAGYL) tablet 500 mg  Status:  Discontinued        500 mg Oral Every 12 hours 10/11/22 1121 10/11/22 1856   10/11/22 2000  [MAR Hold]  metroNIDAZOLE (FLAGYL) IVPB 500 mg        (MAR Hold since Wed 10/13/2022 at 0941.Hold Reason: Transfer to a Procedural area)   500 mg 100 mL/hr over 60 Minutes Intravenous 2 times daily 10/11/22 1856     10/11/22 1600  [MAR Hold]  cefTRIAXone (ROCEPHIN) 2 g in sodium chloride 0.9 % 100 mL IVPB        (MAR Hold since Wed 10/13/2022 at 0941.Hold Reason: Transfer to a Procedural area)   2 g 200 mL/hr over 30 Minutes Intravenous Every 24 hours 10/11/22 0958     10/10/22 1345  ceFAZolin (ANCEF)  IVPB 2g/100 mL premix  Status:  Discontinued        2 g 200 mL/hr over 30 Minutes Intravenous Every 8 hours 10/10/22 1341 10/10/22 1528   10/10/22 1045  ceFAZolin (ANCEF) IVPB 2g/100 mL premix  Status:  Discontinued        2 g 200 mL/hr over 30 Minutes Intravenous On call to O.R. 10/10/22 0947 10/10/22 1013   10/10/22 0955  metroNIDAZOLE (FLAGYL) 500 MG/100ML IVPB       Note to Pharmacy: Launa Flight M: cabinet override      10/10/22 0955 10/10/22 1115   10/09/22 2100  vancomycin (VANCOREADY) IVPB 1250 mg/250 mL  Status:  Discontinued        1,250 mg 166.7 mL/hr over 90 Minutes Intravenous Every 24 hours 10/09/22 1604 10/11/22 0957   10/09/22 1800  ceFEPIme (MAXIPIME) 2 g in sodium chloride 0.9 % 100 mL IVPB  Status:  Discontinued        2 g 200 mL/hr over 30 Minutes Intravenous Every 8 hours 10/09/22 1616 10/11/22 0957   10/09/22 0600  ceFEPIme (MAXIPIME) 2 g in sodium chloride 0.9 % 100 mL IVPB  Status:  Discontinued        2 g 200 mL/hr over 30 Minutes Intravenous Every 12 hours 10/08/22 2103 10/09/22 1616   10/08/22 2103  vancomycin variable dose per unstable renal function (pharmacist dosing)  Status:  Discontinued         Does not apply See admin instructions 10/08/22 2103 10/09/22 1604   10/08/22 2015  vancomycin (VANCOREADY) IVPB 2000 mg/400 mL        2,000 mg 200 mL/hr over 120 Minutes Intravenous  Once 10/08/22 2005 10/08/22 2250   10/08/22 1800  ceFEPIme (MAXIPIME) 2 g in sodium chloride 0.9 % 100 mL IVPB        2 g 200 mL/hr over 30 Minutes Intravenous  Once 10/08/22 1748 10/08/22 1916   10/08/22 1745  metroNIDAZOLE (FLAGYL) IVPB 500 mg  Status:  Discontinued        500 mg 100 mL/hr over 60 Minutes Intravenous Every 12 hours 10/08/22 1736 10/11/22 1121       Nutritional status:  Body mass index is 31.74 kg/m.  Nutrition Problem: Increased nutrient needs Etiology: wound healing, post-op healing  Signs/Symptoms: estimated needs     Objective: Vitals:   10/13/22  1145 10/13/22 1200  BP: 136/79 123/78  Pulse: 86 84  Resp: 20 12  Temp:    SpO2: 100% 100%    Intake/Output Summary (Last 24 hours) at 10/13/2022 1212 Last data filed at 10/13/2022 1134 Gross per 24 hour  Intake 700 ml  Output 600 ml  Net 100 ml    Filed Weights   10/08/22 1708 10/08/22 1947  Weight: 97.5 kg 97.5 kg   Weight change:  Body mass index is 31.74 kg/m.   Physical Exam: General exam: Middle-aged African-American male. Looks older for his stated age Skin: No rashes, lesions or ulcers. HEENT: Atraumatic, normocephalic, no obvious bleeding Lungs: Clear to auscultation bilaterally CVS: Regular rate and rhythm, no murmur GI/Abd soft, nontender, nondistended, bowel sound present CNS: Alert, awake, oriented x 3 Psychiatry: Mood appropriate Extremities: Right leg new BKA status.  Not in distress  Data Review: I have personally reviewed the laboratory data and studies available.  F/u labs ordered Unresulted Labs (From admission, onward)     Start     Ordered   10/14/22 0500  CBC with Differential/Platelet  Tomorrow morning,   R       Question:  Specimen collection method  Answer:  Lab=Lab collect   10/13/22 0826   10/14/22 0500  Basic metabolic panel  Tomorrow morning,   R       Question:  Specimen collection method  Answer:  Lab=Lab collect   10/13/22 0826   10/13/22 1110  Aerobic/Anaerobic Culture w Gram Stain (surgical/deep wound)  RELEASE UPON ORDERING,   TIMED       Comments: Specimen A: Phone [phone] Immunocompromised?  No  Antibiotic Treatment:  none Is the patient on airborne/droplet precautions? No Clinical History:  N/A Special Instructions:  none Specimen Disposition:  Microbiology     10/13/22 1110            Total time spent in review of labs and imaging, patient evaluation, formulation of plan, documentation and communication with family: 45 minutes  Signed, Lorin Glass, MD [facility] 10/13/2022

## 2022-10-13 NOTE — Anesthesia Postprocedure Evaluation (Signed)
Anesthesia Post Note  Patient: Engineer, mining  Procedure(s) Performed: REVISION RIGHT BELOW KNEE AMPUTATION (Right)     Patient location during evaluation: PACU Anesthesia Type: Regional Level of consciousness: awake and alert Pain management: pain level controlled Vital Signs Assessment: post-procedure vital signs reviewed and stable Respiratory status: spontaneous breathing, nonlabored ventilation, respiratory function stable and patient connected to nasal cannula oxygen Cardiovascular status: stable and blood pressure returned to baseline Postop Assessment: no apparent nausea or vomiting Anesthetic complications: no  No notable events documented.  Last Vitals:  Vitals:   10/13/22 1215 10/13/22 1223  BP: 136/76 136/77  Pulse: 84 84  Resp: 14 15  Temp:  36.5 C  SpO2: 99% 99%    Last Pain:  Vitals:   10/13/22 1132  TempSrc:   PainSc: 0-No pain                 Shelton Silvas

## 2022-10-13 NOTE — Op Note (Signed)
10/13/2022  11:35 AM  PATIENT:  Carlos [name]    PRE-OPERATIVE DIAGNOSIS:  Necrotizing Fascitis Right Leg  POST-OPERATIVE DIAGNOSIS:  Same  PROCEDURE:  REVISION RIGHT BELOW KNEE AMPUTATION Application Kerecis micro graft 38 cm. Fascia and tissue sent for cultures. Application of cleanse choice wound VAC sponges x 1. SURGEON:  [name], MD  PHYSICIAN ASSISTANT:None ANESTHESIA:   General  PREOPERATIVE INDICATIONS:  [name] is a  47 y.o. male with a diagnosis of Necrotizing Fascitis Right Leg who failed conservative measures and elected for surgical management.    The risks benefits and alternatives were discussed with the patient preoperatively including but not limited to the risks of infection, bleeding, nerve injury, cardiopulmonary complications, the need for revision surgery, among others, and the patient was willing to proceed.  OPERATIVE IMPLANTS:   Implant Name Type Inv. Item Serial No. Manufacturer Lot No. LRB No. Used Action  GRAFT SKIN WND MICRO 38 - [id number] Tissue GRAFT SKIN WND MICRO 38  [employer] [id number] Right 1 Implanted    @ENCIMAGES @  OPERATIVE FINDINGS: After debridement the muscle had good color and contractility.  There was some necrotic fascia.  Muscle and fascia was sent for cultures.  OPERATIVE PROCEDURE: Patient was brought the operating room after undergoing a regional anesthetic the right lower extremity was prepped using DuraPrep draped into a sterile field a timeout was called.  Revision transtibial amputation was performed with excision skin and soft tissue muscle fascia and bone.  Examination showed some ischemic muscle that was sharply excised with a 21 blade knife there is no purulence.  There was nonviable fascia and this was also excised.  The wound was irrigated with normal saline.  The distal half centimeter of the tibia and fibula were resected.  After hemostasis the wound bed was filled with Kerecis micro powder 38 cm.  The wound was  then closed over a cleanse choice wound VAC sponge with 2-0 nylon.  This was covered with derma tack and Ioban.  This had a good suction fit patient was taken the PACU in stable condition.   DISCHARGE PLANNING:  Antibiotic duration: Continue antibiotics for necrotizing fasciitis.  Patient's wound cultures and blood shoulders are showing 2 different strep organisms.  Plan to return to the operating room on Friday with anticipated wound closure.  Follow-up: In the office 1 week post operative.

## 2022-10-13 NOTE — Progress Notes (Signed)
PT Cancellation Note  Patient Details Name: Carlos [name] MRN: [id number] DOB: Jan 12, 1976   Cancelled Treatment:    Reason Eval/Treat Not Completed: (P) Patient at procedure or test/unavailable (Pt in surgery. PT will plan to follow up tomorrow.)   Johny Shock 10/13/2022, 12:53 PM

## 2022-10-13 NOTE — Progress Notes (Addendum)
Inpatient Rehab Admissions Coordinator:    Pt. In OR today, may return Friday. Not ready for CIR at this time, Will follow for admit once he's medically ready. I did speak with Pt.'s daughter, who confirms she lives with Pt. But works and cannot take time off. Pt. Will need to be able to reach mod I goals while on CIR in order to return home. Will have to see how he does with therapy post op to determine if this plan is feasible.   Megan Salon, MS, CCC-SLP Rehab Admissions Coordinator  [phone] (celll) [phone] (office)

## 2022-10-13 NOTE — Plan of Care (Signed)

## 2022-10-13 NOTE — Anesthesia Procedure Notes (Signed)
Anesthesia Regional Block: Adductor canal block   Pre-Anesthetic Checklist: , timeout performed,  Correct Patient, Correct Site, Correct Laterality,  Correct Procedure, Correct Position, site marked,  Risks and benefits discussed,  Surgical consent,  Pre-op evaluation,  At surgeon's request and post-op pain management  Laterality: Right  Prep: chloraprep       Needles:  Injection technique: Single-shot  Needle Type: Echogenic Stimulator Needle     Needle Length: 9cm  Needle Gauge: 21     Additional Needles:   Procedures:,,,, ultrasound used (permanent image in chart),,    Narrative:  Start time: 10/13/2022 10:30 AM End time: 10/13/2022 10:35 AM Injection made incrementally with aspirations every 5 mL.  Performed by: Personally  Anesthesiologist: Shelton Silvas, MD  Additional Notes: Discussed risks and benefits of the nerve block in detail, including but not limited vascular injury, permanent nerve damage and infection.   Patient tolerated the procedure well. Local anesthetic introduced in an incremental fashion under minimal resistance after negative aspirations. No paresthesias were elicited. After completion of the procedure, no acute issues were identified and patient continued to be monitored by RN.

## 2022-10-13 NOTE — Anesthesia Preprocedure Evaluation (Addendum)
Anesthesia Evaluation  Patient identified by MRN, date of birth, ID band Patient awake    Reviewed: Allergy & Precautions, NPO status , Patient's Chart, lab work & pertinent test results  Airway Mallampati: II  TM Distance: >3 FB Neck ROM: Full    Dental  (+) Teeth Intact, Dental Advisory Given   Pulmonary neg pulmonary ROS   breath sounds clear to auscultation       Cardiovascular  Rhythm:Regular Rate:Normal     Neuro/Psych negative neurological ROS  negative psych ROS   GI/Hepatic Neg liver ROS,,,  Endo/Other  diabetes    Renal/GU Renal disease     Musculoskeletal   Abdominal   Peds  Hematology   Anesthesia Other Findings   Reproductive/Obstetrics                             Anesthesia Physical Anesthesia Plan  ASA: 3  Anesthesia Plan: Regional   Post-op Pain Management: Minimal or no pain anticipated   Induction: Intravenous  PONV Risk Score and Plan: Propofol infusion, Ondansetron and Midazolam  Airway Management Planned: Natural Airway and Simple Face Mask  Additional Equipment: None  Intra-op Plan:   Post-operative Plan:   Informed Consent: I have reviewed the patients History and Physical, chart, labs and discussed the procedure including the risks, benefits and alternatives for the proposed anesthesia with the patient or authorized representative who has indicated his/her understanding and acceptance.       Plan Discussed with: CRNA  Anesthesia Plan Comments:        Anesthesia Quick Evaluation

## 2022-10-13 NOTE — Plan of Care (Signed)
?  Problem: Education: ?Goal: Knowledge of General Education information will improve ?Description: Including pain rating scale, medication(s)/side effects and non-pharmacologic comfort measures ?Outcome: Progressing ?  ?Problem: Health Behavior/Discharge Planning: ?Goal: Ability to manage health-related needs will improve ?Outcome: Progressing ?  ?Problem: Coping: ?Goal: Level of anxiety will decrease ?Outcome: Progressing ?  ?

## 2022-10-13 NOTE — Transfer of Care (Signed)
Immediate Anesthesia Transfer of Care Note  Patient: Engineer, mining  Procedure(s) Performed: REVISION RIGHT BELOW KNEE AMPUTATION (Right)  Patient Location: PACU  Anesthesia Type:MAC combined with regional for post-op pain  Level of Consciousness: awake, alert , and oriented  Airway & Oxygen Therapy: Patient Spontanous Breathing  Post-op Assessment: Report given to RN and Post -op Vital signs reviewed and stable  Post vital signs: Reviewed and stable  Last Vitals:  Vitals Value Taken Time  BP 107/68 10/13/22 1132  Temp 36.4 C 10/13/22 1132  Pulse 88 10/13/22 1134  Resp 17 10/13/22 1134  SpO2 100 % 10/13/22 1134  Vitals shown include unvalidated device data.  Last Pain:  Vitals:   10/13/22 0955  TempSrc: Oral  PainSc: 0-No pain         Complications: No notable events documented.

## 2022-10-13 NOTE — Progress Notes (Signed)
OT Cancellation Note  Patient Details Name: Carlos [name] MRN: [id number] DOB: [birth year]   Cancelled Treatment:    Reason Eval/Treat Not Completed: Patient declined, no reason specified (Pt declined to participate in therapy session this afternoon. Stated he'd like to wait until after his final surgery friday before working with therapy.)  Limmie Patricia, OTR/L,CBIS  Supplemental OT - [facility] Secure Chat Preferred   10/13/2022, 3:05 PM

## 2022-10-13 NOTE — Progress Notes (Signed)
    [facility] has been requested to perform a transesophageal echocardiogram on 10/14/2022 for bacteremia.  After careful review of history and examination, the risks and benefits of transesophageal echocardiogram have been explained including risks of esophageal damage, perforation (1:10,000 risk), bleeding, pharyngeal hematoma as well as other potential complications associated with conscious sedation including aspiration, arrhythmia, respiratory failure and death. Alternatives to treatment were discussed, questions were answered. Patient is willing to proceed.   Laverda Page, NP-C 10/13/2022 3:55 PM

## 2022-10-13 NOTE — Interval H&P Note (Signed)
History and Physical Interval Note:  10/13/2022 6:43 AM  Carlos [name]  has presented today for surgery, with the diagnosis of Necrotizing Fascitis Right Leg.  The various methods of treatment have been discussed with the patient and family. After consideration of risks, benefits and other options for treatment, the patient has consented to  Procedure(s): REVISION RIGHT BELOW KNEE AMPUTATION (Right) as a surgical intervention.  The patient's history has been reviewed, patient examined, no change in status, stable for surgery.  I have reviewed the patient's chart and labs.  Questions were answered to the patient's satisfaction.     [name]

## 2022-10-13 NOTE — Anesthesia Procedure Notes (Signed)
Anesthesia Regional Block: Popliteal block   Pre-Anesthetic Checklist: , timeout performed,  Correct Patient, Correct Site, Correct Laterality,  Correct Procedure, Correct Position, site marked,  Risks and benefits discussed,  Surgical consent,  Pre-op evaluation,  At surgeon's request and post-op pain management  Laterality: Right  Prep: chloraprep       Needles:  Injection technique: Single-shot  Needle Type: Echogenic Stimulator Needle     Needle Length: 9cm  Needle Gauge: 21     Additional Needles:   Procedures:,,,, ultrasound used (permanent image in chart),,    Narrative:  Start time: 10/13/2022 10:25 AM End time: 10/13/2022 10:30 AM Injection made incrementally with aspirations every 5 mL.  Performed by: Personally  Anesthesiologist: Shelton Silvas, MD  Additional Notes: Discussed risks and benefits of the nerve block in detail, including but not limited vascular injury, permanent nerve damage and infection.   Patient tolerated the procedure well. Local anesthetic introduced in an incremental fashion under minimal resistance after negative aspirations. No paresthesias were elicited. After completion of the procedure, no acute issues were identified and patient continued to be monitored by RN.

## 2022-10-14 ENCOUNTER — Encounter (HOSPITAL_COMMUNITY): Payer: Self-pay | Admitting: Orthopedic Surgery

## 2022-10-14 ENCOUNTER — Inpatient Hospital Stay (HOSPITAL_COMMUNITY): Payer: Medicaid Other | Admitting: Anesthesiology

## 2022-10-14 ENCOUNTER — Encounter (HOSPITAL_COMMUNITY)
Admission: EM | Disposition: A | Discharge status: Rehabilitation Facility | Payer: Self-pay | Source: Home / Self Care | Attending: Internal Medicine

## 2022-10-14 ENCOUNTER — Inpatient Hospital Stay (HOSPITAL_COMMUNITY): Payer: Medicaid Other

## 2022-10-14 DIAGNOSIS — R7881 Bacteremia: Secondary | ICD-10-CM

## 2022-10-14 DIAGNOSIS — E119 Type 2 diabetes mellitus without complications: Secondary | ICD-10-CM

## 2022-10-14 DIAGNOSIS — I340 Nonrheumatic mitral (valve) insufficiency: Secondary | ICD-10-CM

## 2022-10-14 HISTORY — PX: TEE WITHOUT CARDIOVERSION: SHX5443

## 2022-10-14 LAB — CBC WITH DIFFERENTIAL/PLATELET
Abs Immature Granulocytes: 1.04 10*3/uL — ABNORMAL HIGH (ref 0.00–0.07)
Basophils Absolute: 0.1 10*3/uL (ref 0.0–0.1)
Basophils Relative: 1 %
Eosinophils Absolute: 0.1 10*3/uL (ref 0.0–0.5)
Eosinophils Relative: 0 %
HCT: 27.9 % — ABNORMAL LOW (ref 39.0–52.0)
Hemoglobin: 9 g/dL — ABNORMAL LOW (ref 13.0–17.0)
Immature Granulocytes: 8 %
Lymphocytes Relative: 11 %
Lymphs Abs: 1.6 10*3/uL (ref 0.7–4.0)
MCH: 29.7 pg (ref 26.0–34.0)
MCHC: 32.3 g/dL (ref 30.0–36.0)
MCV: 92.1 fL (ref 80.0–100.0)
Monocytes Absolute: 1.2 10*3/uL — ABNORMAL HIGH (ref 0.1–1.0)
Monocytes Relative: 8 %
Neutro Abs: 10 10*3/uL — ABNORMAL HIGH (ref 1.7–7.7)
Neutrophils Relative %: 72 %
Platelets: 383 10*3/uL (ref 150–400)
RBC: 3.03 MIL/uL — ABNORMAL LOW (ref 4.22–5.81)
RDW: 13.2 % (ref 11.5–15.5)
WBC: 14 10*3/uL — ABNORMAL HIGH (ref 4.0–10.5)
nRBC: 0.1 % (ref 0.0–0.2)

## 2022-10-14 LAB — BASIC METABOLIC PANEL
Anion gap: 7 (ref 5–15)
BUN: 7 mg/dL (ref 6–20)
CO2: 24 mmol/L (ref 22–32)
Calcium: 7.3 mg/dL — ABNORMAL LOW (ref 8.9–10.3)
Chloride: 102 mmol/L (ref 98–111)
Creatinine, Ser: 0.67 mg/dL (ref 0.61–1.24)
GFR, Estimated: >60 mL/min (ref >60)
Glucose, Bld: 119 mg/dL — ABNORMAL HIGH (ref 70–99)
Potassium: 3.8 mmol/L (ref 3.5–5.1)
Sodium: 133 mmol/L — ABNORMAL LOW (ref 135–145)

## 2022-10-14 LAB — GLUCOSE, CAPILLARY
Glucose-Capillary: 110 mg/dL — ABNORMAL HIGH (ref 70–99)
Glucose-Capillary: 108 mg/dL — ABNORMAL HIGH (ref 70–99)
Glucose-Capillary: 119 mg/dL — ABNORMAL HIGH (ref 70–99)
Glucose-Capillary: 170 mg/dL — ABNORMAL HIGH (ref 70–99)
Glucose-Capillary: 158 mg/dL — ABNORMAL HIGH (ref 70–99)

## 2022-10-14 LAB — ECHO TEE

## 2022-10-14 LAB — CULTURE, BLOOD (ROUTINE X 2)

## 2022-10-14 LAB — AEROBIC/ANAEROBIC CULTURE W GRAM STAIN (SURGICAL/DEEP WOUND)

## 2022-10-14 SURGERY — ECHOCARDIOGRAM, TRANSESOPHAGEAL
Anesthesia: Monitor Anesthesia Care

## 2022-10-14 MED ORDER — SODIUM CHLORIDE 0.9 % IV SOLN: INTRAVENOUS | Status: DC

## 2022-10-14 MED ORDER — PHENYLEPHRINE HCL (PRESSORS) 10 MG/ML IV SOLN
INTRAVENOUS | Status: DC | PRN
  Administered 2022-10-14: 80 ug via INTRAVENOUS
  Administered 2022-10-14: 160 ug via INTRAVENOUS

## 2022-10-14 MED ORDER — LACTATED RINGERS IV SOLN: INTRAVENOUS | Status: DC | PRN

## 2022-10-14 MED ORDER — SODIUM CHLORIDE 0.9 % IV SOLN
3.0000 g | Freq: Four times a day (QID) | INTRAVENOUS | Status: DC
  Administered 2022-10-15 – 2022-10-19 (×16): 3 g via INTRAVENOUS
  Filled 2022-10-14 (×17): qty 8

## 2022-10-14 MED ORDER — PROPOFOL 500 MG/50ML IV EMUL
INTRAVENOUS | Status: DC | PRN
  Administered 2022-10-14: 100 ug/kg/min via INTRAVENOUS

## 2022-10-14 MED ORDER — PROPOFOL 10 MG/ML IV BOLUS
INTRAVENOUS | Status: DC | PRN
  Administered 2022-10-14: 20 mg via INTRAVENOUS
  Administered 2022-10-14: 50 mg via INTRAVENOUS

## 2022-10-14 MED ORDER — LIDOCAINE 2% (20 MG/ML) 5 ML SYRINGE
INTRAMUSCULAR | Status: DC | PRN
  Administered 2022-10-14: 100 mg via INTRAVENOUS

## 2022-10-14 NOTE — Progress Notes (Signed)
Patient continues to refuse all oral, sub-q, and IV medications, including, but not limited to: antibiotics, insulin, and anti-coagulation prophylaxis, that are ordered and part of care plan. MD updated and aware.

## 2022-10-14 NOTE — Progress Notes (Signed)
PT Cancellation Note  Patient Details Name: Trenidad Grizzel MRN: [id number] DOB: 07-19-1975   Cancelled Treatment:    Reason Eval/Treat Not Completed: Patient declined, no reason specified. Patient declined despite encouragement/education and is requesting for PT to follow up after revision surgery that is scheduled for tomorrow. Discussed with nurse also. PT to continue with attempts.   Donna Bernard, PT, MPT  Ina Homes 10/14/2022, 12:02 PM

## 2022-10-14 NOTE — Anesthesia Postprocedure Evaluation (Signed)
Anesthesia Post Note  Patient: Engineer, mining  Procedure(s) Performed: TRANSESOPHAGEAL ECHOCARDIOGRAM     Patient location during evaluation: PACU Anesthesia Type: MAC Level of consciousness: awake and alert Pain management: pain level controlled Vital Signs Assessment: post-procedure vital signs reviewed and stable Respiratory status: spontaneous breathing, nonlabored ventilation, respiratory function stable and patient connected to nasal cannula oxygen Cardiovascular status: stable and blood pressure returned to baseline Postop Assessment: no apparent nausea or vomiting Anesthetic complications: no   No notable events documented.  Last Vitals:  Vitals:   10/14/22 0930 10/14/22 0954  BP: (!) 145/84 (!) 154/83  Pulse: 91 96  Resp: 11 14  Temp:    SpO2: 100% 100%    Last Pain:  Vitals:   10/14/22 0903  TempSrc: Temporal  PainSc: Asleep                 Teana Lindahl S

## 2022-10-14 NOTE — CV Procedure (Signed)
     PROCEDURE NOTE:  Procedure:  Transesophageal echocardiogram Operator:  Armanda Magic, MD Indications:  Bacteremia Complications: None  During this procedure the patient is administered a total of Lidocaine 100mg  and Propofol 177 mg to achieve and maintain moderate conscious sedation.  The patient's heart rate, blood pressure, and oxygen saturation are monitored continuously during the procedure by anesthesia.   Results: Normal LV size and mildly reduced LV function with EF 45% Normal RV size and function with spontaneous echo contrast present. Normal LA and LA appendage with no evidence of thrombus.  There is spontaneous echo contrast present.  Normal TV with trivial TR Normal PV Normal MV with mild MR Normal trileaflet AV Normal interatrial septum with no evidence of shunt by colorflow dopper or agitated saline contrast injection Normal thoracic and ascending aorta. No evidence of vegetation  The patient tolerated the procedure well and was transferred back to their room in stable condition.  Signed: Armanda Magic, MD [facility]

## 2022-10-14 NOTE — Progress Notes (Signed)
Patient ID: Carlos [name], male   DOB: 05/15/1976, 46 y.o.   MRN: [id number] Patient is postoperative day 1 repeat debridement transtibial amputation for necrotizing fasciitis.  Patient's laboratory values continue to improve.  Hemoglobin 9.0 white cell count 14.0 with absolute neutrophil lymphocyte count of 10-1.6.  There is 100 cc in the wound VAC canister.  Plan to return to the operating room tomorrow for repeat debridement and wound closure. 

## 2022-10-14 NOTE — Transfer of Care (Signed)
Immediate Anesthesia Transfer of Care Note  Patient: Carlos [name]  Procedure(s) Performed: TRANSESOPHAGEAL ECHOCARDIOGRAM  Patient Location: Cath Lab  Anesthesia Type:MAC  Level of Consciousness: drowsy  Airway & Oxygen Therapy: Patient Spontanous Breathing and Patient connected to nasal cannula oxygen  Post-op Assessment: Report given to RN and Post -op Vital signs reviewed and stable  Post vital signs: Reviewed and stable  Last Vitals:  Vitals Value Taken Time  BP 104/58(73)   Temp 36.5 C 10/14/22 0903  Pulse 88 10/14/22 0903  Resp 20 10/14/22 0903  SpO2 99 % 10/14/22 0903  Vitals shown include unvalidated device data.  Last Pain:  Vitals:   10/14/22 0903  TempSrc: Temporal  PainSc: Asleep         Complications: No notable events documented.

## 2022-10-14 NOTE — Progress Notes (Signed)
  Echocardiogram Echocardiogram Transesophageal has been performed.  Delcie Roch 10/14/2022, 9:01 AM

## 2022-10-14 NOTE — Anesthesia Preprocedure Evaluation (Signed)
Anesthesia Evaluation  Patient identified by MRN, date of birth, ID band Patient awake    Reviewed: Allergy & Precautions, H&P , NPO status , Patient's Chart, lab work & pertinent test results  Airway Mallampati: II   Neck ROM: full    Dental   Pulmonary neg pulmonary ROS   breath sounds clear to auscultation       Cardiovascular  Rhythm:regular Rate:Normal  bacteremia   Neuro/Psych    GI/Hepatic   Endo/Other  diabetes, Type 2    Renal/GU      Musculoskeletal   Abdominal   Peds  Hematology   Anesthesia Other Findings   Reproductive/Obstetrics                             Anesthesia Physical Anesthesia Plan  ASA: 2  Anesthesia Plan: MAC   Post-op Pain Management:    Induction: Intravenous  PONV Risk Score and Plan: 1 and Propofol infusion, Treatment may vary due to age or medical condition and Midazolam  Airway Management Planned: Nasal Cannula  Additional Equipment:   Intra-op Plan:   Post-operative Plan:   Informed Consent: I have reviewed the patients History and Physical, chart, labs and discussed the procedure including the risks, benefits and alternatives for the proposed anesthesia with the patient or authorized representative who has indicated his/her understanding and acceptance.     Dental advisory given  Plan Discussed with: CRNA, Anesthesiologist and Surgeon  Anesthesia Plan Comments:        Anesthesia Quick Evaluation

## 2022-10-14 NOTE — Progress Notes (Signed)
Subjective:   Patient has stopped his antibiotics and refusing all other medications because he thinks that his infection is "cured"  Antibiotics:  Anti-infectives (From admission, onward)    Start     Dose/Rate Route Frequency Ordered Stop   10/14/22 1600  Ampicillin-Sulbactam (UNASYN) 3 g in sodium chloride 0.9 % 100 mL IVPB        3 g 200 mL/hr over 30 Minutes Intravenous Every 6 hours 10/14/22 1008     10/13/22 0600  ceFAZolin (ANCEF) IVPB 2g/100 mL premix        2 g 200 mL/hr over 30 Minutes Intravenous On call to O.R. 10/13/22 0207 10/13/22 1124   10/11/22 2100  metroNIDAZOLE (FLAGYL) tablet 500 mg  Status:  Discontinued        500 mg Oral Every 12 hours 10/11/22 1121 10/11/22 1856   10/11/22 2000  metroNIDAZOLE (FLAGYL) IVPB 500 mg  Status:  Discontinued        500 mg 100 mL/hr over 60 Minutes Intravenous 2 times daily 10/11/22 1856 10/14/22 1008   10/11/22 1600  cefTRIAXone (ROCEPHIN) 2 g in sodium chloride 0.9 % 100 mL IVPB  Status:  Discontinued        2 g 200 mL/hr over 30 Minutes Intravenous Every 24 hours 10/11/22 0958 10/14/22 1008   10/10/22 1345  ceFAZolin (ANCEF) IVPB 2g/100 mL premix  Status:  Discontinued        2 g 200 mL/hr over 30 Minutes Intravenous Every 8 hours 10/10/22 1341 10/10/22 1528   10/10/22 1045  ceFAZolin (ANCEF) IVPB 2g/100 mL premix  Status:  Discontinued        2 g 200 mL/hr over 30 Minutes Intravenous On call to O.R. 10/10/22 0947 10/10/22 1013   10/10/22 0955  metroNIDAZOLE (FLAGYL) 500 MG/100ML IVPB       Note to Pharmacy: Launa Flight M: cabinet override      10/10/22 0955 10/10/22 1115   10/09/22 2100  vancomycin (VANCOREADY) IVPB 1250 mg/250 mL  Status:  Discontinued        1,250 mg 166.7 mL/hr over 90 Minutes Intravenous Every 24 hours 10/09/22 1604 10/11/22 0957   10/09/22 1800  ceFEPIme (MAXIPIME) 2 g in sodium chloride 0.9 % 100 mL IVPB  Status:  Discontinued        2 g 200 mL/hr over 30 Minutes Intravenous Every 8  hours 10/09/22 1616 10/11/22 0957   10/09/22 0600  ceFEPIme (MAXIPIME) 2 g in sodium chloride 0.9 % 100 mL IVPB  Status:  Discontinued        2 g 200 mL/hr over 30 Minutes Intravenous Every 12 hours 10/08/22 2103 10/09/22 1616   10/08/22 2103  vancomycin variable dose per unstable renal function (pharmacist dosing)  Status:  Discontinued         Does not apply See admin instructions 10/08/22 2103 10/09/22 1604   10/08/22 2015  vancomycin (VANCOREADY) IVPB 2000 mg/400 mL        2,000 mg 200 mL/hr over 120 Minutes Intravenous  Once 10/08/22 2005 10/08/22 2250   10/08/22 1800  ceFEPIme (MAXIPIME) 2 g in sodium chloride 0.9 % 100 mL IVPB        2 g 200 mL/hr over 30 Minutes Intravenous  Once 10/08/22 1748 10/08/22 1916   10/08/22 1745  metroNIDAZOLE (FLAGYL) IVPB 500 mg  Status:  Discontinued        500 mg 100 mL/hr over 60 Minutes Intravenous Every 12 hours  10/08/22 1736 10/11/22 1121       Medications: Scheduled Meds:  (feeding supplement) PROSource Plus  30 mL Oral TID BM   sodium chloride   Intravenous Once   vitamin C  1,000 mg Oral Daily   Chlorhexidine Gluconate Cloth  6 each Topical Daily   docusate sodium  100 mg Oral Daily   docusate sodium  100 mg Oral Daily   heparin  5,000 Units Subcutaneous Q8H   insulin aspart  0-15 Units Subcutaneous TID WC   insulin aspart  0-5 Units Subcutaneous QHS   insulin glargine-yfgn  30 Units Subcutaneous QHS   insulin starter kit- pen needles  1 kit Other Once   living well with diabetes book   Does not apply Once   mupirocin ointment  1 Application Nasal BID   nutrition supplement (JUVEN)  1 packet Oral BID BM   pantoprazole  40 mg Oral Daily   zinc sulfate  220 mg Oral Daily   Continuous Infusions:  ampicillin-sulbactam (UNASYN) IV     magnesium sulfate bolus IVPB     magnesium sulfate bolus IVPB     PRN Meds:.acetaminophen, alum & mag hydroxide-simeth, bisacodyl, guaiFENesin-dextromethorphan, hydrALAZINE, HYDROmorphone (DILAUDID)  injection, labetalol, magnesium citrate, magnesium citrate, magnesium sulfate bolus IVPB, magnesium sulfate bolus IVPB, metoprolol tartrate, metoprolol tartrate, morphine injection, ondansetron, ondansetron, oxyCODONE, oxyCODONE, phenol, polyethylene glycol, potassium chloride, potassium chloride, senna-docusate    Objective: Weight change:   Intake/Output Summary (Last 24 hours) at 10/14/2022 1450 Last data filed at 10/14/2022 1149 Gross per 24 hour  Intake 1316.27 ml  Output 200 ml  Net 1116.27 ml    Blood pressure (!) 154/83, pulse 96, temperature 97.7 F (36.5 C), temperature source Temporal, resp. rate 14, height 5\' 9"  (1.753 m), weight 97.5 kg, SpO2 100 %. Temp:  [97.6 F (36.4 C)-98.4 F (36.9 C)] 97.7 F (36.5 C) (05/09 0903) Pulse Rate:  [89-96] 96 (05/09 0954) Resp:  [0-20] 14 (05/09 0954) BP: (113-156)/(62-87) 154/83 (05/09 0954) SpO2:  [98 %-100 %] 100 % (05/09 0954)  Physical Exam: Physical Exam Constitutional:      Appearance: He is well-developed.  HENT:     Head: Normocephalic and atraumatic.  Eyes:     Conjunctiva/sclera: Conjunctivae normal.  Cardiovascular:     Rate and Rhythm: Normal rate and regular rhythm.  Pulmonary:     Effort: Pulmonary effort is normal. No respiratory distress.     Breath sounds: No wheezing.  Abdominal:     General: There is no distension.     Palpations: Abdomen is soft.  Musculoskeletal:        General: Normal range of motion.     Cervical back: Normal range of motion and neck supple.  Skin:    General: Skin is warm and dry.     Findings: No erythema or rash.  Neurological:     General: No focal deficit present.     Mental Status: He is alert and oriented to person, place, and time.  Psychiatric:        Mood and Affect: Mood normal.        Behavior: Behavior normal.        Thought Content: Thought content normal.        Judgment: Judgment normal.      CBC:    BMET Recent Labs    10/13/22 0202 10/14/22 0153   NA 132* 133*  K 4.0 3.8  CL 99 102  CO2 24 24  GLUCOSE 160* 119*  BUN 10 7  CREATININE 0.79 0.67  CALCIUM 7.4* 7.3*      Liver Panel  No results for input(s): "PROT", "ALBUMIN", "AST", "ALT", "ALKPHOS", "BILITOT", "BILIDIR", "IBILI" in the last 72 hours.     Sedimentation Rate No results for input(s): "ESRSEDRATE" in the last 72 hours. C-Reactive Protein No results for input(s): "CRP" in the last 72 hours.  Micro Results: Recent Results (from the past 720 hour(s))  Culture, blood (Routine x 2)     Status: Abnormal   Collection Time: 10/08/22  5:15 PM   Specimen: BLOOD  Result Value Ref Range Status   Specimen Description BLOOD RIGHT FOOT  Final   Special Requests   Final    BOTTLES DRAWN AEROBIC AND ANAEROBIC Blood Culture results may not be optimal due to an inadequate volume of blood received in culture bottles   Culture  Setup Time   Final    GRAM POSITIVE COCCI IN BOTH AEROBIC AND ANAEROBIC BOTTLES CRITICAL VALUE NOTED.  VALUE IS CONSISTENT WITH PREVIOUSLY REPORTED AND CALLED VALUE.    Culture (A)  Final    STREPTOCOCCUS CONSTELLATUS CRITICAL RESULT CALLED TO, READ BACK BY AND VERIFIED WITH: PHARMD AUSTIN P. Performed at [facility], [address], [place], [place] [address]    Report Status 10/13/2022 FINAL  Final   Organism ID, Bacteria STREPTOCOCCUS CONSTELLATUS  Final      Susceptibility   Streptococcus constellatus - MIC*    PENICILLIN <=0.06 SENSITIVE Sensitive     CEFTRIAXONE <=0.12 SENSITIVE Sensitive     ERYTHROMYCIN <=0.12 SENSITIVE Sensitive     LEVOFLOXACIN <=0.25 SENSITIVE Sensitive     VANCOMYCIN 0.25 SENSITIVE Sensitive     * STREPTOCOCCUS CONSTELLATUS  Culture, blood (Routine x 2)     Status: Abnormal   Collection Time: 10/08/22  5:19 PM   Specimen: BLOOD  Result Value Ref Range Status   Specimen Description BLOOD RIGHT ANTECUBITAL  Final   Special Requests   Final    BOTTLES DRAWN AEROBIC AND ANAEROBIC Blood Culture adequate  volume   Culture  Setup Time   Final    IN BOTH AEROBIC AND ANAEROBIC BOTTLES GRAM POSITIVE COCCI Organism ID to follow CRITICAL RESULT CALLED TO, READ BACK BY AND VERIFIED WITH:  C/ PHARMD H. VON DOHLEN 10/09/22 1608 A. LAFRANCE Performed at [facility], [address] [place], [place] [address]    Culture STREPTOCOCCUS MITIS/ORALIS (A)  Final   Report Status 10/11/2022 FINAL  Final   Organism ID, Bacteria STREPTOCOCCUS MITIS/ORALIS  Final      Susceptibility   Streptococcus mitis/oralis - MIC*    TETRACYCLINE >=16 RESISTANT Resistant     VANCOMYCIN 0.5 SENSITIVE Sensitive     CLINDAMYCIN 0.5 INTERMEDIATE Intermediate     PENICILLIN Value in next row Sensitive      SENSITIVEMIC <=/ 0.06    * STREPTOCOCCUS MITIS/ORALIS  Blood Culture ID Panel (Reflexed)     Status: Abnormal   Collection Time: 10/08/22  5:19 PM  Result Value Ref Range Status   Enterococcus faecalis NOT DETECTED NOT DETECTED Final   Enterococcus Faecium NOT DETECTED NOT DETECTED Final   Listeria monocytogenes NOT DETECTED NOT DETECTED Final   Staphylococcus species NOT DETECTED NOT DETECTED Final   Staphylococcus aureus (BCID) NOT DETECTED NOT DETECTED Final   Staphylococcus epidermidis NOT DETECTED NOT DETECTED Final   Staphylococcus lugdunensis NOT DETECTED NOT DETECTED Final   Streptococcus species DETECTED (A) NOT DETECTED Final    Comment: Not  Enterococcus species, Streptococcus agalactiae, Streptococcus pyogenes, or Streptococcus pneumoniae. CRITICAL RESULT CALLED TO, READ BACK BY AND VERIFIED WITH:  C/ PHARMD H. VON DOHLEN 10/09/22 1608 A. LAFRANCE    Streptococcus agalactiae NOT DETECTED NOT DETECTED Final   Streptococcus pneumoniae NOT DETECTED NOT DETECTED Final   Streptococcus pyogenes NOT DETECTED NOT DETECTED Final   A.calcoaceticus-baumannii NOT DETECTED NOT DETECTED Final   Bacteroides fragilis NOT DETECTED NOT DETECTED Final   Enterobacterales NOT DETECTED NOT DETECTED Final    Enterobacter cloacae complex NOT DETECTED NOT DETECTED Final   Escherichia coli NOT DETECTED NOT DETECTED Final   Klebsiella aerogenes NOT DETECTED NOT DETECTED Final   Klebsiella oxytoca NOT DETECTED NOT DETECTED Final   Klebsiella pneumoniae NOT DETECTED NOT DETECTED Final   Proteus species NOT DETECTED NOT DETECTED Final   Salmonella species NOT DETECTED NOT DETECTED Final   Serratia marcescens NOT DETECTED NOT DETECTED Final   Haemophilus influenzae NOT DETECTED NOT DETECTED Final   Neisseria meningitidis NOT DETECTED NOT DETECTED Final   Pseudomonas aeruginosa NOT DETECTED NOT DETECTED Final   Stenotrophomonas maltophilia NOT DETECTED NOT DETECTED Final   Candida albicans NOT DETECTED NOT DETECTED Final   Candida auris NOT DETECTED NOT DETECTED Final   Candida glabrata NOT DETECTED NOT DETECTED Final   Candida krusei NOT DETECTED NOT DETECTED Final   Candida parapsilosis NOT DETECTED NOT DETECTED Final   Candida tropicalis NOT DETECTED NOT DETECTED Final   Cryptococcus neoformans/gattii NOT DETECTED NOT DETECTED Final    Comment: Performed at [facility], [address] [place], [place] [address]  Surgical pcr screen     Status: Abnormal   Collection Time: 10/10/22 10:57 AM   Specimen: Nasal Mucosa; Nasal Swab  Result Value Ref Range Status   MRSA, PCR NEGATIVE NEGATIVE Final   Staphylococcus aureus POSITIVE (A) NEGATIVE Final    Comment: RESULT CALLED TO, READ BACK BY AND VERIFIED WITH: 10/10/22 AT 1228 TO RN Hendricks Limes, ADC (NOTE) The Xpert SA Assay (FDA approved for NASAL specimens in patients 32 years of age and older), is one component of a comprehensive surveillance program. It is not intended to diagnose infection nor to guide or monitor treatment. Performed at [facility], [address], [place], [place] [address]   Aerobic/Anaerobic Culture w Gram Stain (surgical/deep wound)     Status: None (Preliminary result)   Collection Time: 10/10/22 11:33  AM   Specimen: Leg, Right; Tissue  Result Value Ref Range Status   Specimen Description TISSUE ABSCESS  Final   Special Requests RIGHT CALF, FLAGYL  Final   Gram Stain   Final    ABUNDANT WBC SEEN ABUNDANT GRAM POSITIVE COCCI Performed at [facility], [address], [place], [place] [address]    Culture   Final    RARE VIRIDANS STREPTOCOCCUS Standardized susceptibility testing for this organism is not available. NO ANAEROBES ISOLATED; CULTURE IN PROGRESS FOR 5 DAYS    Report Status PENDING  Incomplete  Culture, blood (Routine X 2) w Reflex to ID Panel     Status: None (Preliminary result)   Collection Time: 10/11/22 10:31 AM   Specimen: BLOOD RIGHT HAND  Result Value Ref Range Status   Specimen Description BLOOD RIGHT HAND  Final   Special Requests   Final    BOTTLES DRAWN AEROBIC AND ANAEROBIC Blood Culture adequate volume   Culture   Final    NO GROWTH 3 DAYS Performed at [facility], [address] [place], [place] [address]    Report Status PENDING  Incomplete  Culture, blood (Routine X 2) w Reflex to ID Panel     Status: None (Preliminary result)   Collection Time: 10/11/22 10:41 AM   Specimen: BLOOD RIGHT HAND  Result Value Ref Range Status   Specimen Description BLOOD RIGHT HAND  Final   Special Requests   Final    BOTTLES DRAWN AEROBIC ONLY Blood Culture results may not be optimal due to an inadequate volume of blood received in culture bottles   Culture   Final    NO GROWTH 3 DAYS Performed at [facility], [address] [place], [place] [address]    Report Status PENDING  Incomplete  Aerobic/Anaerobic Culture w Gram Stain (surgical/deep wound)     Status: None (Preliminary result)   Collection Time: 10/13/22 11:05 AM   Specimen: Leg, Right; Tissue  Result Value Ref Range Status   Specimen Description TISSUE  Final   Special Requests NONE  Final   Gram Stain   Final    RARE WBC PRESENT, PREDOMINANTLY MONONUCLEAR NO ORGANISMS SEEN     Culture   Final    NO GROWTH < 24 HOURS Performed at [facility], [address], [place], [place] [address]    Report Status PENDING  Incomplete    Studies/Results: ECHO TEE  Result Date: 10/14/2022    TRANSESOPHOGEAL ECHO REPORT   Patient Name:   Carlos [name] Date of Exam: 10/14/2022 Medical Rec #:  [id number]  Height:       69.0 in Accession #:    [id number] Weight:       214.9 lb Date of Birth:  1975-07-10 BSA:          2.130 m Patient Age:    46 years   BP:           147/85 mmHg Patient Gender: M          HR:           94 bpm. Exam Location:  Inpatient Procedure: Saline Contrast Bubble Study, Transesophageal Echo, Color Doppler and            Cardiac Doppler Indications:     Bacteremia  History:         Patient has prior history of Echocardiogram examinations, most                  recent 10/11/2022. History of stroke; Risk Factors:Diabetes.  Sonographer:     Delcie Roch RDCS Referring Phys:  [phone] Lillia Abed B ROBERTS Diagnosing Phys: Armanda Magic MD PROCEDURE: After discussion of the risks and benefits of a TEE, an informed consent was obtained from the patient. The transesophogeal probe was passed without difficulty through the esophogus of the patient. Imaged were obtained with the patient in a left lateral decubitus position. Sedation performed by different physician. The patient was monitored while under deep sedation. Anesthestetic sedation was provided intravenously by Anesthesiology: 177mg  of Propofol, 100mg  of Lidocaine. The patient's vital signs; including heart rate, blood pressure, and oxygen saturation; remained stable throughout the procedure. The patient developed no complications during the procedure.  IMPRESSIONS  1. Left ventricular ejection fraction, by estimation, is 40 to 45%. The left ventricle has mildly decreased function. The left ventricle demonstrates global hypokinesis.  2. Right ventricular systolic function is normal. The right ventricular size is normal.  3. No left  atrial/left atrial appendage thrombus was detected.  4. The mitral valve  is normal in structure. Mild mitral valve regurgitation. No evidence of mitral stenosis.  5. 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 It is not shaggy and mobile as would be seen with a vegetation. It is only seen at around 105 degrees and not in any other views.. The aortic valve is tricuspid. Aortic valve regurgitation is not visualized. Aortic valve sclerosis/calcification is present, without any evidence of aortic stenosis.  6. The inferior vena cava is normal in size with greater than 50% respiratory variability, suggesting right atrial pressure of 3 mmHg. Conclusion(s)/Recommendation(s): Normal biventricular function without evidence of hemodynamically significant valvular heart disease. FINDINGS  Left Ventricle: Left ventricular ejection fraction, by estimation, is 40 to 45%. The left ventricle has mildly decreased function. The left ventricle demonstrates global hypokinesis. The left ventricular internal cavity size was normal in size. There is  no left ventricular hypertrophy. Right Ventricle: The right ventricular size is normal. No increase in right ventricular wall thickness. Right ventricular systolic function is normal. Left Atrium: Left atrial size was normal in size. No left atrial/left atrial appendage thrombus was detected. Right Atrium: Right atrial size was normal in size. Pericardium: There is no evidence of pericardial effusion. Mitral Valve: The mitral valve is normal in structure. Mild mitral valve regurgitation. No evidence of mitral valve stenosis. Tricuspid Valve: The tricuspid valve is normal in structure. Tricuspid valve regurgitation is trivial. No evidence of tricuspid stenosis. 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 It is not shaggy and  mobile as would be seen with a vegetation. It is only seen at around 105 degrees and not in any other views. The aortic valve is tricuspid. Aortic valve regurgitation is not visualized. Aortic valve sclerosis/calcification is present, without any evidence of aortic stenosis. Pulmonic Valve: The pulmonic valve was normal in structure. Pulmonic valve regurgitation is trivial. No evidence of pulmonic stenosis. Aorta: The aortic root is normal in size and structure. Venous: The inferior vena cava is normal in size with greater than 50% respiratory variability, suggesting right atrial pressure of 3 mmHg. IAS/Shunts: No atrial level shunt detected by color flow Doppler. Agitated saline contrast was given intravenously to evaluate for intracardiac shunting. Armanda Magic MD Electronically signed by Armanda Magic MD Signature Date/Time: 10/14/2022/1:34:33 PM    Final    EP STUDY  Result Date: 10/14/2022 See surgical note for result.     Assessment/Plan:  INTERVAL HISTORY:   Pt refusing abx  TEE clean   Principal Problem:   Diabetic foot ulcer (HCC) Active Problems:   Diabetes (HCC)   Severe sepsis (HCC)   Hyponatremia   Lactic acidosis   AKI (acute kidney injury) (HCC)   Gastroparesis   Elevated troponin   H/O: CVA (cerebrovascular accident)   Gangrene of right foot (HCC)   Cellulitis of right lower extremity   Necrotizing fasciitis of lower leg (HCC)    Carlos [name] is a 47 y.o. 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 He is growing a viridans from operative site. Report is that S testing not available for Viridans?  #  1 Streptococcal bacteremia   IF HE WILL GO BACK TO TAKING  ANTIBIOTICS WOULD ADVOCATE FOR UNASYn he is currently rx  I would feel much better if he had 4 weeks of postoperative biotics to make sure that we have eradicated his bloodstream infection and any micro scopic infection in his leg that has not been debrided or removed.  Certainly this could be done with oral antibiotics though he says with his gastroparesis he does not absorb oral medications well  THE REASON HE IS CURRENTLY ALIVE IS BECAUSE HE RECEIVED ANTIBIOTICS AND SURGERY. If he had not had BOTH he would be dead. If he does not continue with antibiotics he will be at EXCEEDINGLY high risk for recurrence of bacteremia and infection in his leg resulting in more morbid and or mortal consequences    I have personally spent 54 minutes involved in face-to-face and non-face-to-face activities for this patient on the day of the visit. Professional time spent includes the following activities: Preparing to see the patient (review of tests), Obtaining and/or reviewing separately obtained history (admission/discharge record), Performing a medically appropriate examination and/or evaluation , Ordering medications/tests/procedures, referring and communicating with other health care professionals, Documenting clinical information in the EMR, Independently interpreting results (not separately reported), Communicating results to the patient/family/caregiver, Counseling and educating the patient/family/caregiver and Care coordination (not separately reported).      LOS: 6 days   Acey Lav 10/14/2022, 2:50 PM

## 2022-10-14 NOTE — Progress Notes (Signed)
PROGRESS NOTE  Carlos [name]  DOB: 1975-06-27  PCP: System, Provider Not In VWU:[id number]  DOA: 10/08/2022  LOS: 6 days  Hospital Day: 7  Brief narrative: Carlos [name] is a 47 y.o. male with PMH significant for untreated DM2, gastroparesis, CVA, MRSA cellulitis 2021 5/3, patient presented to the ED with complaint of progressive swelling and blackening of right big toe for 3 weeks. 2 weeks ago, hewas seen at urgent care center and was prescribed a course of doxycycline which hecould not tolerate because of nausea and vomiting. His wound continued to grow. He also started having fever, chills, and hence came to the ED.  In the ED, patient had a fever of 102.6, heart rate 126, blood pressure in 90s, breathing on room air She was noted to have edematous right great toe with weeping, foul order and eschar Initial labs with WBC count 27.7, lactic acid 3.3>7.4>2.5 Blood culture was collected CT right foot with contrast showed extensive subcutaneous and intramuscular gas within the intrinsic musculature of the right foot as well as the terminal visualized right lower extremity musculature, findings suggestive of necrotizing fasciitis.  Acute osteomyelitis of first metatarsal head.  Patient was given IV fluid bolus, broad-spectrum IV antibiotics Admitted to [facility] Orthopedics was consulted. 5/5, patient underwent right BKA by Dr. Lajoyce Corners Possible for details  Subjective: Patient was seen and examined this morning. Has been refusing IV and oral medicines.  He states that he believes his infection has cleared up.  He states 'I do not want to be a [place] pig for y'all' Daughter at bedside. TEE this morning did not show any vegetation.  Assessment and plan: Severe sepsis - POA Necrotizing fasciitis of right lower extremity S/p right BKA -5/5 Dr. Lajoyce Corners; revision right BKA 5/8 Streptococcal bacteremia Presented with worsening right foot wound. Imaging as above showing gas gangrene as well as necrotizing  fasciitis Procedures as above. Blood culture sent on admission as well as wound culture grew Streptococcus.  TTE/TEE negative for vegetation.   WBC count improving.  Lactate level improved. Patient has been counseled by multiple providers to comply with IV antibiotics. Recent Labs  Lab 10/08/22 1945 10/09/22 0118 10/09/22 0430 10/09/22 0803 10/10/22 0150 10/11/22 0049 10/12/22 0222 10/14/22 0153  WBC  --   --   --  27.7* 28.7* 25.3* 20.2* 14.0*  LATICACIDVEN 7.4* 2.5* 2.7* 3.1* 1.8  --   --   --     AKI Lactic acidosis Due to tissue hypoperfusion and renal hypoperfusion. Creatinine was normal in March 2023.  Presented with creatinine elevated 2.05.  Gradually down trended to normal with IV hydration. Recent Labs    10/08/22 1719 10/08/22 2109 10/09/22 0430 10/09/22 0803 10/10/22 0150 10/11/22 0049 10/12/22 0222 10/13/22 0202 10/14/22 0153  BUN 19 23* 23* 19 16 21* 16 10 7   CREATININE 2.05* 1.88* 1.57* 1.43* 1.28* 1.14 0.99 0.79 0.67     Acute hyponatremia baseline sodium level was normal.  Presented with low level of 123.   Serum osmolality is normal but urine osmolality is elevated.   Sodium level fluctuating, at 133 today.  Continue to monitor Recent Labs  Lab 10/08/22 1719 10/08/22 2109 10/09/22 0430 10/09/22 0803 10/10/22 0150 10/11/22 0049 10/12/22 0222 10/13/22 0202 10/14/22 0153  NA 123* 123* 125* 129* 126* 127* 128* 132* 133*    Elevated troponin Demand ischemia likely secondary to sepsis/infection.  No active anginal symptoms. However, given his significant cardiac risk factors, he may have underlying CAD as well. Follow-up with  cardiology as an outpatient for risk stratification.  Type 2 diabetes mellitus uncontrolled with hyperglycemia A1c 10.4 on 10/08/2022.  Presented with a blood sugar level elevated over 500 PTA not on any meds. Currently on Semglee 30 units nightly along with moderate SSI/Accu-Cheks Recent Labs  Lab 10/13/22 1650  10/13/22 2102 10/14/22 0800 10/14/22 0958 10/14/22 1141  GLUCAP 157* 112* 110* 108* 119*    Diabetic gastroparesis PTA not on any meds.   Patient is complaining of nausea with anything he eats.  I offered him IV Reglan but he states it did not work for him in the past and he does not want to try it again.   H/o CVA Not on any meds    Mobility: PT eval postprocedure  Goals of care   Code Status: Full Code    DVT prophylaxis:  SCD's Start: 10/13/22 1248 SCD's Start: 10/10/22 1342 heparin injection 5,000 Units Start: 10/09/22 2200   Antimicrobials: IV ceftriaxone, IV Flagyl Fluid: Can stop IV fluid. Consultants: Orthopedics Family Communication: None at bedside  Status: Inpatient Level of care:  Telemetry Medical   Patient from: Home Anticipated d/c to: Pending clinical course Needs to continue in-hospital care:  Pending repeat OR tomorrow   Diet:  Diet Order             Diet Carb Modified Fluid consistency: Thin; Room service appropriate? Yes  Diet effective now                   Scheduled Meds:  (feeding supplement) PROSource Plus  30 mL Oral TID BM   sodium chloride   Intravenous Once   vitamin C  1,000 mg Oral Daily   Chlorhexidine Gluconate Cloth  6 each Topical Daily   docusate sodium  100 mg Oral Daily   docusate sodium  100 mg Oral Daily   heparin  5,000 Units Subcutaneous Q8H   insulin aspart  0-15 Units Subcutaneous TID WC   insulin aspart  0-5 Units Subcutaneous QHS   insulin glargine-yfgn  30 Units Subcutaneous QHS   insulin starter kit- pen needles  1 kit Other Once   living well with diabetes book   Does not apply Once   mupirocin ointment  1 Application Nasal BID   nutrition supplement (JUVEN)  1 packet Oral BID BM   pantoprazole  40 mg Oral Daily   zinc sulfate  220 mg Oral Daily    PRN meds: acetaminophen, alum & mag hydroxide-simeth, bisacodyl, guaiFENesin-dextromethorphan, hydrALAZINE, HYDROmorphone (DILAUDID) injection,  labetalol, magnesium citrate, magnesium citrate, magnesium sulfate bolus IVPB, magnesium sulfate bolus IVPB, metoprolol tartrate, metoprolol tartrate, morphine injection, ondansetron, ondansetron, oxyCODONE, oxyCODONE, phenol, polyethylene glycol, potassium chloride, potassium chloride, senna-docusate   Infusions:   sodium chloride 75 mL/hr at 10/13/22 1505   sodium chloride Stopped (10/11/22 1221)   sodium chloride Stopped (10/14/22 0904)   ampicillin-sulbactam (UNASYN) IV     magnesium sulfate bolus IVPB     magnesium sulfate bolus IVPB      Antimicrobials: Anti-infectives (From admission, onward)    Start     Dose/Rate Route Frequency Ordered Stop   10/14/22 1600  Ampicillin-Sulbactam (UNASYN) 3 g in sodium chloride 0.9 % 100 mL IVPB        3 g 200 mL/hr over 30 Minutes Intravenous Every 6 hours 10/14/22 1008     10/13/22 0600  ceFAZolin (ANCEF) IVPB 2g/100 mL premix        2 g 200 mL/hr over 30 Minutes Intravenous On call  to O.R. 10/13/22 0207 10/13/22 1124   10/11/22 2100  metroNIDAZOLE (FLAGYL) tablet 500 mg  Status:  Discontinued        500 mg Oral Every 12 hours 10/11/22 1121 10/11/22 1856   10/11/22 2000  metroNIDAZOLE (FLAGYL) IVPB 500 mg  Status:  Discontinued        500 mg 100 mL/hr over 60 Minutes Intravenous 2 times daily 10/11/22 1856 10/14/22 1008   10/11/22 1600  cefTRIAXone (ROCEPHIN) 2 g in sodium chloride 0.9 % 100 mL IVPB  Status:  Discontinued        2 g 200 mL/hr over 30 Minutes Intravenous Every 24 hours 10/11/22 0958 10/14/22 1008   10/10/22 1345  ceFAZolin (ANCEF) IVPB 2g/100 mL premix  Status:  Discontinued        2 g 200 mL/hr over 30 Minutes Intravenous Every 8 hours 10/10/22 1341 10/10/22 1528   10/10/22 1045  ceFAZolin (ANCEF) IVPB 2g/100 mL premix  Status:  Discontinued        2 g 200 mL/hr over 30 Minutes Intravenous On call to O.R. 10/10/22 0947 10/10/22 1013   10/10/22 0955  metroNIDAZOLE (FLAGYL) 500 MG/100ML IVPB       Note to Pharmacy: Launa Flight M: cabinet override      10/10/22 0955 10/10/22 1115   10/09/22 2100  vancomycin (VANCOREADY) IVPB 1250 mg/250 mL  Status:  Discontinued        1,250 mg 166.7 mL/hr over 90 Minutes Intravenous Every 24 hours 10/09/22 1604 10/11/22 0957   10/09/22 1800  ceFEPIme (MAXIPIME) 2 g in sodium chloride 0.9 % 100 mL IVPB  Status:  Discontinued        2 g 200 mL/hr over 30 Minutes Intravenous Every 8 hours 10/09/22 1616 10/11/22 0957   10/09/22 0600  ceFEPIme (MAXIPIME) 2 g in sodium chloride 0.9 % 100 mL IVPB  Status:  Discontinued        2 g 200 mL/hr over 30 Minutes Intravenous Every 12 hours 10/08/22 2103 10/09/22 1616   10/08/22 2103  vancomycin variable dose per unstable renal function (pharmacist dosing)  Status:  Discontinued         Does not apply See admin instructions 10/08/22 2103 10/09/22 1604   10/08/22 2015  vancomycin (VANCOREADY) IVPB 2000 mg/400 mL        2,000 mg 200 mL/hr over 120 Minutes Intravenous  Once 10/08/22 2005 10/08/22 2250   10/08/22 1800  ceFEPIme (MAXIPIME) 2 g in sodium chloride 0.9 % 100 mL IVPB        2 g 200 mL/hr over 30 Minutes Intravenous  Once 10/08/22 1748 10/08/22 1916   10/08/22 1745  metroNIDAZOLE (FLAGYL) IVPB 500 mg  Status:  Discontinued        500 mg 100 mL/hr over 60 Minutes Intravenous Every 12 hours 10/08/22 1736 10/11/22 1121       Nutritional status:  Body mass index is 31.74 kg/m.  Nutrition Problem: Increased nutrient needs Etiology: wound healing, post-op healing Signs/Symptoms: estimated needs     Objective: Vitals:   10/14/22 0930 10/14/22 0954  BP: (!) 145/84 (!) 154/83  Pulse: 91 96  Resp: 11 14  Temp:    SpO2: 100% 100%    Intake/Output Summary (Last 24 hours) at 10/14/2022 1429 Last data filed at 10/14/2022 1149 Gross per 24 hour  Intake 1316.27 ml  Output 200 ml  Net 1116.27 ml    Filed Weights   10/08/22 1708 10/08/22 1947  Weight: 97.5  kg 97.5 kg   Weight change:  Body mass index is 31.74 kg/m.    Physical Exam: General exam: Middle-aged African-American male. Looks older for his stated age Skin: No rashes, lesions or ulcers. HEENT: Atraumatic, normocephalic, no obvious bleeding Lungs: Clear to auscultation bilaterally CVS: Regular rate and rhythm, no murmur GI/Abd soft, nontender, nondistended, bowel sound present CNS: Alert, awake, oriented x 3 Psychiatry: Agitated Extremities: Right leg new BKA status.  Not in distress  Data Review: I have personally reviewed the laboratory data and studies available.  F/u labs ordered Unresulted Labs (From admission, onward)    None       Total time spent in review of labs and imaging, patient evaluation, formulation of plan, documentation and communication with family: 45 minutes  Signed, Lorin Glass, MD [facility] 10/14/2022

## 2022-10-14 NOTE — Anesthesia Preprocedure Evaluation (Addendum)
Anesthesia Evaluation  Patient identified by MRN, date of birth, ID band Patient awake    Reviewed: Allergy & Precautions, NPO status , Patient's Chart, lab work & pertinent test results  Airway Mallampati: II  TM Distance: >3 FB Neck ROM: Full    Dental  (+) Teeth Intact, Dental Advisory Given   Pulmonary neg pulmonary ROS   breath sounds clear to auscultation       Cardiovascular hypertension, Pt. on medications and Pt. on home beta blockers  Rhythm:Regular Rate:Normal  10/14/2022 TEE  1. Left ventricular ejection fraction, by estimation, is 40 to 45%. The  left ventricle has mildly decreased function. The left ventricle  demonstrates global hypokinesis.   2. Right ventricular systolic function is normal. The right ventricular  size is normal.   3. No left atrial/left atrial appendage thrombus was detected.   4. The mitral valve is normal in structure. Mild mitral valve  regurgitation. No evidence of mitral stenosis.   5. There is a very small pin point nidus of calcium on the noncoronary  cusp that likely represents early calcific AV disease. It is not shaggy  and mobile as would be seen with a vegetation. It is only seen at around  105 degrees and not in any other  views.. The aortic valve is tricuspid. Aortic valve regurgitation is not  visualized. Aortic valve sclerosis/calcification is present, without any  evidence of aortic stenosis.   6. The inferior vena cava is normal in size with greater than 50%  respiratory variability, suggesting right atrial pressure of 3 mmHg.     Neuro/Psych negative neurological ROS  negative psych ROS   GI/Hepatic negative GI ROS, Neg liver ROS,,,  Endo/Other  diabetes, Insulin Dependent    Renal/GU Renal diseaseLab Results      Component                Value               Date                      CREATININE               0.67                10/14/2022                BUN                       7                   10/14/2022                NA                       133 (L)             10/14/2022                K                        3.8                 10/14/2022                CL                       102  10/14/2022                CO2                      24                  10/14/2022                Musculoskeletal negative musculoskeletal ROS (+)    Abdominal   Peds  Hematology  (+) Blood dyscrasia, anemia Lab Results      Component                Value               Date                      WBC                      14.0 (H)            10/14/2022                HGB                      9.0 (L)             10/14/2022                HCT                      27.9 (L)            10/14/2022                MCV                      92.1                10/14/2022                PLT                      383                 10/14/2022              Anesthesia Other Findings   Reproductive/Obstetrics                             Anesthesia Physical Anesthesia Plan  ASA: 3  Anesthesia Plan: Regional   Post-op Pain Management: Minimal or no pain anticipated, Regional block*, Precedex and Tylenol PO (pre-op)*   Induction:   PONV Risk Score and Plan: Propofol infusion, Ondansetron and Midazolam  Airway Management Planned: Natural Airway and Nasal Cannula  Additional Equipment: None  Intra-op Plan:   Post-operative Plan:   Informed Consent: I have reviewed the patients History and Physical, chart, labs and discussed the procedure including the risks, benefits and alternatives for the proposed anesthesia with the patient or authorized representative who has indicated his/her understanding and acceptance.       Plan Discussed with: CRNA  Anesthesia Plan Comments: (R Popliteal + R adductor canal)       Anesthesia Quick Evaluation

## 2022-10-14 NOTE — TOC Initial Note (Addendum)
Transition of Care [facility]) - Initial/Assessment Note    Patient Details  Name: Carlos [name] MRN: [id number] Date of Birth: 03/05/1976  [facility] CM/SW Contact:    Epifanio Lesches, RN Phone Number: 10/14/2022, 4:07 PM  Clinical Narrative:                      - s/p R BKA , 10/10/22 ,  REVISION RIGHT BELOW KNEE AMPUTATION 5/8 Plan:  return to the operating room on Friday with anticipated wound closure.   NCM spoke with pt regarding d/c planning. Pt is from home with daughter. CIR following for potential admit.  States daughter is in school, however, will assist with care once d/c to home. Pt states not interested in SNF/rehab if needed.  Backup plan :  home health services. Pt without health insurance. Pt states recently lost job a week ago. Referral made with [facility] for charity home health PT/OT services, acceptance pending.  Financial Counselor referral made for Medicaid screening.   Pt without PCP. NCM  shared [facility], pt interested. NCM to f/u with appointment time.  Referral made with [facility] / [facility] for charity home health PT/OT services, acceptance pending.  Order placed for DME needs( RW, W/C, BSC). Referral will be made for  charity DME closer to d/c day if needed.  TOC team following for needs...  Expected Discharge Plan: Home w Home Health Services (vs SNF) Barriers to Discharge: Continued Medical Work up   Patient Goals and CMS Choice            Expected Discharge Plan and Services                                              Prior Living Arrangements/Services     Patient language and need for interpreter reviewed:: Yes Do you feel safe going back to the place where you live?: Yes      Need for Family Participation in Patient Care: Yes (Comment) Care giver support system in place?: No (comment)   Criminal Activity/Legal Involvement Pertinent to Current Situation/Hospitalization: No - Comment as  needed  Activities of Daily Living Home Assistive Devices/Equipment: None ADL Screening (condition at time of admission) Patient's cognitive ability adequate to safely complete daily activities?: Yes Is the patient deaf or have difficulty hearing?: No Does the patient have difficulty seeing, even when wearing glasses/contacts?: No Does the patient have difficulty concentrating, remembering, or making decisions?: No Patient able to express need for assistance with ADLs?: Yes Does the patient have difficulty dressing or bathing?: No Independently performs ADLs?: Yes (appropriate for developmental age) Does the patient have difficulty walking or climbing stairs?: No Weakness of Legs: Both Weakness of Arms/Hands: Both  Permission Sought/Granted   Permission granted to share information with : Yes, Verbal Permission Granted              Emotional Assessment Appearance:: Appears stated age Attitude/Demeanor/Rapport: Engaged Affect (typically observed): Accepting Orientation: : Oriented to Self, Oriented to Place, Oriented to  Time, Oriented to Situation Alcohol / Substance Use: Not Applicable Psych Involvement: No (comment)  Admission diagnosis:  Hyponatremia [E87.1] Hyperglycemia [R73.9] Diabetic foot ulcer (HCC) [W09.811, L97.509] Elevated troponin [R79.89] Cellulitis of right lower extremity [L03.115] Acute kidney injury (HCC) [N17.9] Severe sepsis (HCC) [A41.9, R65.20] Gangrene of right foot (HCC) [I96] Patient Active  Problem List   Diagnosis Date Noted   Necrotizing fasciitis of lower leg (HCC) 10/10/2022   Gangrene of right foot (HCC) 10/09/2022   Cellulitis of right lower extremity 10/09/2022   Diabetic foot ulcer (HCC) 10/08/2022   Diabetes (HCC) 10/08/2022   Severe sepsis (HCC) 10/08/2022   Hyponatremia 10/08/2022   Lactic acidosis 10/08/2022   AKI (acute kidney injury) (HCC) 10/08/2022   Gastroparesis 10/08/2022   Elevated troponin 10/08/2022   H/O: CVA  (cerebrovascular accident) 10/08/2022   PCP:  System, Provider Not In Pharmacy:   [id number] - [place] - [address] [place] ([place] [address] Phone: [phone] Fax: [phone]  [facility] - [address], [place] - [address] [place] [address] Phone: [phone] Fax: [phone]     Social Determinants of Health (SDOH) Social History: SDOH Screenings   Food Insecurity: No Food Insecurity (10/08/2022)  Housing: Medium Risk (10/08/2022)  Transportation Needs: No Transportation Needs (10/08/2022)  Utilities: Not At Risk (10/08/2022)  Tobacco Use: Unknown (10/14/2022)   SDOH Interventions:     Readmission Risk Interventions     No data to display

## 2022-10-14 NOTE — H&P (View-Only) (Signed)
Patient ID: Carlos [name], male   DOB: [birth year], 47 y.o.   MRN: [id number] Patient is postoperative day 1 repeat debridement transtibial amputation for necrotizing fasciitis.  Patient's laboratory values continue to improve.  Hemoglobin 9.0 white cell count 14.0 with absolute neutrophil lymphocyte count of 10-1.6.  There is 100 cc in the wound VAC canister.  Plan to return to the operating room tomorrow for repeat debridement and wound closure.

## 2022-10-14 NOTE — Inpatient Diabetes Management (Signed)
Inpatient Diabetes Program Recommendations  AACE/ADA: New Consensus Statement on Inpatient Glycemic Control (2015)  Target Ranges:  Prepandial:   less than 140 mg/dL      Peak postprandial:   less than 180 mg/dL (1-2 hours)      Critically ill patients:  140 - 180 mg/dL   Lab Results  Component Value Date   GLUCAP 119 (H) 10/14/2022   HGBA1C 10.4 (H) 10/08/2022    Review of Glycemic Control  Diabetes history: type 2 Outpatient Diabetes medications: none (had taken Metformin about 3-4 years ago) Current orders for Inpatient glycemic control: Semglee 30 units at HS, Novolog 0-15 units correction scale TID  Inpatient Diabetes Program Recommendations:   Spoke with patient at the bedside. Stated that he was taking Metformin in the past, but it had given him gastroparesis and he stopped taking it about 3 - 4 years ago. States that he does not want to take any medication for diabetes, he wants to follow a good diet and exercise. Needs a PCP. Has just lost his health insurance last week, would like to apply for Medicaid if possible.   Reviewed the plate method for eating. He has very specific foods that he cannot eat due to his gastroparesis. He has figured out that he can eat salads, fruit, nuts, and vegetables; no meat. He seems to know what he needs to do. He does not check blood sugars at home. Lives with daughter.   Living Well with Diabetes booklet ordered from pharmacy. Will place a case manager consult for help with getting a PCP. Needs help with getting signed up for Medicaid.  Will continue to monitor blood sugars while in the hospital.  Smith Mince RN BSN CDE Diabetes Coordinator Pager: [phone]  8am-5pm

## 2022-10-14 NOTE — Interval H&P Note (Signed)
History and Physical Interval Note:  10/14/2022 8:29 AM  Carlos [name]  has presented today for surgery, with the diagnosis of BACTOREMIA.  The various methods of treatment have been discussed with the patient and family. After consideration of risks, benefits and other options for treatment, the patient has consented to  Procedure(s): TRANSESOPHAGEAL ECHOCARDIOGRAM (N/A) as a surgical intervention.  The patient's history has been reviewed, patient examined, no change in status, stable for surgery.  I have reviewed the patient's chart and labs.  Questions were answered to the patient's satisfaction.     Armanda Magic

## 2022-10-15 ENCOUNTER — Inpatient Hospital Stay (HOSPITAL_COMMUNITY): Payer: Medicaid Other | Admitting: Anesthesiology

## 2022-10-15 ENCOUNTER — Encounter (HOSPITAL_COMMUNITY): Payer: Self-pay | Admitting: Cardiology

## 2022-10-15 ENCOUNTER — Encounter (HOSPITAL_COMMUNITY)
Admission: EM | Disposition: A | Discharge status: Rehabilitation Facility | Payer: Self-pay | Source: Home / Self Care | Attending: Internal Medicine

## 2022-10-15 DIAGNOSIS — M726 Necrotizing fasciitis: Secondary | ICD-10-CM

## 2022-10-15 DIAGNOSIS — I10 Essential (primary) hypertension: Secondary | ICD-10-CM

## 2022-10-15 DIAGNOSIS — Z794 Long term (current) use of insulin: Secondary | ICD-10-CM

## 2022-10-15 DIAGNOSIS — E119 Type 2 diabetes mellitus without complications: Secondary | ICD-10-CM

## 2022-10-15 HISTORY — PX: STUMP REVISION: SHX6102

## 2022-10-15 LAB — GLUCOSE, CAPILLARY
Glucose-Capillary: 147 mg/dL — ABNORMAL HIGH (ref 70–99)
Glucose-Capillary: 167 mg/dL — ABNORMAL HIGH (ref 70–99)
Glucose-Capillary: 173 mg/dL — ABNORMAL HIGH (ref 70–99)
Glucose-Capillary: 182 mg/dL — ABNORMAL HIGH (ref 70–99)
Glucose-Capillary: 166 mg/dL — ABNORMAL HIGH (ref 70–99)

## 2022-10-15 LAB — CULTURE, BLOOD (ROUTINE X 2): Culture: NO GROWTH

## 2022-10-15 LAB — AEROBIC/ANAEROBIC CULTURE W GRAM STAIN (SURGICAL/DEEP WOUND)

## 2022-10-15 SURGERY — REVISION, AMPUTATION SITE
Anesthesia: Regional | Site: Leg Lower | Laterality: Right

## 2022-10-15 MED ORDER — VITAMIN C 500 MG PO TABS: 1000.0000 mg | ORAL_TABLET | Freq: Every day | ORAL | Status: DC

## 2022-10-15 MED ORDER — PROPOFOL 500 MG/50ML IV EMUL
INTRAVENOUS | Status: DC | PRN
  Administered 2022-10-15: 50 ug/kg/min via INTRAVENOUS

## 2022-10-15 MED ORDER — VASOPRESSIN 20 UNIT/ML IV SOLN
INTRAVENOUS | Status: AC
  Filled 2022-10-15: qty 1

## 2022-10-15 MED ORDER — CHLORHEXIDINE GLUCONATE 4 % EX SOLN: 60.0000 mL | Freq: Once | CUTANEOUS | Status: DC

## 2022-10-15 MED ORDER — TRANEXAMIC ACID 1000 MG/10ML IV SOLN
2000.0000 mg | INTRAVENOUS | Status: DC
  Filled 2022-10-15: qty 20

## 2022-10-15 MED ORDER — JUVEN PO PACK: 1.0000 | PACK | Freq: Two times a day (BID) | ORAL | Status: DC

## 2022-10-15 MED ORDER — LABETALOL HCL 5 MG/ML IV SOLN: 10.0000 mg | INTRAVENOUS | Status: DC | PRN

## 2022-10-15 MED ORDER — ONDANSETRON HCL 4 MG/2ML IJ SOLN: 4.0000 mg | Freq: Four times a day (QID) | INTRAMUSCULAR | Status: DC | PRN

## 2022-10-15 MED ORDER — FENTANYL CITRATE (PF) 100 MCG/2ML IJ SOLN: 25.0000 ug | INTRAMUSCULAR | Status: DC | PRN

## 2022-10-15 MED ORDER — DOCUSATE SODIUM 100 MG PO CAPS: 100.0000 mg | ORAL_CAPSULE | Freq: Every day | ORAL | Status: DC

## 2022-10-15 MED ORDER — POVIDONE-IODINE 10 % EX SWAB: 2.0000 | Freq: Once | CUTANEOUS | Status: DC

## 2022-10-15 MED ORDER — MAGNESIUM CITRATE PO SOLN: 1.0000 | Freq: Once | ORAL | Status: DC | PRN

## 2022-10-15 MED ORDER — CHLORHEXIDINE GLUCONATE 0.12 % MT SOLN
OROMUCOSAL | Status: AC
  Filled 2022-10-15: qty 15

## 2022-10-15 MED ORDER — ROPIVACAINE HCL 5 MG/ML IJ SOLN
INTRAMUSCULAR | Status: DC | PRN
  Administered 2022-10-15: 15 mL via PERINEURAL
  Administered 2022-10-15: 30 mL via PERINEURAL

## 2022-10-15 MED ORDER — POLYETHYLENE GLYCOL 3350 17 G PO PACK: 17.0000 g | PACK | Freq: Every day | ORAL | Status: DC | PRN

## 2022-10-15 MED ORDER — PROPOFOL 10 MG/ML IV BOLUS
INTRAVENOUS | Status: AC
  Filled 2022-10-15: qty 20

## 2022-10-15 MED ORDER — MIDAZOLAM HCL 5 MG/5ML IJ SOLN
INTRAMUSCULAR | Status: DC | PRN
  Administered 2022-10-15: 1 mg via INTRAVENOUS

## 2022-10-15 MED ORDER — METOPROLOL TARTRATE 5 MG/5ML IV SOLN: 2.0000 mg | INTRAVENOUS | Status: DC | PRN

## 2022-10-15 MED ORDER — AMISULPRIDE (ANTIEMETIC) 5 MG/2ML IV SOLN: 10.0000 mg | Freq: Once | INTRAVENOUS | Status: DC | PRN

## 2022-10-15 MED ORDER — TRANEXAMIC ACID-NACL 1000-0.7 MG/100ML-% IV SOLN
1000.0000 mg | INTRAVENOUS | Status: AC
  Administered 2022-10-15: 1000 mg via INTRAVENOUS

## 2022-10-15 MED ORDER — PHENOL 1.4 % MT LIQD: 1.0000 | OROMUCOSAL | Status: DC | PRN

## 2022-10-15 MED ORDER — BISACODYL 5 MG PO TBEC: 5.0000 mg | DELAYED_RELEASE_TABLET | Freq: Every day | ORAL | Status: DC | PRN

## 2022-10-15 MED ORDER — PHENYLEPHRINE 80 MCG/ML (10ML) SYRINGE FOR IV PUSH (FOR BLOOD PRESSURE SUPPORT)
PREFILLED_SYRINGE | INTRAVENOUS | Status: DC | PRN
  Administered 2022-10-15: 80 ug via INTRAVENOUS

## 2022-10-15 MED ORDER — OXYCODONE HCL 5 MG PO TABS: 5.0000 mg | ORAL_TABLET | Freq: Once | ORAL | Status: DC | PRN

## 2022-10-15 MED ORDER — CHLORHEXIDINE GLUCONATE 0.12 % MT SOLN: 15.0000 mL | Freq: Once | OROMUCOSAL | Status: DC

## 2022-10-15 MED ORDER — ORAL CARE MOUTH RINSE: 15.0000 mL | Freq: Once | OROMUCOSAL | Status: DC

## 2022-10-15 MED ORDER — HYDRALAZINE HCL 20 MG/ML IJ SOLN: 5.0000 mg | INTRAMUSCULAR | Status: DC | PRN

## 2022-10-15 MED ORDER — ZINC SULFATE 220 (50 ZN) MG PO CAPS
220.0000 mg | ORAL_CAPSULE | Freq: Every day | ORAL | Status: DC
Start: 2022-10-15 — End: 2022-10-15

## 2022-10-15 MED ORDER — FENTANYL CITRATE (PF) 250 MCG/5ML IJ SOLN
INTRAMUSCULAR | Status: AC
  Filled 2022-10-15: qty 5

## 2022-10-15 MED ORDER — LACTATED RINGERS IV SOLN: INTRAVENOUS | Status: DC

## 2022-10-15 MED ORDER — MAGNESIUM SULFATE 2 GM/50ML IV SOLN: 2.0000 g | Freq: Every day | INTRAVENOUS | Status: DC | PRN

## 2022-10-15 MED ORDER — HYDROMORPHONE HCL 1 MG/ML IJ SOLN: 0.2500 mg | INTRAMUSCULAR | Status: DC | PRN

## 2022-10-15 MED ORDER — TRANEXAMIC ACID-NACL 1000-0.7 MG/100ML-% IV SOLN
INTRAVENOUS | Status: AC
  Filled 2022-10-15: qty 100

## 2022-10-15 MED ORDER — GUAIFENESIN-DM 100-10 MG/5ML PO SYRP: 15.0000 mL | ORAL_SOLUTION | ORAL | Status: DC | PRN

## 2022-10-15 MED ORDER — CEFAZOLIN SODIUM-DEXTROSE 2-4 GM/100ML-% IV SOLN
2.0000 g | INTRAVENOUS | Status: AC
  Administered 2022-10-15: 2 g via INTRAVENOUS

## 2022-10-15 MED ORDER — CLONIDINE HCL (ANALGESIA) 100 MCG/ML EP SOLN
EPIDURAL | Status: DC | PRN
  Administered 2022-10-15: 50 ug
  Administered 2022-10-15: 100 ug

## 2022-10-15 MED ORDER — ALUM & MAG HYDROXIDE-SIMETH 200-200-20 MG/5ML PO SUSP
15.0000 mL | ORAL | Status: DC | PRN
  Filled 2022-10-15: qty 30

## 2022-10-15 MED ORDER — LIDOCAINE 2% (20 MG/ML) 5 ML SYRINGE
INTRAMUSCULAR | Status: DC | PRN
  Administered 2022-10-15: 20 mg via INTRAVENOUS

## 2022-10-15 MED ORDER — ACETAMINOPHEN 10 MG/ML IV SOLN: 1000.0000 mg | Freq: Once | INTRAVENOUS | Status: DC | PRN

## 2022-10-15 MED ORDER — SODIUM CHLORIDE 0.9 % IV SOLN: INTRAVENOUS | Status: DC

## 2022-10-15 MED ORDER — PANTOPRAZOLE SODIUM 40 MG PO TBEC: 40.0000 mg | DELAYED_RELEASE_TABLET | Freq: Every day | ORAL | Status: DC

## 2022-10-15 MED ORDER — CEFAZOLIN SODIUM-DEXTROSE 2-4 GM/100ML-% IV SOLN
INTRAVENOUS | Status: AC
  Filled 2022-10-15: qty 100

## 2022-10-15 MED ORDER — MIDAZOLAM HCL 2 MG/2ML IJ SOLN
INTRAMUSCULAR | Status: AC
  Filled 2022-10-15: qty 2

## 2022-10-15 MED ORDER — POTASSIUM CHLORIDE CRYS ER 20 MEQ PO TBCR: 20.0000 meq | EXTENDED_RELEASE_TABLET | Freq: Every day | ORAL | Status: DC | PRN

## 2022-10-15 MED ORDER — FENTANYL CITRATE (PF) 250 MCG/5ML IJ SOLN
INTRAMUSCULAR | Status: DC | PRN
  Administered 2022-10-15: 50 ug via INTRAVENOUS

## 2022-10-15 MED ORDER — ONDANSETRON HCL 4 MG/2ML IJ SOLN: 4.0000 mg | Freq: Once | INTRAMUSCULAR | Status: DC | PRN

## 2022-10-15 MED ORDER — 0.9 % SODIUM CHLORIDE (POUR BTL) OPTIME
TOPICAL | Status: DC | PRN
  Administered 2022-10-15: 1000 mL

## 2022-10-15 MED ORDER — OXYCODONE HCL 5 MG/5ML PO SOLN: 5.0000 mg | Freq: Once | ORAL | Status: DC | PRN

## 2022-10-15 SURGICAL SUPPLY — 33 items
BAG COUNTER SPONGE SURGICOUNT (BAG) ×2 IMPLANT
BAG SPNG CNTER NS LX DISP (BAG) ×1
BLADE SAW RECIP 87.9 MT (BLADE) IMPLANT
BLADE SURG 21 STRL SS (BLADE) ×2 IMPLANT
CANISTER WOUND CARE 500ML ATS (WOUND CARE) ×2 IMPLANT
COVER SURGICAL LIGHT HANDLE (MISCELLANEOUS) ×2 IMPLANT
DRAPE DERMATAC (DRAPES) IMPLANT
DRAPE EXTREMITY T 121X128X90 (DISPOSABLE) ×2 IMPLANT
DRAPE HALF SHEET 40X57 (DRAPES) ×2 IMPLANT
DRAPE INCISE IOBAN 66X45 STRL (DRAPES) ×2 IMPLANT
DRAPE U-SHAPE 47X51 STRL (DRAPES) ×4 IMPLANT
DRESSING PREVENA PLUS CUSTOM (GAUZE/BANDAGES/DRESSINGS) ×2 IMPLANT
DRSG PREVENA PLUS CUSTOM (GAUZE/BANDAGES/DRESSINGS) ×1
DURAPREP 26ML APPLICATOR (WOUND CARE) ×2 IMPLANT
ELECT REM PT RETURN 9FT ADLT (ELECTROSURGICAL) ×1
ELECTRODE REM PT RTRN 9FT ADLT (ELECTROSURGICAL) ×2 IMPLANT
GLOVE BIOGEL PI IND STRL 9 (GLOVE) ×2 IMPLANT
GLOVE SURG ORTHO 9.0 STRL STRW (GLOVE) ×2 IMPLANT
GOWN STRL REUS W/ TWL XL LVL3 (GOWN DISPOSABLE) ×4 IMPLANT
GOWN STRL REUS W/TWL XL LVL3 (GOWN DISPOSABLE) ×2
GRAFT SKIN WND MICRO 38 (Tissue) IMPLANT
KIT BASIN OR (CUSTOM PROCEDURE TRAY) ×2 IMPLANT
KIT TURNOVER KIT B (KITS) ×2 IMPLANT
MANIFOLD NEPTUNE II (INSTRUMENTS) ×2 IMPLANT
NS IRRIG 1000ML POUR BTL (IV SOLUTION) ×2 IMPLANT
PACK GENERAL/GYN (CUSTOM PROCEDURE TRAY) ×2 IMPLANT
PAD ARMBOARD 7.5X6 YLW CONV (MISCELLANEOUS) ×2 IMPLANT
PREVENA RESTOR ARTHOFORM 46X30 (CANNISTER) ×2 IMPLANT
STAPLER VISISTAT 35W (STAPLE) IMPLANT
SUT ETHILON 2 0 PSLX (SUTURE) ×4 IMPLANT
SUT SILK 2 0 (SUTURE)
SUT SILK 2-0 18XBRD TIE 12 (SUTURE) IMPLANT
TOWEL GREEN STERILE (TOWEL DISPOSABLE) ×2 IMPLANT

## 2022-10-15 NOTE — Anesthesia Procedure Notes (Signed)
Anesthesia Regional Block: Adductor canal block   Pre-Anesthetic Checklist: , timeout performed,  Correct Patient, Correct Site, Correct Laterality,  Correct Procedure, Correct Position, site marked,  Risks and benefits discussed,  Surgical consent,  Pre-op evaluation,  At surgeon's request and post-op pain management  Laterality: Lower and Right  Prep: chloraprep       Needles:  Injection technique: Single-shot  Needle Type: Echogenic Needle     Needle Length: 9cm  Needle Gauge: 22     Additional Needles:   Procedures:,,,, ultrasound used (permanent image in chart),,    Narrative:  Start time: 10/15/2022 8:13 AM End time: 10/15/2022 8:18 AM Injection made incrementally with aspirations every 5 mL.  Performed by: Personally  Anesthesiologist: Trevor Iha, MD  Additional Notes: Block assessed prior to surgery. Pt tolerated procedure well.

## 2022-10-15 NOTE — Anesthesia Postprocedure Evaluation (Signed)
Anesthesia Post Note  Patient: Engineer, mining  Procedure(s) Performed: REVISION RIGHT BELOW KNEE AMPUTATION (Right: Leg Lower)     Patient location during evaluation: PACU Anesthesia Type: Regional Level of consciousness: awake and alert Pain management: pain level controlled Vital Signs Assessment: post-procedure vital signs reviewed and stable Respiratory status: spontaneous breathing, nonlabored ventilation, respiratory function stable and patient connected to nasal cannula oxygen Cardiovascular status: stable and blood pressure returned to baseline Postop Assessment: no apparent nausea or vomiting Anesthetic complications: no  No notable events documented.  Last Vitals:  Vitals:   10/15/22 0945 10/15/22 1008  BP: (!) 148/85 138/84  Pulse: 92 93  Resp: 15   Temp: 36.8 C 36.9 C  SpO2: 100% 100%    Last Pain:  Vitals:   10/15/22 1008  TempSrc: Oral  PainSc:                  Trevor Iha

## 2022-10-15 NOTE — Transfer of Care (Signed)
Immediate Anesthesia Transfer of Care Note  Patient: Carlos [name]  Procedure(s) Performed: REVISION RIGHT BELOW KNEE AMPUTATION (Right: Leg Lower)  Patient Location: PACU  Anesthesia Type:MAC and Regional  Level of Consciousness: awake, alert , and oriented  Airway & Oxygen Therapy: Patient Spontanous Breathing and Patient connected to nasal cannula oxygen  Post-op Assessment: Report given to RN and Post -op Vital signs reviewed and stable  Post vital signs: Reviewed and stable  Last Vitals:  Vitals Value Taken Time  BP 136/81 10/15/22 0920  Temp    Pulse 90 10/15/22 0922  Resp 12 10/15/22 0922  SpO2 99 % 10/15/22 0922  Vitals shown include unvalidated device data.  Last Pain:  Vitals:   10/15/22 0733  TempSrc: Oral  PainSc:          Complications: No notable events documented.

## 2022-10-15 NOTE — Consult Note (Signed)
WOC Nurse Consult Note:  Reason for Consult: R forearm wound  Wound type: partial thickness unknown etiology, per patient states this is where an "IV went bad " Pressure Injury POA: NA  Measurement: total area 4 cm x 7 cm superior anterior forearm (near antecubital area) partial thickness skin loss.  Has an area 5 cm x 2.5 cms distal (near wrist) partial thickness skin loss.  Patient states these areas were blisters and he "popped " them  Wound bed: 100% pink and moist  Drainage (amount, consistency, odor)  no drainage seen at this visit  Periwound: several small areas of lifting of epidermis but not actually open Dressing procedure/placement/frequency: Clean R anterior forearm with NS, apply single layer Xeroform gauze [place] [id number]) from antecubital to wrist, secure with Kerlix roll gauze and tape. May change every other day.    Discussed POC with patient and bedside nurse.    WOC team will not follow.  Please re-consult if further needs arise.   Thank you,    Priscella Mann MSN, RN-BC, CWON [phone] :

## 2022-10-15 NOTE — Progress Notes (Signed)
Inpatient Rehab Admissions Coordinator:    CIR following. Pt. To OR today for R BKA revision. Not yet ready for this time. Will see how he does with therapies post op and follow up for potential admit if no further surgical intervention is needed.  Megan Salon, MS, CCC-SLP Rehab Admissions Coordinator  [phone] (celll) [phone] (office)

## 2022-10-15 NOTE — Progress Notes (Signed)
PROGRESS NOTE  Carlos [name]  DOB: 1976-04-04  PCP: System, Provider Not In ZOX:[id number]  DOA: 10/08/2022  LOS: 7 days  Hospital Day: 8  Brief narrative: Carlos [name] is a 47 y.o. male with PMH significant for untreated DM2, gastroparesis, CVA, MRSA cellulitis 2021 5/3, patient presented to the ED with complaint of progressive swelling and blackening of right big toe for 3 weeks 2  weeks ago, hewas seen at urgent care center and was prescribed a course of doxycycline which hecould not tolerate because of nausea and vomiting. His wound continued to grow. He also started having fever, chills, and hence came to the ED.  In the ED, patient had a fever of 102.6, heart rate 126, blood pressure in 90s, breathing on room air She was noted to have edematous right great toe with weeping, foul order and eschar Initial labs with WBC count 27.7, lactic acid 3.3>7.4>2.5 CT right foot with contrast showed extensive subcutaneous and intramuscular gas within the intrinsic musculature of the right foot as well as the terminal visualized right lower extremity musculature, findings suggestive of necrotizing fasciitis.  Acute osteomyelitis of first metatarsal head.  Patient was given IV fluid bolus, broad-spectrum IV antibiotics Admitted to [facility] Orthopedics was consulted. Blood culture obtained on admission grew Streptococcus.  Patient underwent right BKA by Dr. Lajoyce Corners with revision twice so far, last one was today 5/10.  TEE negative for vegetation. On IV antibiotics per ID He has been intermittently resistant and unwilling to commit to IV antibiotics and insulin.  Subjective: Patient was seen and examined this morning. Lying down on bed.  Not in distress. Agreed to be on IV Unasyn this morning.  Assessment and plan: Severe sepsis - POA Necrotizing fasciitis of right lower extremity S/p right BKA -5/5 Dr. Lajoyce Corners; revision 5/8 & 5/10. Streptococcal bacteremia Presented with worsening right foot wound.  Imaging as above showing gas gangrene as well as necrotizing fasciitis Procedures as above. Blood culture sent on admission as well as wound culture grew Streptococcus.  TTE/TEE negative for vegetation.   WBC count improving.  Lactate level improved. Patient has been counseled by multiple providers to comply with IV antibiotics. Recent Labs  Lab 10/08/22 1945 10/09/22 0118 10/09/22 0430 10/09/22 0803 10/10/22 0150 10/11/22 0049 10/12/22 0222 10/14/22 0153  WBC  --   --   --  27.7* 28.7* 25.3* 20.2* 14.0*  LATICACIDVEN 7.4* 2.5* 2.7* 3.1* 1.8  --   --   --     AKI Lactic acidosis Due to tissue hypoperfusion and renal hypoperfusion. Levels improved. Recent Labs    10/08/22 1719 10/08/22 2109 10/09/22 0430 10/09/22 0803 10/10/22 0150 10/11/22 0049 10/12/22 0222 10/13/22 0202 10/14/22 0153  BUN 19 23* 23* 19 16 21* 16 10 7   CREATININE 2.05* 1.88* 1.57* 1.43* 1.28* 1.14 0.99 0.79 0.67     Acute hyponatremia baseline sodium level was normal.  Presented with low level of 123.   Serum osmolality is normal but urine osmolality is elevated.   Gradually improving. Recent Labs  Lab 10/08/22 1719 10/08/22 2109 10/09/22 0430 10/09/22 0803 10/10/22 0150 10/11/22 0049 10/12/22 0222 10/13/22 0202 10/14/22 0153  NA 123* 123* 125* 129* 126* 127* 128* 132* 133*    Elevated troponin Demand ischemia likely secondary to sepsis/infection.  No active anginal symptoms. However, given his significant cardiac risk factors, he may have underlying CAD as well. Follow-up with cardiology as an outpatient for risk stratification.  Type 2 diabetes mellitus uncontrolled with hyperglycemia A1c 10.4 on 10/08/2022.  Presented with a blood sugar level elevated over 500 PTA not on any meds. Currently on Semglee 30 units nightly along with moderate SSI/Accu-Cheks. Encouraged compliance to insulin. Recent Labs  Lab 10/14/22 1612 10/14/22 1954 10/15/22 0721 10/15/22 0921 10/15/22 1125   GLUCAP 170* 158* 147* 167* 173*    Diabetic gastroparesis PTA not on any meds.   Patient is complaining of nausea with anything he eats.  I offered him IV Reglan but he states it did not work for him in the past and he does not want to try it again.   H/o CVA Not on any meds    Mobility: PT eval obtained.  CIR versus SNF  Goals of care   Code Status: Full Code    DVT prophylaxis:  SCD's Start: 10/15/22 1019 SCD's Start: 10/13/22 1248 SCD's Start: 10/10/22 1342 heparin injection 5,000 Units Start: 10/09/22 2200   Antimicrobials: IV Unasyn per ID Fluid: Not on IV Consultants: Orthopedics Family Communication: None at bedside  Status: Inpatient Level of care:  Telemetry Medical   Patient from: Home Anticipated d/c to: Pending clinical course Needs to continue in-hospital care:  Underwent revision BKA today.  Has wound VAC in place.   Diet:  Diet Order             Diet Carb Modified Fluid consistency: Thin; Room service appropriate? Yes  Diet effective now                   Scheduled Meds:  (feeding supplement) PROSource Plus  30 mL Oral TID BM   sodium chloride   Intravenous Once   vitamin C  1,000 mg Oral Daily   Chlorhexidine Gluconate Cloth  6 each Topical Daily   docusate sodium  100 mg Oral Daily   heparin  5,000 Units Subcutaneous Q8H   insulin aspart  0-15 Units Subcutaneous TID WC   insulin aspart  0-5 Units Subcutaneous QHS   insulin glargine-yfgn  30 Units Subcutaneous QHS   insulin starter kit- pen needles  1 kit Other Once   living well with diabetes book   Does not apply Once   mupirocin ointment  1 Application Nasal BID   nutrition supplement (JUVEN)  1 packet Oral BID BM   pantoprazole  40 mg Oral Daily   zinc sulfate  220 mg Oral Daily    PRN meds: acetaminophen, alum & mag hydroxide-simeth, bisacodyl, guaiFENesin-dextromethorphan, hydrALAZINE, HYDROmorphone (DILAUDID) injection, labetalol, magnesium citrate, magnesium sulfate bolus  IVPB, metoprolol tartrate, morphine injection, ondansetron, oxyCODONE, oxyCODONE, phenol, polyethylene glycol, potassium chloride, senna-docusate   Infusions:   sodium chloride 75 mL/hr at 10/15/22 1041   ampicillin-sulbactam (UNASYN) IV 3 g (10/15/22 1045)   magnesium sulfate bolus IVPB      Antimicrobials: Anti-infectives (From admission, onward)    Start     Dose/Rate Route Frequency Ordered Stop   10/15/22 0745  ceFAZolin (ANCEF) IVPB 2g/100 mL premix        2 g 200 mL/hr over 30 Minutes Intravenous On call to O.R. 10/15/22 0734 10/15/22 0907   10/15/22 0711  ceFAZolin (ANCEF) 2-4 GM/100ML-% IVPB       Note to Pharmacy: Jamelle Rushing, GRETA: cabinet override      10/15/22 0711 10/15/22 0837   10/14/22 1600  Ampicillin-Sulbactam (UNASYN) 3 g in sodium chloride 0.9 % 100 mL IVPB        3 g 200 mL/hr over 30 Minutes Intravenous Every 6 hours 10/14/22 1008     10/13/22 0600  ceFAZolin (ANCEF) IVPB 2g/100 mL premix        2 g 200 mL/hr over 30 Minutes Intravenous On call to O.R. 10/13/22 0207 10/13/22 1124   10/11/22 2100  metroNIDAZOLE (FLAGYL) tablet 500 mg  Status:  Discontinued        500 mg Oral Every 12 hours 10/11/22 1121 10/11/22 1856   10/11/22 2000  metroNIDAZOLE (FLAGYL) IVPB 500 mg  Status:  Discontinued        500 mg 100 mL/hr over 60 Minutes Intravenous 2 times daily 10/11/22 1856 10/14/22 1008   10/11/22 1600  cefTRIAXone (ROCEPHIN) 2 g in sodium chloride 0.9 % 100 mL IVPB  Status:  Discontinued        2 g 200 mL/hr over 30 Minutes Intravenous Every 24 hours 10/11/22 0958 10/14/22 1008   10/10/22 1345  ceFAZolin (ANCEF) IVPB 2g/100 mL premix  Status:  Discontinued        2 g 200 mL/hr over 30 Minutes Intravenous Every 8 hours 10/10/22 1341 10/10/22 1528   10/10/22 1045  ceFAZolin (ANCEF) IVPB 2g/100 mL premix  Status:  Discontinued        2 g 200 mL/hr over 30 Minutes Intravenous On call to O.R. 10/10/22 0947 10/10/22 1013   10/10/22 0955  metroNIDAZOLE (FLAGYL) 500  MG/100ML IVPB       Note to Pharmacy: Launa Flight M: cabinet override      10/10/22 0955 10/10/22 1115   10/09/22 2100  vancomycin (VANCOREADY) IVPB 1250 mg/250 mL  Status:  Discontinued        1,250 mg 166.7 mL/hr over 90 Minutes Intravenous Every 24 hours 10/09/22 1604 10/11/22 0957   10/09/22 1800  ceFEPIme (MAXIPIME) 2 g in sodium chloride 0.9 % 100 mL IVPB  Status:  Discontinued        2 g 200 mL/hr over 30 Minutes Intravenous Every 8 hours 10/09/22 1616 10/11/22 0957   10/09/22 0600  ceFEPIme (MAXIPIME) 2 g in sodium chloride 0.9 % 100 mL IVPB  Status:  Discontinued        2 g 200 mL/hr over 30 Minutes Intravenous Every 12 hours 10/08/22 2103 10/09/22 1616   10/08/22 2103  vancomycin variable dose per unstable renal function (pharmacist dosing)  Status:  Discontinued         Does not apply See admin instructions 10/08/22 2103 10/09/22 1604   10/08/22 2015  vancomycin (VANCOREADY) IVPB 2000 mg/400 mL        2,000 mg 200 mL/hr over 120 Minutes Intravenous  Once 10/08/22 2005 10/08/22 2250   10/08/22 1800  ceFEPIme (MAXIPIME) 2 g in sodium chloride 0.9 % 100 mL IVPB        2 g 200 mL/hr over 30 Minutes Intravenous  Once 10/08/22 1748 10/08/22 1916   10/08/22 1745  metroNIDAZOLE (FLAGYL) IVPB 500 mg  Status:  Discontinued        500 mg 100 mL/hr over 60 Minutes Intravenous Every 12 hours 10/08/22 1736 10/11/22 1121       Nutritional status:  Body mass index is 31.91 kg/m.  Nutrition Problem: Increased nutrient needs Etiology: wound healing, post-op healing Signs/Symptoms: estimated needs     Objective: Vitals:   10/15/22 0945 10/15/22 1008  BP: (!) 148/85 138/84  Pulse: 92 93  Resp: 15   Temp: 98.2 F (36.8 C) 98.4 F (36.9 C)  SpO2: 100% 100%    Intake/Output Summary (Last 24 hours) at 10/15/2022 1308 Last data filed at 10/15/2022 1047 Gross per  24 hour  Intake 820 ml  Output 1225 ml  Net -405 ml    Filed Weights   10/08/22 1708 10/08/22 1947 10/15/22 0733   Weight: 97.5 kg 97.5 kg 98 kg   Weight change:  Body mass index is 31.91 kg/m.   Physical Exam: General exam: Middle-aged African-American male. Looks older for his stated age Skin: No rashes, lesions or ulcers. HEENT: Atraumatic, normocephalic, no obvious bleeding Lungs: Clear to auscultation bilaterally CVS: Regular rate and rhythm, no murmur GI/Abd soft, nontender, nondistended, bowel sound present CNS: Alert, awake, oriented x 3 Psychiatry: Mood appropriate Extremities: Right leg new BKA status.  Wound VAC in place.  Not in distress  Data Review: I have personally reviewed the laboratory data and studies available.  F/u labs ordered Unresulted Labs (From admission, onward)     Start     Ordered   10/16/22 0500  Basic metabolic panel  Tomorrow morning,   R       Question:  Specimen collection method  Answer:  Lab=Lab collect   10/15/22 1047   10/16/22 0500  CBC with Differential/Platelet  Tomorrow morning,   R       Question:  Specimen collection method  Answer:  Lab=Lab collect   10/15/22 1047            Total time spent in review of labs and imaging, patient evaluation, formulation of plan, documentation and communication with family: 45 minutes  Signed, Lorin Glass, MD [facility] 10/15/2022

## 2022-10-15 NOTE — Interval H&P Note (Signed)
History and Physical Interval Note:  10/15/2022 6:30 AM  Carlos [name]  has presented today for surgery, with the diagnosis of Necrotizing Fascitis Right Leg.  The various methods of treatment have been discussed with the patient and family. After consideration of risks, benefits and other options for treatment, the patient has consented to  Procedure(s): REVISION RIGHT BELOW KNEE AMPUTATION (Right) as a surgical intervention.  The patient's history has been reviewed, patient examined, no change in status, stable for surgery.  I have reviewed the patient's chart and labs.  Questions were answered to the patient's satisfaction.     [name]

## 2022-10-15 NOTE — Anesthesia Procedure Notes (Signed)
Procedure Name: MAC Date/Time: 10/15/2022 8:33 AM  Performed by: Marena Chancy, CRNAPre-anesthesia Checklist: Patient identified, Emergency Drugs available, Suction available, Patient being monitored and Timeout performed Patient Re-evaluated:Patient Re-evaluated prior to induction Oxygen Delivery Method: Simple face mask

## 2022-10-15 NOTE — Op Note (Signed)
10/15/2022  9:26 AM  PATIENT:  Carlos [name]    PRE-OPERATIVE DIAGNOSIS:  Necrotizing Fascitis Right Leg  POST-OPERATIVE DIAGNOSIS:  Same  PROCEDURE:  REVISION RIGHT BELOW KNEE AMPUTATION  Application Kerecis micro graft 38 cm. Application of new wound VAC sponge size large.  SURGEON:  [name], MD  PHYSICIAN ASSISTANT:None ANESTHESIA:   General  PREOPERATIVE INDICATIONS:  Carlos [name] is a  47 y.o. male with a diagnosis of Necrotizing Fascitis Right Leg who failed conservative measures and elected for surgical management.    The risks benefits and alternatives were discussed with the patient preoperatively including but not limited to the risks of infection, bleeding, nerve injury, cardiopulmonary complications, the need for revision surgery, among others, and the patient was willing to proceed.  OPERATIVE IMPLANTS:   Implant Name Type Inv. Item Serial No. Manufacturer Lot No. LRB No. Used Action  GRAFT SKIN WND MICRO 38 - [id number] Tissue GRAFT SKIN WND MICRO 38  [employer] [id number] Right 1 Implanted    @ENCIMAGES @  OPERATIVE FINDINGS: Tissue margins were clear good contractile muscle.  OPERATIVE PROCEDURE: Patient was brought the operating room underwent a general anesthetic.  After adequate levels anesthesia were obtained patient's right lower extremities prepped using DuraPrep draped into a sterile field a timeout was called.  Approximately 1 cm of muscle and 1 cm bone was resected.  The margins with healthy viable good bleeding.  This was touched was electrocautery for hemostasis.  The wound bed was irrigated with normal saline and 38 cm of Kerecis micro graft was applied.  Local tissue rearrangement for was used for wound closure and a new wound VAC sponge was applied this had a good suction fit.  Patient was extubated taken the PACU in stable condition.      Follow-up: In the office 1 week post operative.

## 2022-10-15 NOTE — Anesthesia Procedure Notes (Signed)
Anesthesia Regional Block: Popliteal block   Pre-Anesthetic Checklist: , timeout performed,  Correct Patient, Correct Site, Correct Laterality,  Correct Procedure, Correct Position, site marked,  Risks and benefits discussed,  Pre-op evaluation,  At surgeon's request and post-op pain management  Laterality: Lower and Right  Prep: Maximum Sterile Barrier Precautions used, chloraprep       Needles:  Injection technique: Single-shot  Needle Type: Echogenic Needle     Needle Length: 9cm  Needle Gauge: 21     Additional Needles:   Procedures:,,,, ultrasound used (permanent image in chart),,    Narrative:  Start time: 10/15/2022 8:04 AM End time: 10/15/2022 8:11 AM Injection made incrementally with aspirations every 5 mL.  Performed by: Personally  Anesthesiologist: Trevor Iha, MD  Additional Notes: Block assessed. Patient tolerated procedure well.

## 2022-10-15 NOTE — Progress Notes (Signed)
Patient refuses all antibiotics, VTE, and insulin therapies; provider made aware. Dressing changed on right anterior forearm redness with peeling skin (xeroform, gauze, and kerlix), added to wound flowsheet and wound consult placed.

## 2022-10-15 NOTE — Progress Notes (Signed)
Subjective:  Patient is feeling a bit better  Antibiotics:  Anti-infectives (From admission, onward)    Start     Dose/Rate Route Frequency Ordered Stop   10/15/22 0745  ceFAZolin (ANCEF) IVPB 2g/100 mL premix        2 g 200 mL/hr over 30 Minutes Intravenous On call to O.R. 10/15/22 0734 10/15/22 0907   10/15/22 0711  ceFAZolin (ANCEF) 2-4 GM/100ML-% IVPB       Note to Pharmacy: Jamelle Rushing, GRETA: cabinet override      10/15/22 0711 10/15/22 0837   10/14/22 1600  Ampicillin-Sulbactam (UNASYN) 3 g in sodium chloride 0.9 % 100 mL IVPB        3 g 200 mL/hr over 30 Minutes Intravenous Every 6 hours 10/14/22 1008     10/13/22 0600  ceFAZolin (ANCEF) IVPB 2g/100 mL premix        2 g 200 mL/hr over 30 Minutes Intravenous On call to O.R. 10/13/22 0207 10/13/22 1124   10/11/22 2100  metroNIDAZOLE (FLAGYL) tablet 500 mg  Status:  Discontinued        500 mg Oral Every 12 hours 10/11/22 1121 10/11/22 1856   10/11/22 2000  metroNIDAZOLE (FLAGYL) IVPB 500 mg  Status:  Discontinued        500 mg 100 mL/hr over 60 Minutes Intravenous 2 times daily 10/11/22 1856 10/14/22 1008   10/11/22 1600  cefTRIAXone (ROCEPHIN) 2 g in sodium chloride 0.9 % 100 mL IVPB  Status:  Discontinued        2 g 200 mL/hr over 30 Minutes Intravenous Every 24 hours 10/11/22 0958 10/14/22 1008   10/10/22 1345  ceFAZolin (ANCEF) IVPB 2g/100 mL premix  Status:  Discontinued        2 g 200 mL/hr over 30 Minutes Intravenous Every 8 hours 10/10/22 1341 10/10/22 1528   10/10/22 1045  ceFAZolin (ANCEF) IVPB 2g/100 mL premix  Status:  Discontinued        2 g 200 mL/hr over 30 Minutes Intravenous On call to O.R. 10/10/22 0947 10/10/22 1013   10/10/22 0955  metroNIDAZOLE (FLAGYL) 500 MG/100ML IVPB       Note to Pharmacy: Launa Flight M: cabinet override      10/10/22 0955 10/10/22 1115   10/09/22 2100  vancomycin (VANCOREADY) IVPB 1250 mg/250 mL  Status:  Discontinued        1,250 mg 166.7 mL/hr over 90 Minutes  Intravenous Every 24 hours 10/09/22 1604 10/11/22 0957   10/09/22 1800  ceFEPIme (MAXIPIME) 2 g in sodium chloride 0.9 % 100 mL IVPB  Status:  Discontinued        2 g 200 mL/hr over 30 Minutes Intravenous Every 8 hours 10/09/22 1616 10/11/22 0957   10/09/22 0600  ceFEPIme (MAXIPIME) 2 g in sodium chloride 0.9 % 100 mL IVPB  Status:  Discontinued        2 g 200 mL/hr over 30 Minutes Intravenous Every 12 hours 10/08/22 2103 10/09/22 1616   10/08/22 2103  vancomycin variable dose per unstable renal function (pharmacist dosing)  Status:  Discontinued         Does not apply See admin instructions 10/08/22 2103 10/09/22 1604   10/08/22 2015  vancomycin (VANCOREADY) IVPB 2000 mg/400 mL        2,000 mg 200 mL/hr over 120 Minutes Intravenous  Once 10/08/22 2005 10/08/22 2250   10/08/22 1800  ceFEPIme (MAXIPIME) 2 g in sodium chloride 0.9 % 100 mL IVPB  2 g 200 mL/hr over 30 Minutes Intravenous  Once 10/08/22 1748 10/08/22 1916   10/08/22 1745  metroNIDAZOLE (FLAGYL) IVPB 500 mg  Status:  Discontinued        500 mg 100 mL/hr over 60 Minutes Intravenous Every 12 hours 10/08/22 1736 10/11/22 1121       Medications: Scheduled Meds:  (feeding supplement) PROSource Plus  30 mL Oral TID BM   sodium chloride   Intravenous Once   vitamin C  1,000 mg Oral Daily   Chlorhexidine Gluconate Cloth  6 each Topical Daily   docusate sodium  100 mg Oral Daily   heparin  5,000 Units Subcutaneous Q8H   insulin aspart  0-15 Units Subcutaneous TID WC   insulin aspart  0-5 Units Subcutaneous QHS   insulin glargine-yfgn  30 Units Subcutaneous QHS   insulin starter kit- pen needles  1 kit Other Once   living well with diabetes book   Does not apply Once   mupirocin ointment  1 Application Nasal BID   nutrition supplement (JUVEN)  1 packet Oral BID BM   pantoprazole  40 mg Oral Daily   zinc sulfate  220 mg Oral Daily   Continuous Infusions:  sodium chloride 75 mL/hr at 10/15/22 1041    ampicillin-sulbactam (UNASYN) IV 3 g (10/15/22 1045)   magnesium sulfate bolus IVPB     PRN Meds:.acetaminophen, alum & mag hydroxide-simeth, bisacodyl, guaiFENesin-dextromethorphan, hydrALAZINE, HYDROmorphone (DILAUDID) injection, labetalol, magnesium citrate, magnesium sulfate bolus IVPB, metoprolol tartrate, morphine injection, ondansetron, oxyCODONE, oxyCODONE, phenol, polyethylene glycol, potassium chloride, senna-docusate    Objective: Weight change:   Intake/Output Summary (Last 24 hours) at 10/15/2022 1215 Last data filed at 10/15/2022 1047 Gross per 24 hour  Intake 820 ml  Output 1225 ml  Net -405 ml    Blood pressure 138/84, pulse 93, temperature 98.4 F (36.9 C), temperature source Oral, resp. rate 15, height 5\' 9"  (1.753 m), weight 98 kg, SpO2 100 %. Temp:  [97.7 F (36.5 C)-98.5 F (36.9 C)] 98.4 F (36.9 C) (05/10 1008) Pulse Rate:  [89-98] 93 (05/10 1008) Resp:  [12-20] 15 (05/10 0945) BP: (119-163)/(73-91) 138/84 (05/10 1008) SpO2:  [97 %-100 %] 100 % (05/10 1008) Weight:  [98 kg] 98 kg (05/10 0733)  Physical Exam: Physical Exam Constitutional:      Appearance: He is well-developed.  HENT:     Head: Normocephalic and atraumatic.  Eyes:     Conjunctiva/sclera: Conjunctivae normal.  Cardiovascular:     Rate and Rhythm: Normal rate and regular rhythm.  Pulmonary:     Effort: Pulmonary effort is normal. No respiratory distress.     Breath sounds: No wheezing.  Abdominal:     General: There is no distension.     Palpations: Abdomen is soft.  Musculoskeletal:        General: Normal range of motion.     Cervical back: Normal range of motion and neck supple.  Skin:    General: Skin is warm and dry.     Findings: No erythema or rash.  Neurological:     General: No focal deficit present.     Mental Status: He is alert and oriented to person, place, and time.  Psychiatric:        Mood and Affect: Mood normal.        Behavior: Behavior normal.         Thought Content: Thought content normal.        Judgment: Judgment normal.  CBC:    BMET Recent Labs    10/13/22 0202 10/14/22 0153  NA 132* 133*  K 4.0 3.8  CL 99 102  CO2 24 24  GLUCOSE 160* 119*  BUN 10 7  CREATININE 0.79 0.67  CALCIUM 7.4* 7.3*      Liver Panel  No results for input(s): "PROT", "ALBUMIN", "AST", "ALT", "ALKPHOS", "BILITOT", "BILIDIR", "IBILI" in the last 72 hours.     Sedimentation Rate No results for input(s): "ESRSEDRATE" in the last 72 hours. C-Reactive Protein No results for input(s): "CRP" in the last 72 hours.  Micro Results: Recent Results (from the past 720 hour(s))  Culture, blood (Routine x 2)     Status: Abnormal   Collection Time: 10/08/22  5:15 PM   Specimen: BLOOD  Result Value Ref Range Status   Specimen Description BLOOD RIGHT FOOT  Final   Special Requests   Final    BOTTLES DRAWN AEROBIC AND ANAEROBIC Blood Culture results may not be optimal due to an inadequate volume of blood received in culture bottles   Culture  Setup Time   Final    GRAM POSITIVE COCCI IN BOTH AEROBIC AND ANAEROBIC BOTTLES CRITICAL VALUE NOTED.  VALUE IS CONSISTENT WITH PREVIOUSLY REPORTED AND CALLED VALUE.    Culture (A)  Final    STREPTOCOCCUS CONSTELLATUS CRITICAL RESULT CALLED TO, READ BACK BY AND VERIFIED WITH: PHARMD AUSTIN P. Performed at [facility], [address] [place], [place] [address]    Report Status 10/13/2022 FINAL  Final   Organism ID, Bacteria STREPTOCOCCUS CONSTELLATUS  Final      Susceptibility   Streptococcus constellatus - MIC*    PENICILLIN <=0.06 SENSITIVE Sensitive     CEFTRIAXONE <=0.12 SENSITIVE Sensitive     ERYTHROMYCIN <=0.12 SENSITIVE Sensitive     LEVOFLOXACIN <=0.25 SENSITIVE Sensitive     VANCOMYCIN 0.25 SENSITIVE Sensitive     * STREPTOCOCCUS CONSTELLATUS  Culture, blood (Routine x 2)     Status: Abnormal   Collection Time: 10/08/22  5:19 PM   Specimen: BLOOD  Result Value Ref Range Status    Specimen Description BLOOD RIGHT ANTECUBITAL  Final   Special Requests   Final    BOTTLES DRAWN AEROBIC AND ANAEROBIC Blood Culture adequate volume   Culture  Setup Time   Final    IN BOTH AEROBIC AND ANAEROBIC BOTTLES GRAM POSITIVE COCCI Organism ID to follow CRITICAL RESULT CALLED TO, READ BACK BY AND VERIFIED WITH:  C/ PHARMD H. VON DOHLEN 10/09/22 1608 A. LAFRANCE Performed at [facility], [address], [place], [place] [address]    Culture STREPTOCOCCUS MITIS/ORALIS (A)  Final   Report Status 10/11/2022 FINAL  Final   Organism ID, Bacteria STREPTOCOCCUS MITIS/ORALIS  Final      Susceptibility   Streptococcus mitis/oralis - MIC*    TETRACYCLINE >=16 RESISTANT Resistant     VANCOMYCIN 0.5 SENSITIVE Sensitive     CLINDAMYCIN 0.5 INTERMEDIATE Intermediate     PENICILLIN Value in next row Sensitive      SENSITIVEMIC <=/ 0.06    * STREPTOCOCCUS MITIS/ORALIS  Blood Culture ID Panel (Reflexed)     Status: Abnormal   Collection Time: 10/08/22  5:19 PM  Result Value Ref Range Status   Enterococcus faecalis NOT DETECTED NOT DETECTED Final   Enterococcus Faecium NOT DETECTED NOT DETECTED Final   Listeria monocytogenes NOT DETECTED NOT DETECTED Final   Staphylococcus species NOT DETECTED NOT DETECTED Final   Staphylococcus aureus (BCID) NOT DETECTED  NOT DETECTED Final   Staphylococcus epidermidis NOT DETECTED NOT DETECTED Final   Staphylococcus lugdunensis NOT DETECTED NOT DETECTED Final   Streptococcus species DETECTED (A) NOT DETECTED Final    Comment: Not Enterococcus species, Streptococcus agalactiae, Streptococcus pyogenes, or Streptococcus pneumoniae. CRITICAL RESULT CALLED TO, READ BACK BY AND VERIFIED WITH:  C/ PHARMD H. VON DOHLEN 10/09/22 1608 A. LAFRANCE    Streptococcus agalactiae NOT DETECTED NOT DETECTED Final   Streptococcus pneumoniae NOT DETECTED NOT DETECTED Final   Streptococcus pyogenes NOT DETECTED NOT DETECTED Final   A.calcoaceticus-baumannii NOT  DETECTED NOT DETECTED Final   Bacteroides fragilis NOT DETECTED NOT DETECTED Final   Enterobacterales NOT DETECTED NOT DETECTED Final   Enterobacter cloacae complex NOT DETECTED NOT DETECTED Final   Escherichia coli NOT DETECTED NOT DETECTED Final   Klebsiella aerogenes NOT DETECTED NOT DETECTED Final   Klebsiella oxytoca NOT DETECTED NOT DETECTED Final   Klebsiella pneumoniae NOT DETECTED NOT DETECTED Final   Proteus species NOT DETECTED NOT DETECTED Final   Salmonella species NOT DETECTED NOT DETECTED Final   Serratia marcescens NOT DETECTED NOT DETECTED Final   Haemophilus influenzae NOT DETECTED NOT DETECTED Final   Neisseria meningitidis NOT DETECTED NOT DETECTED Final   Pseudomonas aeruginosa NOT DETECTED NOT DETECTED Final   Stenotrophomonas maltophilia NOT DETECTED NOT DETECTED Final   Candida albicans NOT DETECTED NOT DETECTED Final   Candida auris NOT DETECTED NOT DETECTED Final   Candida glabrata NOT DETECTED NOT DETECTED Final   Candida krusei NOT DETECTED NOT DETECTED Final   Candida parapsilosis NOT DETECTED NOT DETECTED Final   Candida tropicalis NOT DETECTED NOT DETECTED Final   Cryptococcus neoformans/gattii NOT DETECTED NOT DETECTED Final    Comment: Performed at [facility], [address], [place], [place] [address]  Surgical pcr screen     Status: Abnormal   Collection Time: 10/10/22 10:57 AM   Specimen: Nasal Mucosa; Nasal Swab  Result Value Ref Range Status   MRSA, PCR NEGATIVE NEGATIVE Final   Staphylococcus aureus POSITIVE (A) NEGATIVE Final    Comment: RESULT CALLED TO, READ BACK BY AND VERIFIED WITH: 10/10/22 AT 1228 TO RN Hendricks Limes, ADC (NOTE) The Xpert SA Assay (FDA approved for NASAL specimens in patients 44 years of age and older), is one component of a comprehensive surveillance program. It is not intended to diagnose infection nor to guide or monitor treatment. Performed at [facility], [address] [place],  [place] [address]   Aerobic/Anaerobic Culture w Gram Stain (surgical/deep wound)     Status: None (Preliminary result)   Collection Time: 10/10/22 11:33 AM   Specimen: Leg, Right; Tissue  Result Value Ref Range Status   Specimen Description TISSUE ABSCESS  Final   Special Requests RIGHT CALF, FLAGYL  Final   Gram Stain   Final    ABUNDANT WBC SEEN ABUNDANT GRAM POSITIVE COCCI Performed at [facility], [address], [place], [place] [address]    Culture   Final    RARE VIRIDANS STREPTOCOCCUS CULTURE REINCUBATED FOR BETTER GROWTH NO ANAEROBES ISOLATED; CULTURE IN PROGRESS FOR 5 DAYS    Report Status PENDING  Incomplete  Culture, blood (Routine X 2) w Reflex to ID Panel     Status: None (Preliminary result)   Collection Time: 10/11/22 10:31 AM   Specimen: BLOOD RIGHT HAND  Result Value Ref Range Status   Specimen Description BLOOD RIGHT HAND  Final   Special Requests   Final  BOTTLES DRAWN AEROBIC AND ANAEROBIC Blood Culture adequate volume   Culture   Final    NO GROWTH 4 DAYS Performed at [facility], [address], [place], [place] [address]    Report Status PENDING  Incomplete  Culture, blood (Routine X 2) w Reflex to ID Panel     Status: None (Preliminary result)   Collection Time: 10/11/22 10:41 AM   Specimen: BLOOD RIGHT HAND  Result Value Ref Range Status   Specimen Description BLOOD RIGHT HAND  Final   Special Requests   Final    BOTTLES DRAWN AEROBIC ONLY Blood Culture results may not be optimal due to an inadequate volume of blood received in culture bottles   Culture   Final    NO GROWTH 4 DAYS Performed at [facility], [address] [place], [place] [address]    Report Status PENDING  Incomplete  Aerobic/Anaerobic Culture w Gram Stain (surgical/deep wound)     Status: None (Preliminary result)   Collection Time: 10/13/22 11:05 AM   Specimen: Leg, Right; Tissue  Result Value Ref Range Status   Specimen Description TISSUE  Final   Special  Requests NONE  Final   Gram Stain   Final    RARE WBC PRESENT, PREDOMINANTLY MONONUCLEAR NO ORGANISMS SEEN    Culture   Final    NO GROWTH 2 DAYS Performed at [facility], [address] [place], [place] [address]    Report Status PENDING  Incomplete    Studies/Results: ECHO TEE  Result Date: 10/14/2022    TRANSESOPHOGEAL ECHO REPORT   Patient Name:   EMERIC WINHAM Date of Exam: 10/14/2022 Medical Rec #:  [id number]  Height:       69.0 in Accession #:    [id number] Weight:       214.9 lb Date of Birth:  September 14, 1975 BSA:          2.130 m Patient Age:    46 years   BP:           147/85 mmHg Patient Gender: M          HR:           94 bpm. Exam Location:  Inpatient Procedure: Saline Contrast Bubble Study, Transesophageal Echo, Color Doppler and            Cardiac Doppler Indications:     Bacteremia  History:         Patient has prior history of Echocardiogram examinations, most                  recent 10/11/2022. History of stroke; Risk Factors:Diabetes.  Sonographer:     Delcie Roch RDCS Referring Phys:  [phone] Lillia Abed B ROBERTS Diagnosing Phys: Armanda Magic MD PROCEDURE: After discussion of the risks and benefits of a TEE, an informed consent was obtained from the patient. The transesophogeal probe was passed without difficulty through the esophogus of the patient. Imaged were obtained with the patient in a left lateral decubitus position. Sedation performed by different physician. The patient was monitored while under deep sedation. Anesthestetic sedation was provided intravenously by Anesthesiology: 177mg  of Propofol, 100mg  of Lidocaine. The patient's vital signs; including heart rate, blood pressure, and oxygen saturation; remained stable throughout the procedure. The patient developed no complications during the procedure.  IMPRESSIONS  1. Left ventricular ejection fraction, by estimation, is 40 to 45%. The left ventricle has mildly decreased function. The left ventricle demonstrates global  hypokinesis.  2. Right ventricular systolic function is normal. The right ventricular size is normal.  3. No left atrial/left atrial appendage thrombus was detected.  4. The mitral valve is normal in structure. Mild mitral valve regurgitation. No evidence of mitral stenosis.  5. 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 It is not shaggy and mobile as would be seen with a vegetation. It is only seen at around 105 degrees and not in any other views.. The aortic valve is tricuspid. Aortic valve regurgitation is not visualized. Aortic valve sclerosis/calcification is present, without any evidence of aortic stenosis.  6. The inferior vena cava is normal in size with greater than 50% respiratory variability, suggesting right atrial pressure of 3 mmHg. Conclusion(s)/Recommendation(s): Normal biventricular function without evidence of hemodynamically significant valvular heart disease. FINDINGS  Left Ventricle: Left ventricular ejection fraction, by estimation, is 40 to 45%. The left ventricle has mildly decreased function. The left ventricle demonstrates global hypokinesis. The left ventricular internal cavity size was normal in size. There is  no left ventricular hypertrophy. Right Ventricle: The right ventricular size is normal. No increase in right ventricular wall thickness. Right ventricular systolic function is normal. Left Atrium: Left atrial size was normal in size. No left atrial/left atrial appendage thrombus was detected. Right Atrium: Right atrial size was normal in size. Pericardium: There is no evidence of pericardial effusion. Mitral Valve: The mitral valve is normal in structure. Mild mitral valve regurgitation. No evidence of mitral valve stenosis. Tricuspid Valve: The tricuspid valve is normal in structure. Tricuspid valve regurgitation is trivial. No evidence of tricuspid stenosis. 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 It is not shaggy and mobile as would be seen with a vegetation. It is only seen at around 105 degrees and not in any other views. The aortic valve is tricuspid. Aortic valve regurgitation is not visualized. Aortic valve sclerosis/calcification is present, without any evidence of aortic stenosis. Pulmonic Valve: The pulmonic valve was normal in structure. Pulmonic valve regurgitation is trivial. No evidence of pulmonic stenosis. Aorta: The aortic root is normal in size and structure. Venous: The inferior vena cava is normal in size with greater than 50% respiratory variability, suggesting right atrial pressure of 3 mmHg. IAS/Shunts: No atrial level shunt detected by color flow Doppler. Agitated saline contrast was given intravenously to evaluate for intracardiac shunting. Armanda Magic MD Electronically signed by Armanda Magic MD Signature Date/Time: 10/14/2022/1:34:33 PM    Final    EP STUDY  Result Date: 10/14/2022 See surgical note for result.     Assessment/Plan:  INTERVAL HISTORY:   Pt sp surgery again with Dr. Lajoyce Corners  Principal Problem:   Diabetic foot ulcer (HCC) Active Problems:   Diabetes (HCC)   Severe sepsis (HCC)   Hyponatremia   Lactic acidosis   AKI (acute kidney injury) (HCC)   Gastroparesis   Elevated troponin   H/O: CVA (cerebrovascular accident)   Gangrene of right foot (HCC)   Cellulitis of right lower extremity   Necrotizing fasciitis of lower leg (HCC)    Carlos [name] is a 47 y.o. 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 He is growing a viridans from operative site. Report  is that S testing not available for Viridans?  #1 Streptococcal bacteremia due to gangrene and necrotizing fascitis:  We will continue unasyn   He has been fired by employer apparently due to his having been hospitalized which would seem to be illegal  #2 Mistrust of medical providers:  Travares relayed to me multiple losses of family members including his Mother and Father who both 'did everything the doctors told them to do" his Mom apparently "died of a hole in her heart caused by one of the medicines."  He also had bad experience being at [facility] and being on the "wrong antibiotic"  I thanked him for sharing his story and I will make sure I explain what we are doing when we make changes. He told me he "just needs to have things explained to him."  I have personally spent 54 minutes involved in face-to-face and non-face-to-face activities for this patient on the day of the visit. Professional time spent includes the following activities: Preparing to see the patient (review of tests), Obtaining and/or reviewing separately obtained history (admission/discharge record), Performing a medically appropriate examination and/or evaluation , Ordering medications/tests/procedures, referring and communicating with other health care professionals, Documenting clinical information in the EMR, Independently interpreting results (not separately reported), Communicating results to the patient/family/caregiver, Counseling and educating the patient/family/caregiver and Care coordination (not separately reported).    My partner Dr. Drue Second is this weekend and available for questions.  She will follow-up on the sensitivity data of the viridans streptococci isolated from most recent culture.       LOS: 7 days   Acey Lav 10/15/2022, 12:15 PM

## 2022-10-16 ENCOUNTER — Encounter (HOSPITAL_COMMUNITY): Payer: Self-pay | Admitting: Orthopedic Surgery

## 2022-10-16 DIAGNOSIS — A480 Gas gangrene: Secondary | ICD-10-CM

## 2022-10-16 DIAGNOSIS — L97401 Non-pressure chronic ulcer of unspecified heel and midfoot limited to breakdown of skin: Secondary | ICD-10-CM

## 2022-10-16 DIAGNOSIS — B950 Streptococcus, group A, as the cause of diseases classified elsewhere: Secondary | ICD-10-CM

## 2022-10-16 DIAGNOSIS — A491 Streptococcal infection, unspecified site: Secondary | ICD-10-CM

## 2022-10-16 DIAGNOSIS — E08621 Diabetes mellitus due to underlying condition with foot ulcer: Secondary | ICD-10-CM

## 2022-10-16 LAB — BASIC METABOLIC PANEL
Anion gap: 9 (ref 5–15)
BUN: 8 mg/dL (ref 6–20)
CO2: 24 mmol/L (ref 22–32)
Calcium: 7.2 mg/dL — ABNORMAL LOW (ref 8.9–10.3)
Chloride: 98 mmol/L (ref 98–111)
Creatinine, Ser: 0.83 mg/dL (ref 0.61–1.24)
GFR, Estimated: >60 mL/min (ref >60)
Glucose, Bld: 257 mg/dL — ABNORMAL HIGH (ref 70–99)
Potassium: 4.2 mmol/L (ref 3.5–5.1)
Sodium: 131 mmol/L — ABNORMAL LOW (ref 135–145)

## 2022-10-16 LAB — CULTURE, BLOOD (ROUTINE X 2)

## 2022-10-16 LAB — CBC WITH DIFFERENTIAL/PLATELET
Abs Immature Granulocytes: 1.21 10*3/uL — ABNORMAL HIGH (ref 0.00–0.07)
Basophils Absolute: 0 10*3/uL (ref 0.0–0.1)
Basophils Relative: 0 %
Eosinophils Absolute: 0.1 10*3/uL (ref 0.0–0.5)
Eosinophils Relative: 1 %
HCT: 27.2 % — ABNORMAL LOW (ref 39.0–52.0)
Hemoglobin: 8.7 g/dL — ABNORMAL LOW (ref 13.0–17.0)
Immature Granulocytes: 7 %
Lymphocytes Relative: 11 %
Lymphs Abs: 1.8 10*3/uL (ref 0.7–4.0)
MCH: 29.5 pg (ref 26.0–34.0)
MCHC: 32 g/dL (ref 30.0–36.0)
MCV: 92.2 fL (ref 80.0–100.0)
Monocytes Absolute: 1.3 10*3/uL — ABNORMAL HIGH (ref 0.1–1.0)
Monocytes Relative: 8 %
Neutro Abs: 12.3 10*3/uL — ABNORMAL HIGH (ref 1.7–7.7)
Neutrophils Relative %: 73 %
Platelets: 431 10*3/uL — ABNORMAL HIGH (ref 150–400)
RBC: 2.95 MIL/uL — ABNORMAL LOW (ref 4.22–5.81)
RDW: 13.5 % (ref 11.5–15.5)
WBC: 16.6 10*3/uL — ABNORMAL HIGH (ref 4.0–10.5)
nRBC: 0 % (ref 0.0–0.2)

## 2022-10-16 LAB — GLUCOSE, CAPILLARY
Glucose-Capillary: 210 mg/dL — ABNORMAL HIGH (ref 70–99)
Glucose-Capillary: 217 mg/dL — ABNORMAL HIGH (ref 70–99)
Glucose-Capillary: 221 mg/dL — ABNORMAL HIGH (ref 70–99)
Glucose-Capillary: 241 mg/dL — ABNORMAL HIGH (ref 70–99)

## 2022-10-16 LAB — AEROBIC/ANAEROBIC CULTURE W GRAM STAIN (SURGICAL/DEEP WOUND)

## 2022-10-16 NOTE — Progress Notes (Signed)
PROGRESS NOTE  Carlos [name]  DOB: 1976/01/22  PCP: System, Provider Not In ZOX:[id number]  DOA: 10/08/2022  LOS: 8 days  Hospital Day: 9  Brief narrative: As per H&P done by Dr. Melina Schools Dahal: "Carlos [name] is a 47 y.o. male with PMH significant for untreated DM2, gastroparesis, CVA, MRSA cellulitis 2021 5/3, patient presented to the ED with complaint of progressive swelling and blackening of right big toe for 3 weeks 2  weeks ago, hewas seen at urgent care center and was prescribed a course of doxycycline which hecould not tolerate because of nausea and vomiting. His wound continued to grow. He also started having fever, chills, and hence came to the ED.  In the ED, patient had a fever of 102.6, heart rate 126, blood pressure in 90s, breathing on room air She was noted to have edematous right great toe with weeping, foul order and eschar Initial labs with WBC count 27.7, lactic acid 3.3>7.4>2.5 CT right foot with contrast showed extensive subcutaneous and intramuscular gas within the intrinsic musculature of the right foot as well as the terminal visualized right lower extremity musculature, findings suggestive of necrotizing fasciitis.  Acute osteomyelitis of first metatarsal head.  Patient was given IV fluid bolus, broad-spectrum IV antibiotics Admitted to [facility] Orthopedics was consulted. Blood culture obtained on admission grew Streptococcus.  Patient underwent right BKA by Dr. Lajoyce Corners with revision twice so far, last one was today 5/10.  TEE negative for vegetation. On IV antibiotics per ID He has been intermittently resistant and unwilling to commit to IV antibiotics and insulin".  10/16/2022: Patient is to refusing treatment for diabetes mellitus.  Discussed need for tight blood sugar control with patient, in the presence of the patient's nurse.  Respiratory left foot, patient continues to refuse diabetes mellitus treatment, including oral medications.  Patient has been diabetic for over 20  years.  Persistent leukocytosis is noted.  Input from the orthopedic team and infectious disease team is highly appreciated.  Patient is currently on IV Unasyn 3 g every 6 hourly.  Wound VAC to right BKA stump.  Subjective: Patient continues to refuse diabetes mellitus treatment.  Assessment and plan: Severe sepsis - POA Necrotizing fasciitis of right lower extremity S/p right BKA -5/5 Dr. Lajoyce Corners; revision 5/8 & 5/10. Streptococcal bacteremia Presented with worsening right foot wound. Imaging as above showing gas gangrene as well as necrotizing fasciitis Procedures as above. Blood culture sent on admission as well as wound culture grew Streptococcus.  TTE/TEE negative for vegetation.   WBC count improving.  Lactate level improved. Patient has been counseled by multiple providers to comply with IV antibiotics. Recent Labs  Lab 10/10/22 0150 10/11/22 0049 10/12/22 0222 10/14/22 0153 10/16/22 0227  WBC 28.7* 25.3* 20.2* 14.0* 16.6*  LATICACIDVEN 1.8  --   --   --   --    10/16/2022: Sepsis physiology has resolved.  Continue IV Unasyn.  Infectious disease input is appreciated.  Orthopedic input is also appreciated.  AKI Lactic acidosis Due to tissue hypoperfusion and renal hypoperfusion. Levels improved. Recent Labs    10/08/22 1719 10/08/22 2109 10/09/22 0430 10/09/22 0803 10/10/22 0150 10/11/22 0049 10/12/22 0222 10/13/22 0202 10/14/22 0153 10/16/22 0227  BUN 19 23* 23* 19 16 21* 16 10 7 8   CREATININE 2.05* 1.88* 1.57* 1.43* 1.28* 1.14 0.99 0.79 0.67 0.83   10/16/2022: AKI has resolved.  Acute hyponatremia baseline sodium level was normal.  Presented with low level of 123.   Serum osmolality is normal but urine  osmolality is elevated.   Gradually improving. Recent Labs  Lab 10/10/22 0150 10/11/22 0049 10/12/22 0222 10/13/22 0202 10/14/22 0153 10/16/22 0227  NA 126* 127* 128* 132* 133* 131*   10/16/2022: Likely secondary to volume depletion/possible SIADH.   Repeat urine sodium.  Repeat urine osmolality and serum osmolality.  Elevated troponin Demand ischemia likely secondary to sepsis/infection.  No active anginal symptoms. However, given his significant cardiac risk factors, he may have underlying CAD as well. Follow-up with cardiology as an outpatient for risk stratification.  Type 2 diabetes mellitus uncontrolled with hyperglycemia A1c 10.4 on 10/08/2022.  Presented with a blood sugar level elevated over 500 PTA not on any meds. Currently on Semglee 30 units nightly along with moderate SSI/Accu-Cheks. Encouraged compliance to insulin. Recent Labs  Lab 10/15/22 1619 10/15/22 2011 10/16/22 0808 10/16/22 1119 10/16/22 1617  GLUCAP 182* 166* 210* 217* 221*   10/16/2022: Patient continues to refuse insulin diabetes mellitus treatment.  Diabetic gastroparesis PTA not on any meds.   Patient is complaining of nausea with anything he eats.  I offered him IV Reglan but he states it did not work for him in the past and he does not want to try it again.   H/o CVA Not on any meds    Mobility: PT eval obtained.  CIR versus SNF  Goals of care   Code Status: Full Code    DVT prophylaxis:  SCD's Start: 10/15/22 1019 SCD's Start: 10/13/22 1248 SCD's Start: 10/10/22 1342 heparin injection 5,000 Units Start: 10/09/22 2200   Antimicrobials: IV Unasyn per ID Fluid: Not on IV Consultants: Orthopedics Family Communication: None at bedside  Status: Inpatient Level of care:  Telemetry Medical   Patient from: Home Anticipated d/c to: Pending clinical course Needs to continue in-hospital care:  Underwent revision BKA today.  Has wound VAC in place.   Diet:  Diet Order             Diet Carb Modified Fluid consistency: Thin; Room service appropriate? Yes  Diet effective now                   Scheduled Meds:  (feeding supplement) PROSource Plus  30 mL Oral TID BM   vitamin C  1,000 mg Oral Daily   Chlorhexidine Gluconate Cloth   6 each Topical Daily   docusate sodium  100 mg Oral Daily   heparin  5,000 Units Subcutaneous Q8H   insulin aspart  0-15 Units Subcutaneous TID WC   insulin aspart  0-5 Units Subcutaneous QHS   insulin glargine-yfgn  30 Units Subcutaneous QHS   insulin starter kit- pen needles  1 kit Other Once   living well with diabetes book   Does not apply Once   mupirocin ointment  1 Application Nasal BID   nutrition supplement (JUVEN)  1 packet Oral BID BM   pantoprazole  40 mg Oral Daily   zinc sulfate  220 mg Oral Daily    PRN meds: acetaminophen, alum & mag hydroxide-simeth, bisacodyl, guaiFENesin-dextromethorphan, hydrALAZINE, HYDROmorphone (DILAUDID) injection, labetalol, magnesium citrate, magnesium sulfate bolus IVPB, metoprolol tartrate, morphine injection, ondansetron, oxyCODONE, oxyCODONE, phenol, polyethylene glycol, potassium chloride, senna-docusate   Infusions:   sodium chloride Stopped (10/16/22 1701)   ampicillin-sulbactam (UNASYN) IV 200 mL/hr at 10/16/22 1730   magnesium sulfate bolus IVPB      Antimicrobials: Anti-infectives (From admission, onward)    Start     Dose/Rate Route Frequency Ordered Stop   10/15/22 0745  ceFAZolin (ANCEF) IVPB 2g/100  mL premix        2 g 200 mL/hr over 30 Minutes Intravenous On call to O.R. 10/15/22 0734 10/15/22 0907   10/15/22 0711  ceFAZolin (ANCEF) 2-4 GM/100ML-% IVPB       Note to Pharmacy: Jamelle Rushing, GRETA: cabinet override      10/15/22 0711 10/15/22 0837   10/14/22 1600  Ampicillin-Sulbactam (UNASYN) 3 g in sodium chloride 0.9 % 100 mL IVPB        3 g 200 mL/hr over 30 Minutes Intravenous Every 6 hours 10/14/22 1008     10/13/22 0600  ceFAZolin (ANCEF) IVPB 2g/100 mL premix        2 g 200 mL/hr over 30 Minutes Intravenous On call to O.R. 10/13/22 0207 10/13/22 1124   10/11/22 2100  metroNIDAZOLE (FLAGYL) tablet 500 mg  Status:  Discontinued        500 mg Oral Every 12 hours 10/11/22 1121 10/11/22 1856   10/11/22 2000  metroNIDAZOLE  (FLAGYL) IVPB 500 mg  Status:  Discontinued        500 mg 100 mL/hr over 60 Minutes Intravenous 2 times daily 10/11/22 1856 10/14/22 1008   10/11/22 1600  cefTRIAXone (ROCEPHIN) 2 g in sodium chloride 0.9 % 100 mL IVPB  Status:  Discontinued        2 g 200 mL/hr over 30 Minutes Intravenous Every 24 hours 10/11/22 0958 10/14/22 1008   10/10/22 1345  ceFAZolin (ANCEF) IVPB 2g/100 mL premix  Status:  Discontinued        2 g 200 mL/hr over 30 Minutes Intravenous Every 8 hours 10/10/22 1341 10/10/22 1528   10/10/22 1045  ceFAZolin (ANCEF) IVPB 2g/100 mL premix  Status:  Discontinued        2 g 200 mL/hr over 30 Minutes Intravenous On call to O.R. 10/10/22 0947 10/10/22 1013   10/10/22 0955  metroNIDAZOLE (FLAGYL) 500 MG/100ML IVPB       Note to Pharmacy: Launa Flight M: cabinet override      10/10/22 0955 10/10/22 1115   10/09/22 2100  vancomycin (VANCOREADY) IVPB 1250 mg/250 mL  Status:  Discontinued        1,250 mg 166.7 mL/hr over 90 Minutes Intravenous Every 24 hours 10/09/22 1604 10/11/22 0957   10/09/22 1800  ceFEPIme (MAXIPIME) 2 g in sodium chloride 0.9 % 100 mL IVPB  Status:  Discontinued        2 g 200 mL/hr over 30 Minutes Intravenous Every 8 hours 10/09/22 1616 10/11/22 0957   10/09/22 0600  ceFEPIme (MAXIPIME) 2 g in sodium chloride 0.9 % 100 mL IVPB  Status:  Discontinued        2 g 200 mL/hr over 30 Minutes Intravenous Every 12 hours 10/08/22 2103 10/09/22 1616   10/08/22 2103  vancomycin variable dose per unstable renal function (pharmacist dosing)  Status:  Discontinued         Does not apply See admin instructions 10/08/22 2103 10/09/22 1604   10/08/22 2015  vancomycin (VANCOREADY) IVPB 2000 mg/400 mL        2,000 mg 200 mL/hr over 120 Minutes Intravenous  Once 10/08/22 2005 10/08/22 2250   10/08/22 1800  ceFEPIme (MAXIPIME) 2 g in sodium chloride 0.9 % 100 mL IVPB        2 g 200 mL/hr over 30 Minutes Intravenous  Once 10/08/22 1748 10/08/22 1916   10/08/22 1745   metroNIDAZOLE (FLAGYL) IVPB 500 mg  Status:  Discontinued  500 mg 100 mL/hr over 60 Minutes Intravenous Every 12 hours 10/08/22 1736 10/11/22 1121       Nutritional status:  Body mass index is 31.91 kg/m.  Nutrition Problem: Increased nutrient needs Etiology: wound healing, post-op healing Signs/Symptoms: estimated needs     Objective: Vitals:   10/16/22 0807 10/16/22 1515  BP: (!) 168/87 (!) 154/78  Pulse: (!) 103 100  Resp:    Temp: 98.3 F (36.8 C) 98.3 F (36.8 C)  SpO2: 99% 100%    Intake/Output Summary (Last 24 hours) at 10/16/2022 1754 Last data filed at 10/16/2022 1730 Gross per 24 hour  Intake 2289.76 ml  Output --  Net 2289.76 ml    Filed Weights   10/08/22 1708 10/08/22 1947 10/15/22 0733  Weight: 97.5 kg 97.5 kg 98 kg   Weight change:  Body mass index is 31.91 kg/m.   Physical Exam: General exam: Patient is obese.  Patient is awake and alert.  Patient is able to receive and process medical information, hence, patient has capacity to make medical decisions.   HEENT: Patient is pale.  No jaundice.   Lungs: Clear to auscultation CVS: S1-S2.   GI/Abd soft and nontender. CNS: Awake and alert.   Extremities: S/p right BKA.  Wound VAC in place  Data Review: I have personally reviewed the laboratory data and studies available.  F/u labs ordered Unresulted Labs (From admission, onward)    None       Total time spent in review of labs and imaging, patient evaluation, formulation of plan, documentation and communication with family:  Signed, Barnetta Chapel, MD [facility] 10/16/2022

## 2022-10-16 NOTE — Progress Notes (Signed)
   10/16/22 1100  Spiritual Encounters  Type of Visit Follow up  Care provided to: Patient  Conversation partners present during encounter Nurse  Referral source Nurse (RN/NT/LPN)  Reason for visit Trauma  OnCall Visit No  Spiritual Framework  Presenting Themes Coping tools  Patient Stress Factors Major life changes;Loss  Family Stress Factors None identified  Interventions  Spiritual Care Interventions Made Established relationship of care and support  Spiritual Care Plan  Spiritual Care Issues Still Outstanding No further spiritual care needs at this time (see row info)   Chaplain met with patient at RN's request.  RN believed he is not dealing with his empotions regarding his recent amputation surgery.  Patient was alert and sitting up and being attended to by assistant staff.  Spoke politely but stated "I've already been seen several times by Chaplains and I'm good."  Chaplain provided emtional and spiritual support if and when needed in the future.  Patient thanks Chaplain and visit was ended with a departing blessing.  No follow up needed at this time.    Rev., Dyke Brackett

## 2022-10-16 NOTE — Progress Notes (Signed)
Patient currently refusing majority of medications.  Patient refusing heparin injections, RN educated patient on reason for heparin injections and patient advised he does not wish to receive this medication.  Patient refusing insulin.  RN educated patient on reason for blood sugar checks and insulin administration as it directly correlates to wound healing and patient advised RN he does not believe his sugars to be high, RN reviewed patient's blood sugar results with patient, but patient advised he does not wish to receive insulin injections.  Patient also refusing his PO medications, stated he doesn't wish to take these medications.  Stated MD has not informed him as to why he is taking these medications and he doesn't wish to take them.  RN provided education on PO medications and patient continued to advise RN he did not wish to receive these medications.  Patient also refusing nutritional supplements, patient stated they cause him emesis.  MD Ogbata notified.

## 2022-10-16 NOTE — Progress Notes (Signed)
Wound Vac not working properly and reading "blockage alert".  RN assessed wound vac, no blockages noted, no closed clamps, and no kinks.   Changed Wound vac canister and had another RN assess wound vac but unable to clear warning resulting in loss of suction and wound vac not running properly.    Notified MD Lajoyce Corners.  MD Lajoyce Corners advised to remove the wound vac and apply a 4x4 gauze dressing and ace wrap.

## 2022-10-16 NOTE — Evaluation (Signed)
Physical Therapy Re-Evaluation Patient Details Name: Carlos [name] MRN: [id number] DOB: 05/28/1976 Today's Date: 10/16/2022  History of Present Illness  47 y.o. male presented to [facility] ED 10/08/22 with complaint of progressive swelling and blackening of right big toe for 3 weeks. 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  PMH significant for untreated DM2, gastroparesis, CVA, MRSA cellulitis 2021.  Clinical Impression  Pt admitted with above diagnosis. Pt received in bed and very motivated to mobilize. Pt educated on phantom limb sensations and strategies to cope with this as he is beginning to have some feelings in that extremity. Pt able to come to EOB with min guard A. Pt stood to RW with min A and ambulation initiated with min A and RW. Pt needed assist donning L shoe before ambulation. Worked on taking full wt through upper body and gliding fwd more than hopping to decrease strain on LLE. Pt doing well with this. Patient will benefit from intensive inpatient follow up therapy, >3 hours/day.  Pt currently with functional limitations due to the deficits listed below (see PT Problem List). Pt will benefit from acute skilled PT to increase their independence and safety with mobility to allow discharge.          Recommendations for follow up therapy are one component of a multi-disciplinary discharge planning process, led by the attending physician.  Recommendations may be updated based on patient status, additional functional criteria and insurance authorization.  Follow Up Recommendations       Assistance Recommended at Discharge Frequent or constant Supervision/Assistance  Patient can return home with the following  A lot of help with walking and/or transfers;A little help with bathing/dressing/bathroom    Equipment Recommendations Rolling walker (2 wheels);BSC/3in1;Wheelchair (measurements PT);Wheelchair cushion (measurements PT)  Recommendations for Other Services  Rehab consult     Functional Status Assessment Patient has had a recent decline in their functional status and demonstrates the ability to make significant improvements in function in a reasonable and predictable amount of time.     Precautions / Restrictions Precautions Precautions: Fall Precaution Comments: R BKA, Wound vac Restrictions Weight Bearing Restrictions: Yes RLE Weight Bearing: Non weight bearing Other Position/Activity Restrictions: Limb protector      Mobility  Bed Mobility Overal bed mobility: Needs Assistance Bed Mobility: Supine to Sit     Supine to sit: Supervision, HOB elevated     General bed mobility comments: pt able to manage RLE and come to EOB without physical assist    Transfers Overall transfer level: Needs assistance Equipment used: Rolling walker (2 wheels) Transfers: Sit to/from Stand Sit to Stand: Min assist Stand pivot transfers: Min assist         General transfer comment: Cues for scooting to edge of chair prior to standing, hand placement.    Ambulation/Gait Ambulation/Gait assistance: Min assist Gait Distance (Feet): 5 Feet (fwd and bkwd, 2x) Assistive device: Rolling walker (2 wheels) Gait Pattern/deviations:  (hop to) Gait velocity: decreased Gait velocity interpretation: <1.31 ft/sec, indicative of household ambulator Pre-gait activities: lfting L foot up and down in place with RW General Gait Details: pt first hopped on L foot bed to chair. Then worked on taking full wt through upper body and gliding fwd and bkwd for less impact on LLE. L shoe donned before gait  Stairs            Wheelchair Mobility    Modified Rankin (Stroke Patients Only)       Balance Overall balance assessment: Needs  assistance Sitting-balance support: Feet supported Sitting balance-Leahy Scale: Normal Sitting balance - Comments: sitting EOB   Standing balance support: Bilateral upper extremity supported, During functional activity, Reliant on  assistive device for balance Standing balance-Leahy Scale: Poor Standing balance comment: stable with use of RW but pt can be mildly impulsive since he has been used to being independent, cues to slow down and think through mvmts                             Pertinent Vitals/Pain Pain Assessment Pain Assessment: Faces Faces Pain Scale: Hurts a little bit Pain Location: R LE Pain Descriptors / Indicators: Discomfort, Sore Pain Intervention(s): Limited activity within patient's tolerance, Monitored during session    Home Living Family/patient expects to be discharged to:: Private residence Living Arrangements: Children Available Help at Discharge: Family;Available PRN/intermittently;Friend(s) Type of Home: Apartment Home Access: Level entry       Home Layout: One level Home Equipment: None Additional Comments: daughter works and attends school but is in and out throughout the day. 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 Independent with mobility, was playing basketball ADLs Comments: independent     Hand Dominance   Dominant Hand: Right    Extremity/Trunk Assessment   Upper Extremity Assessment Upper Extremity Assessment: Overall WFL for tasks assessed    Lower Extremity Assessment Lower Extremity Assessment: RLE deficits/detail RLE Deficits / Details: s/p transtibial amp. Hip and knee ROM currently WNL RLE Sensation: decreased proprioception;decreased light touch RLE Coordination: WNL    Cervical / Trunk Assessment Cervical / Trunk Assessment: Other exceptions Cervical / Trunk Exceptions: body  habitus  Communication   Communication: No difficulties  Cognition Arousal/Alertness: Awake/alert Behavior During Therapy: WFL for  tasks assessed/performed Overall Cognitive Status: Within Functional Limits for tasks assessed                                 General Comments: pt being very positive. Discussed the emotional implications of limb loss and allowing himself to grieve this change in his life. Pt voiced understanding        General Comments General comments (skin integrity, edema, etc.): discussed phantom sensations and how to deal with this as well as grieving for lost limb, esp since sequence of events leading to amp was so short. Paged chaplain for consultation to allow pt to talk through this.    Exercises Amputee Exercises Quad Sets: AROM, 10 reps, Seated, Right Gluteal Sets: AROM, 10 reps, Seated, Right Hip ABduction/ADduction: AROM, Right, 10 reps, Seated Hip Flexion/Marching: AROM, Right, 10 reps, Supine Knee Flexion: AROM, Right, 10 reps, Seated Knee Extension: AROM, Right, 10 reps, Seated   Assessment/Plan    PT Assessment Patient needs continued PT services  PT Problem List Decreased strength;Decreased activity tolerance;Decreased balance;Decreased mobility;Decreased skin integrity;Decreased knowledge of use of DME;Pain;Obesity       PT Treatment Interventions DME instruction;Therapeutic exercise;Gait training;Functional mobility training;Therapeutic activities;Patient/family education;Balance training;Wheelchair mobility training    PT Goals (Current goals can be found in the Care Plan section)  Acute Rehab PT Goals Patient Stated Goal: to improve PT Goal Formulation: With patient Time For Goal  Achievement: 10/30/22 Potential to Achieve Goals: Good    Frequency Min 5X/week     Co-evaluation               AM-PAC PT "6 Clicks" Mobility  Outcome Measure Help needed turning from your back to your side while in a flat bed without using bedrails?: A Little Help needed moving from lying on your back to sitting on the side of a flat bed without using bedrails?: A  Little Help needed moving to and from a bed to a chair (including a wheelchair)?: A Little Help needed standing up from a chair using your arms (e.g., wheelchair or bedside chair)?: A Little Help needed to walk in hospital room?: A Little Help needed climbing 3-5 steps with a railing? : Total 6 Click Score: 16    End of Session Equipment Utilized During Treatment: Gait belt Activity Tolerance: Patient tolerated treatment well Patient left: in chair;with call bell/phone within reach;with chair alarm set Nurse Communication: Mobility status PT Visit Diagnosis: Unsteadiness on feet (R26.81);Other abnormalities of gait and mobility (R26.89);Difficulty in walking, not elsewhere classified (R26.2);Pain Pain - Right/Left: Right Pain - part of body: Leg    Time: 1610-9604 PT Time Calculation (min) (ACUTE ONLY): 42 min   Charges:   PT Evaluation $PT Re-evaluation: 1 Re-eval PT Treatments $Gait Training: 8-22 mins $Therapeutic Exercise: 8-22 mins        Lyanne Co, PT  Acute Rehab Services Secure chat preferred Office [phone]   Lawana Chambers Federick Levene 10/16/2022, 11:36 AM

## 2022-10-16 NOTE — Evaluation (Signed)
Occupational Therapy Evaluation Patient Details Name: Carlos [name] MRN: [id number] DOB: [birth year] Today's Date: 10/16/2022   History of Present Illness 47 y.o. male presented to [facility] ED 10/08/22 with complaint of progressive swelling and blackening of right big toe for 3 weeks. 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  PMH significant for untreated DM2, gastroparesis, CVA, MRSA cellulitis 2021.   Clinical Impression   Pt now s/p BKA, thus performed re-evaluation. Pt presenting with decreased balance, safety, knowledge of mobility and expected progression after having surgery. Pt educated and demonstrating transfers with min A and cues for technique. Education regarding LB ADL, donning/doffing limb protector, decreasing phantom limb sensation, positioning, and expected rehabilitation/healing course. Pt requiring significant assist for LB ADL and transfers at this time. Due to significant change in functional status, recommending intensive rehabilitation >3hours/day.     Recommendations for follow up therapy are one component of a multi-disciplinary discharge planning process, led by the attending physician.  Recommendations may be updated based on patient status, additional functional criteria and insurance authorization.   Assistance Recommended at Discharge Frequent or constant Supervision/Assistance  Patient can return home with the following A lot of help with walking and/or transfers;A lot of help with bathing/dressing/bathroom;Assistance with cooking/housework;Help with stairs or ramp for entrance;Assist for transportation    Functional Status Assessment  Patient has had a recent decline in their functional status and demonstrates the ability to make significant improvements in function in a reasonable and predictable amount of time.  Equipment Recommendations  Other (comment) (defer)    Recommendations for Other Services Rehab consult     Precautions / Restrictions  Precautions Precautions: Fall Precaution Comments: R BKA, Wound vac Restrictions Weight Bearing Restrictions: Yes RLE Weight Bearing: Non weight bearing Other Position/Activity Restrictions: Limb protector      Mobility Bed Mobility Overal bed mobility: Needs Assistance Bed Mobility: Supine to Sit     Supine to sit: Supervision, HOB elevated     General bed mobility comments: pt able to manage RLE and come to EOB without physical assist    Transfers Overall transfer level: Needs assistance Equipment used: Rolling walker (2 wheels) Transfers: Sit to/from Stand Sit to Stand: Min assist Stand pivot transfers: Min assist   Step pivot transfers: Min assist     General transfer comment: Good hand placement. Min A to boost up from EOB during STS and manage RW/maintain balance during mobility      Balance Overall balance assessment: Needs assistance Sitting-balance support: Feet supported Sitting balance-Leahy Scale: Normal Sitting balance - Comments: sitting EOB   Standing balance support: Bilateral upper extremity supported, During functional activity, Reliant on assistive device for balance Standing balance-Leahy Scale: Poor Standing balance comment: stable with use of RW but pt can be mildly impulsive since he has been used to being independent, cues to slow down and think through mvmts                           ADL either performed or assessed with clinical judgement   ADL Overall ADL's : Needs assistance/impaired Eating/Feeding: Independent;Sitting   Grooming: Set up;Sitting Grooming Details (indicate cue type and reason): EOB Upper Body Bathing: Set up;Sitting   Lower Body Bathing: Maximal assistance;Sit to/from stand   Upper Body Dressing : Set up;Sitting   Lower Body Dressing: Maximal assistance;Sit to/from stand   Toilet Transfer: Stand-pivot;Rolling walker (2 wheels);Minimal assistance Toilet Transfer Details (indicate cue type and reason):  simulated to recliner;  Cues for taking his time and relying on arms during progression of LE Toileting- Clothing Manipulation and Hygiene: Total assistance;Sitting/lateral lean       Functional mobility during ADLs: Minimal assistance;Rolling walker (2 wheels)       Vision Baseline Vision/History: 1 Wears glasses (or contacts for all the time) Ability to See in Adequate Light: 0 Adequate Patient Visual Report: No change from baseline Vision Assessment?: No apparent visual deficits     Perception     Praxis      Pertinent Vitals/Pain Pain Assessment Pain Assessment: Faces Faces Pain Scale: Hurts a little bit Pain Location: R LE Pain Descriptors / Indicators: Discomfort, Sore Pain Intervention(s): Limited activity within patient's tolerance, Monitored during session     Hand Dominance Right   Extremity/Trunk Assessment Upper Extremity Assessment Upper Extremity Assessment: Overall WFL for tasks assessed   Lower Extremity Assessment Lower Extremity Assessment: Defer to PT evaluation   Cervical / Trunk Assessment Cervical / Trunk Assessment: Other exceptions Cervical / Trunk Exceptions: body  habitus   Communication Communication Communication: No difficulties   Cognition Arousal/Alertness: Awake/alert Behavior During Therapy: WFL for tasks assessed/performed Overall Cognitive Status: Within Functional Limits for tasks assessed                                 General Comments: Good carry over of most information learned in PT session this morning     General Comments  discussed phantom sensations and how to deal with this as well as grieving for lost limb. Educated regaring optimal position as well as donning/doffing limb protector    Exercises     Shoulder Instructions      Home Living Family/patient expects to be discharged to:: Private residence Living Arrangements: Children Available Help at Discharge: Family;Available  PRN/intermittently;Friend(s) Type of Home: Apartment Home Access: Level entry     Home Layout: One level     Bathroom Shower/Tub: Chief Strategy Officer: Standard     Home Equipment: None   Additional Comments: daughter works and attends school but is in and out throughout the day. 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 Independent with mobility, was playing basketball ADLs Comments: independent        OT Problem List: Decreased strength;Decreased coordination;Pain;Decreased activity tolerance;Decreased safety awareness;Impaired balance (sitting and/or standing);Decreased knowledge of use of DME or AE;Decreased knowledge of precautions      OT Treatment/Interventions: Self-care/ADL training;Therapeutic exercise;Therapeutic activities;Neuromuscular education;Energy conservation;Patient/family education;DME and/or AE instruction;Manual therapy;Balance training;Modalities    OT Goals(Current goals can be found in the care plan section) Acute Rehab OT Goals Patient Stated Goal: keep moving OT Goal Formulation: Patient unable to participate in goal setting Time For Goal Achievement: 10/30/22 Potential to Achieve Goals: Good  OT Frequency: Min 2X/week    Co-evaluation              AM-PAC OT "6 Clicks" Daily Activity     Outcome Measure Help from another person eating meals?: None Help from another person taking care of personal grooming?: A Little Help from another person toileting, which includes using toliet, bedpan, or urinal?: Total Help from another person bathing (including  washing, rinsing, drying)?: A Lot Help from another person to put on and taking off regular upper body clothing?: A  Little Help from another person to put on and taking off regular lower body clothing?: A Lot 6 Click Score: 15   End of Session Equipment Utilized During Treatment: Gait belt;Rolling walker (2 wheels) Nurse Communication: Mobility status  Activity Tolerance: Patient tolerated treatment well Patient left: with call bell/phone within reach;in bed;with bed alarm set;with family/visitor present  OT Visit Diagnosis: Unsteadiness on feet (R26.81);Muscle weakness (generalized) (M62.81)                Time: 0981-1914 OT Time Calculation (min): 18 min Charges:  OT General Charges $OT Visit: 1 Visit OT Evaluation $OT Re-eval: 1 Re-eval  Tyler Deis, OTR/L [facility] Acute Rehabilitation Office: [phone]'   Myrla Halsted 10/16/2022, 5:27 PM

## 2022-10-16 NOTE — Progress Notes (Signed)
Patient ID: Carlos [name], male   DOB: 07-Jan-1976, 47 y.o.   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  There is 25 cc in the wound VAC canister.  Patient may discharge when he is safe with therapy.  Patient's white cell count still elevated at 16.6.  Hemoglobin stable at 8.7.  Still elevated absolute neutrophil count.  Patient will still need long-term antibiotics as per infectious disease.

## 2022-10-17 LAB — OSMOLALITY, URINE: Osmolality, Ur: 337 mOsm/kg (ref 300–900)

## 2022-10-17 LAB — RENAL FUNCTION PANEL
Albumin: 1.6 g/dL — ABNORMAL LOW (ref 3.5–5.0)
Anion gap: 9 (ref 5–15)
BUN: 5 mg/dL — ABNORMAL LOW (ref 6–20)
CO2: 25 mmol/L (ref 22–32)
Calcium: 7.6 mg/dL — ABNORMAL LOW (ref 8.9–10.3)
Chloride: 99 mmol/L (ref 98–111)
Creatinine, Ser: 0.71 mg/dL (ref 0.61–1.24)
GFR, Estimated: >60 mL/min (ref >60)
Glucose, Bld: 233 mg/dL — ABNORMAL HIGH (ref 70–99)
Phosphorus: 2 mg/dL — ABNORMAL LOW (ref 2.5–4.6)
Potassium: 4 mmol/L (ref 3.5–5.1)
Sodium: 133 mmol/L — ABNORMAL LOW (ref 135–145)

## 2022-10-17 LAB — TYPE AND SCREEN
ABO/RH(D): A POS
Antibody Screen: NEGATIVE
Unit division: 0

## 2022-10-17 LAB — GLUCOSE, CAPILLARY
Glucose-Capillary: 188 mg/dL — ABNORMAL HIGH (ref 70–99)
Glucose-Capillary: 176 mg/dL — ABNORMAL HIGH (ref 70–99)
Glucose-Capillary: 175 mg/dL — ABNORMAL HIGH (ref 70–99)
Glucose-Capillary: 202 mg/dL — ABNORMAL HIGH (ref 70–99)

## 2022-10-17 LAB — BPAM RBC
Blood Product Expiration Date: 202406022359
Unit Type and Rh: 6200

## 2022-10-17 LAB — AEROBIC/ANAEROBIC CULTURE W GRAM STAIN (SURGICAL/DEEP WOUND)

## 2022-10-17 LAB — OSMOLALITY: Osmolality: 286 mOsm/kg (ref 275–295)

## 2022-10-17 LAB — SODIUM, URINE, RANDOM: Sodium, Ur: 80 mmol/L

## 2022-10-17 NOTE — Plan of Care (Signed)

## 2022-10-17 NOTE — Progress Notes (Signed)
PROGRESS NOTE  Carlos [name]  DOB: 1976-05-01  PCP: System, Provider Not In WJX:[id number]  DOA: 10/08/2022  LOS: 9 days  Hospital Day: 10  Brief narrative: Patient is a 47 year old African-American male with past medical history significant for untreated diabetes mellitus type 2, gastroparesis, CVA, MRSA cellulitis 2021.  Patient was admitted with progressive swelling and blackening of right big toe.  Patient was seen at urgent care center and was prescribed a course of doxycycline which hecould not tolerate because of nausea and vomiting. His wound continued to grow. He also started having fever, chills, and hence came to the ED.  In the ED, patient had a fever of 102.6, heart rate 126, blood pressure in 90s, breathing on room air She was noted to have edematous right great toe with weeping, foul order and eschar Initial labs with WBC count 27.7, lactic acid 3.3>7.4>2.5  CT right foot with contrast showed extensive subcutaneous and intramuscular gas within the intrinsic musculature of the right foot as well as the terminal visualized right lower extremity musculature, findings suggestive of necrotizing fasciitis.  Acute osteomyelitis of first metatarsal head.  Blood culture obtained on admission grew Streptococcus.  Patient underwent right BKA by Dr. Lajoyce Corners with revision twice so far, last one was 5/10.  TEE negative for vegetation.  Patient is on IV antibiotics per ID.  Patient has been intermittently resistant and unwilling to commit to IV antibiotics and insulin.  10/16/2022: Patient is to refusing treatment for diabetes mellitus.  Discussed need for tight blood sugar control with patient, in the presence of the patient's nurse.  Respiratory left foot, patient continues to refuse diabetes mellitus treatment, including oral medications.  Patient has been diabetic for over 20 years.  Persistent leukocytosis is noted.  Input from the orthopedic team and infectious disease team is highly appreciated.   Patient is currently on IV Unasyn 3 g every 6 hourly.  Wound VAC to right BKA stump.  10/17/2022: Patient seen.  No new changes.  Blood sugar is better controlled today.  Subjective: Patient continues to refuse diabetes mellitus treatment.  Assessment and plan: Severe sepsis - POA Necrotizing fasciitis of right lower extremity S/p right BKA -5/5 Dr. Lajoyce Corners; revision 5/8 & 5/10. Streptococcal bacteremia Presented with worsening right foot wound. Imaging as above showing gas gangrene as well as necrotizing fasciitis Procedures as above. Blood culture sent on admission as well as wound culture grew Streptococcus.  TTE/TEE negative for vegetation.   WBC count improving.  Lactate level improved. Patient has been counseled by multiple providers to comply with IV antibiotics. Recent Labs  Lab 10/11/22 0049 10/12/22 0222 10/14/22 0153 10/16/22 0227  WBC 25.3* 20.2* 14.0* 16.6*   10/17/2022: Sepsis physiology has resolved.  Continue IV Unasyn.  Infectious disease input is appreciated.  Orthopedic input is also appreciated.  AKI Lactic acidosis Due to tissue hypoperfusion and renal hypoperfusion. Levels improved. Recent Labs    10/08/22 2109 10/09/22 0430 10/09/22 0803 10/10/22 0150 10/11/22 0049 10/12/22 0222 10/13/22 0202 10/14/22 0153 10/16/22 0227 10/17/22 0256  BUN 23* 23* 19 16 21* 16 10 7 8  5*  CREATININE 1.88* 1.57* 1.43* 1.28* 1.14 0.99 0.79 0.67 0.83 0.71   10/17/2022: AKI has resolved.  Acute hyponatremia baseline sodium level was normal.  Presented with low level of 123.   Serum osmolality is normal but urine osmolality is elevated.   Gradually improving. Recent Labs  Lab 10/11/22 0049 10/12/22 0222 10/13/22 0202 10/14/22 0153 10/16/22 0227 10/17/22 0256  NA 127* 128*  132* 133* 131* 133*   10/16/2022: Likely secondary to volume depletion/possible SIADH.  Repeat urine sodium.  Repeat urine osmolality and serum osmolality.  Elevated troponin Demand ischemia  likely secondary to sepsis/infection.  No active anginal symptoms. However, given his significant cardiac risk factors, he may have underlying CAD as well. Follow-up with cardiology as an outpatient for risk stratification.  Type 2 diabetes mellitus uncontrolled with hyperglycemia A1c 10.4 on 10/08/2022.  Presented with a blood sugar level elevated over 500 PTA not on any meds. Currently on Semglee 30 units nightly along with moderate SSI/Accu-Cheks. Encouraged compliance to insulin. Recent Labs  Lab 10/16/22 1617 10/16/22 2052 10/17/22 0746 10/17/22 1141 10/17/22 1605  GLUCAP 221* 241* 188* 176* 175*   10/16/2022: Patient continues to refuse insulin diabetes mellitus treatment.  Diabetic gastroparesis PTA not on any meds.   Patient is complaining of nausea with anything he eats.  I offered him IV Reglan but he states it did not work for him in the past and he does not want to try it again.   H/o CVA Not on any meds    Mobility: PT eval obtained.  CIR versus SNF  Goals of care   Code Status: Full Code    DVT prophylaxis:  SCD's Start: 10/15/22 1019 SCD's Start: 10/13/22 1248 SCD's Start: 10/10/22 1342 heparin injection 5,000 Units Start: 10/09/22 2200   Antimicrobials: IV Unasyn per ID Fluid: Not on IV Consultants: Orthopedics Family Communication: None at bedside  Status: Inpatient Level of care:  Telemetry Medical   Patient from: Home Anticipated d/c to: Pending clinical course Needs to continue in-hospital care:  Underwent revision BKA today.  Has wound VAC in place.   Diet:  Diet Order             Diet Carb Modified Fluid consistency: Thin; Room service appropriate? Yes  Diet effective now                   Scheduled Meds:  (feeding supplement) PROSource Plus  30 mL Oral TID BM   vitamin C  1,000 mg Oral Daily   docusate sodium  100 mg Oral Daily   heparin  5,000 Units Subcutaneous Q8H   insulin aspart  0-15 Units Subcutaneous TID WC   insulin  aspart  0-5 Units Subcutaneous QHS   insulin glargine-yfgn  30 Units Subcutaneous QHS   insulin starter kit- pen needles  1 kit Other Once   living well with diabetes book   Does not apply Once   nutrition supplement (JUVEN)  1 packet Oral BID BM   pantoprazole  40 mg Oral Daily   zinc sulfate  220 mg Oral Daily    PRN meds: acetaminophen, alum & mag hydroxide-simeth, bisacodyl, guaiFENesin-dextromethorphan, hydrALAZINE, HYDROmorphone (DILAUDID) injection, labetalol, magnesium citrate, magnesium sulfate bolus IVPB, metoprolol tartrate, morphine injection, ondansetron, oxyCODONE, oxyCODONE, phenol, polyethylene glycol, potassium chloride, senna-docusate   Infusions:   sodium chloride Stopped (10/16/22 1701)   ampicillin-sulbactam (UNASYN) IV 3 g (10/17/22 1630)   magnesium sulfate bolus IVPB      Antimicrobials: Anti-infectives (From admission, onward)    Start     Dose/Rate Route Frequency Ordered Stop   10/15/22 0745  ceFAZolin (ANCEF) IVPB 2g/100 mL premix        2 g 200 mL/hr over 30 Minutes Intravenous On call to O.R. 10/15/22 1610 10/15/22 0907   10/15/22 0711  ceFAZolin (ANCEF) 2-4 GM/100ML-% IVPB       Note to Pharmacy: Lurena Nida: cabinet  override      10/15/22 0711 10/15/22 0837   10/14/22 1600  Ampicillin-Sulbactam (UNASYN) 3 g in sodium chloride 0.9 % 100 mL IVPB        3 g 200 mL/hr over 30 Minutes Intravenous Every 6 hours 10/14/22 1008     10/13/22 0600  ceFAZolin (ANCEF) IVPB 2g/100 mL premix        2 g 200 mL/hr over 30 Minutes Intravenous On call to O.R. 10/13/22 0207 10/13/22 1124   10/11/22 2100  metroNIDAZOLE (FLAGYL) tablet 500 mg  Status:  Discontinued        500 mg Oral Every 12 hours 10/11/22 1121 10/11/22 1856   10/11/22 2000  metroNIDAZOLE (FLAGYL) IVPB 500 mg  Status:  Discontinued        500 mg 100 mL/hr over 60 Minutes Intravenous 2 times daily 10/11/22 1856 10/14/22 1008   10/11/22 1600  cefTRIAXone (ROCEPHIN) 2 g in sodium chloride 0.9 % 100 mL  IVPB  Status:  Discontinued        2 g 200 mL/hr over 30 Minutes Intravenous Every 24 hours 10/11/22 0958 10/14/22 1008   10/10/22 1345  ceFAZolin (ANCEF) IVPB 2g/100 mL premix  Status:  Discontinued        2 g 200 mL/hr over 30 Minutes Intravenous Every 8 hours 10/10/22 1341 10/10/22 1528   10/10/22 1045  ceFAZolin (ANCEF) IVPB 2g/100 mL premix  Status:  Discontinued        2 g 200 mL/hr over 30 Minutes Intravenous On call to O.R. 10/10/22 0947 10/10/22 1013   10/10/22 0955  metroNIDAZOLE (FLAGYL) 500 MG/100ML IVPB       Note to Pharmacy: Launa Flight M: cabinet override      10/10/22 0955 10/10/22 1115   10/09/22 2100  vancomycin (VANCOREADY) IVPB 1250 mg/250 mL  Status:  Discontinued        1,250 mg 166.7 mL/hr over 90 Minutes Intravenous Every 24 hours 10/09/22 1604 10/11/22 0957   10/09/22 1800  ceFEPIme (MAXIPIME) 2 g in sodium chloride 0.9 % 100 mL IVPB  Status:  Discontinued        2 g 200 mL/hr over 30 Minutes Intravenous Every 8 hours 10/09/22 1616 10/11/22 0957   10/09/22 0600  ceFEPIme (MAXIPIME) 2 g in sodium chloride 0.9 % 100 mL IVPB  Status:  Discontinued        2 g 200 mL/hr over 30 Minutes Intravenous Every 12 hours 10/08/22 2103 10/09/22 1616   10/08/22 2103  vancomycin variable dose per unstable renal function (pharmacist dosing)  Status:  Discontinued         Does not apply See admin instructions 10/08/22 2103 10/09/22 1604   10/08/22 2015  vancomycin (VANCOREADY) IVPB 2000 mg/400 mL        2,000 mg 200 mL/hr over 120 Minutes Intravenous  Once 10/08/22 2005 10/08/22 2250   10/08/22 1800  ceFEPIme (MAXIPIME) 2 g in sodium chloride 0.9 % 100 mL IVPB        2 g 200 mL/hr over 30 Minutes Intravenous  Once 10/08/22 1748 10/08/22 1916   10/08/22 1745  metroNIDAZOLE (FLAGYL) IVPB 500 mg  Status:  Discontinued        500 mg 100 mL/hr over 60 Minutes Intravenous Every 12 hours 10/08/22 1736 10/11/22 1121       Nutritional status:  Body mass index is 31.91 kg/m.   Nutrition Problem: Increased nutrient needs Etiology: wound healing, post-op healing Signs/Symptoms: estimated needs  Objective: Vitals:   10/17/22 0744 10/17/22 1445  BP: (!) 152/86 120/74  Pulse: (!) 108 (!) 101  Resp:    Temp: 98.4 F (36.9 C) (!) 97.5 F (36.4 C)  SpO2: 93% 94%    Intake/Output Summary (Last 24 hours) at 10/17/2022 1809 Last data filed at 10/17/2022 1300 Gross per 24 hour  Intake 627.18 ml  Output 1700 ml  Net -1072.82 ml    Filed Weights   10/08/22 1708 10/08/22 1947 10/15/22 0733  Weight: 97.5 kg 97.5 kg 98 kg   Weight change:  Body mass index is 31.91 kg/m.   Physical Exam: General exam: Patient is obese.  Patient is awake and alert.  Patient is able to receive and process medical information, hence, patient has capacity to make medical decisions.   HEENT: Patient is pale.  No jaundice.   Lungs: Clear to auscultation CVS: S1-S2.   GI/Abd soft and nontender. CNS: Awake and alert.   Extremities: S/p right BKA.  Wound VAC in place  Data Review: I have personally reviewed the laboratory data and studies available.  F/u labs ordered Unresulted Labs (From admission, onward)    None       Total time spent in review of labs and imaging, patient evaluation, formulation of plan, documentation and communication with family:  Signed, Barnetta Chapel, MD [facility] 10/17/2022

## 2022-10-18 ENCOUNTER — Other Ambulatory Visit: Payer: Self-pay

## 2022-10-18 DIAGNOSIS — E1143 Type 2 diabetes mellitus with diabetic autonomic (poly)neuropathy: Secondary | ICD-10-CM

## 2022-10-18 DIAGNOSIS — K3184 Gastroparesis: Secondary | ICD-10-CM

## 2022-10-18 LAB — GLUCOSE, CAPILLARY
Glucose-Capillary: 200 mg/dL — ABNORMAL HIGH (ref 70–99)
Glucose-Capillary: 172 mg/dL — ABNORMAL HIGH (ref 70–99)
Glucose-Capillary: 171 mg/dL — ABNORMAL HIGH (ref 70–99)
Glucose-Capillary: 156 mg/dL — ABNORMAL HIGH (ref 70–99)

## 2022-10-18 LAB — C-REACTIVE PROTEIN: CRP: 4.1 mg/dL — ABNORMAL HIGH (ref <1.0)

## 2022-10-18 NOTE — Progress Notes (Signed)
Patient ID: Carlos [name], male   DOB: Jul 01, 1975, 47 y.o.   MRN: [id number] Patient is status post transtibial amputation.  The dry dressing is clean and dry.  I have ordered a C-reactive protein.  Patient has no complaints this morning.  Discussed with the patient the importance of continuing antibiotics for his recent sepsis.

## 2022-10-18 NOTE — Progress Notes (Signed)
PT Cancellation Note  Patient Details Name: Carlos [name] MRN: [id number] DOB: 1975-11-09   Cancelled Treatment:    Reason Eval/Treat Not Completed: Other (comment)  Politely declined PT session due to nausea;  Will continue to follow;   Van Clines, PT  Acute Rehabilitation Services Office [phone]    Levi Aland 10/18/2022, 3:21 PM

## 2022-10-18 NOTE — Progress Notes (Signed)
Nutrition Follow-up  DOCUMENTATION CODES:   Obesity unspecified  INTERVENTION:  Discontinue Juven and ProSource Plus Encourage use of Vitamin C and Zinc supplementation as ordered to aid in wound healing Encourage adequate PO intake with emphasis on protein "Gastroparesis Nutrition Therapy" handout added to AVS Referral placed for ongoing outpatient nutrition education   NUTRITION DIAGNOSIS:   Increased nutrient needs related to wound healing, post-op healing as evidenced by estimated needs. - remains applicable  GOAL:   Patient will meet greater than or equal to 90% of their needs - goal unmet  MONITOR:   PO intake, Supplement acceptance, Labs, Weight trends  REASON FOR ASSESSMENT:   Malnutrition Screening Tool    ASSESSMENT:   Pt admitted with gangrene of R foot. PMH significant for DM, gastroparesis, CVA, MRSA cellulitis (2021).  5/5 - s/p R transtibial amputation 5/8 - revision of R BKA 5/10 - revision of R BKA  Noted plans for possible d/c to CIR.   Pt sitting on EOB at time of visit. He did not eat breakfast this morning and reports having acid reflux and is trying not to throw up. He does not feel like he could eat anything at this time.  Pt reports that PTA he was having a hard time eating 3 meals per day d/t a h/o of gastroparesis which he reports he has had for about 4 years. Provided therapeutic listening as pt recalls having a very difficulty journey with this diagnosis. He states that he cannot tolerate many foods. He typically eats grilled chicken or fish with olive oil and chicken. He does not tolerate fiber containing foods, high-fat foods or dairy foods. These all make him nauseous and throw up. He has tried Glucerna previously but does not tolerate these, he considered trying Ensure but is afraid to consume these. He sticks to "safe foods" that he knows he can tolerate and does not try many new foods for concern over intolerance.   Prior to his  diagnosis of gastroparesis he recalls being seen by a [place] RD outpatient and was taking classes and working 1:1 with a RD. He found this to be helpful. He feels discouraged by advice he has received by medical providers since gastroparesis diagnosis. Encouraged pt to follow up with outpatient RD on a regular basis for ongoing nutrition education and support. Handout for gastroparesis nutrition information added to AVS for home use in the meantime.  Pt has not been consuming Juven or ProSource as these make him sick with emesis.  Will discontinue these for now. Encouraged pt to try taking vitamin c and zinc, can crush with apple sauce if needed for better tolerance. Discussed ordering double protein portions with all meals however pt states "not right now."  Medications: Vitamin C, colace, SSI 0-15 units TID, SSI 0-5 units qhs, semglee 30 units qhs, protonix, zinc (pt refusing all medications) IV drips: abx  Labs: CBG's 172-202 x24 hours, HgbA1c 10.4%,  Diet Order:   Diet Order             Diet Carb Modified Fluid consistency: Thin; Room service appropriate? Yes  Diet effective now                   EDUCATION NEEDS:   No education needs have been identified at this time  Skin:  Skin Assessment: Skin Integrity Issues: Skin Integrity Issues:: Incisions Wound Vac: R leg Incisions: R leg (Closed)  Last BM:  5/12 (type 7)  Height:   Ht Readings  from Last 1 Encounters:  10/15/22 5\' 9"  (1.753 m)    Weight:   Wt Readings from Last 1 Encounters:  10/15/22 98 kg    Ideal Body Weight:  72.7 kg  BMI:  Body mass index is 31.91 kg/m.  Estimated Nutritional Needs:   Kcal:  2000-2200  Protein:  110-125g  Fluid:  >/=2L  Drusilla Kanner, RDN, LDN Clinical Nutrition

## 2022-10-18 NOTE — Hospital Course (Signed)
Patient is a 47 year old male with past medical history of with past medical history of untreated diabetes mellitus type 2, gastroparesis, CVA, MRSA cellulitis 2021 presented to hospital with progressive swelling and blackening of the right big toe.  He was initially seen at the urgent care center and prescribed a course of doxycycline but could not tolerate it due to nausea and vomiting and then presented to the ED with fever chills.  Initially patient had temperature of 102.6 F with tachycardia of 126.Carlos [name]  Patient had edematous right great toe with weeping, foul order and eschar.  Initial labs showed  WBC count 27.7, lactic acid 3.3>7.4>2.5. CT right foot with contrast showed extensive subcutaneous and intramuscular gas within the intrinsic musculature of the right foot as well as the terminal visualized right lower extremity musculature, findings suggestive of necrotizing fasciitis.  Acute osteomyelitis of first metatarsal head.  Blood culture obtained on admission grew Streptococcus.  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  TEE negative for vegetation.  Patient has been intermittently resistant to insulin regimen and IV antibiotic.  On wound VAC to the right BKA stump.   Severe sepsis - POA Necrotizing fasciitis of right lower extremity S/p right BKA -5/5 Dr. Lajoyce Corners; revision 5/8 & 5/10. Streptococcal bacteremia Patient presented with a right foot wound with gas gangrene and necrotizing fasciitis.  Blood culture with Streptococcus. TTE/TEE negative for vegetation.      ID on board.  Continue IV Unasyn.   AKI Lactic acidosis Due to hypoperfusion.  Creatinine today a 0.7.  Has improved at this time.     Acute hyponatremia Improved from initial 123.  Sodium today at 133.  Presented with low level of 123.   Likely secondary to secondary to volume depletion.     Elevated troponin Demand ischemia likely secondary to sepsis and infection.   No cardiac symptoms at this point.   Type 2 diabetes mellitus uncontrolled with hyperglycemia Latest hemoglobin A1c 10.4 on 10/08/2022.  Presented with a blood sugar level elevated over 500 recent at home.  Currently on insulin regimen with sliding scale insulin.  Encouraged compliance to insulin.  Has been intermittently refusing insulin treatment.   Diabetic gastroparesis On medications at home.  Complains of nausea and eating.  Refused Reglan.   H/o CVA Not on any meds

## 2022-10-18 NOTE — Progress Notes (Signed)
Carlos [name] PROGRESS NOTE    Carlos [name]  QIO:[id number] DOB: [birth year] DOA: 10/08/2022 PCP: System, Provider Not In    Brief Narrative:  Patient is a 47 year old male with past medical history of with past medical history of untreated diabetes mellitus type 2, gastroparesis, CVA, MRSA cellulitis 2021 presented to hospital with progressive swelling and blackening of the right big toe.  He was initially seen at the urgent care center and prescribed a course of doxycycline but could not tolerate it due to nausea and vomiting and then presented to the ED with fever chills.  Initially patient had temperature of 102.6 F with tachycardia of 126.Carlos [name]  Patient had edematous right great toe with weeping, foul order and eschar.  Initial labs showed  WBC count 27.7, lactic acid 3.3>7.4>2.5. CT right foot with contrast showed extensive subcutaneous and intramuscular gas within the intrinsic musculature of the right foot as well as the terminal visualized right lower extremity musculature, findings suggestive of necrotizing fasciitis.  Acute osteomyelitis of first metatarsal head.  Blood culture obtained on admission grew Streptococcus.  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  TEE negative for vegetation.  Patient has been intermittently resistant to insulin regimen and IV antibiotic.    Assessment and plan  Severe sepsis - POA Necrotizing fasciitis of right lower extremity S/p right BKA -5/5 Dr. Lajoyce Corners; revision 5/8 & 5/10. Streptococcal bacteremia Patient presented with a right foot wound with gas gangrene and necrotizing fasciitis.  Patient underwent right BKA on 10/10/2022 with revision on 518 510.  Blood culture with Streptococcus. TTE/TEE negative for vegetation.      ID on board.  Continue IV Unasyn.  Follow orthopedic and ID recommendations.  CRP mildly elevated.  PT OT has seen the patient at this time and recommended CIR.   AKI Lactic acidosis Due to  hypoperfusion.  Creatinine today a 0.7.  Has improved at this time.     Acute hyponatremia Improved from initial 123.  Sodium today at 133.  Presented with low level of 123.   Likely secondary to secondary to volume depletion.  Check BMP in AM.   Elevated troponin Demand ischemia likely secondary to sepsis and infection.  No cardiac symptoms at this point.   Type 2 diabetes mellitus uncontrolled with hyperglycemia Latest hemoglobin A1c 10.4 on 10/08/2022.  Presented with a blood sugar level elevated over 500 recently at home.  Currently on insulin regimen with sliding scale insulin.  Encouraged compliance to insulin.  Has been intermittently refusing insulin treatment.  Counseled the need for adequate compliance.   Diabetic gastroparesis On medications at home.  Complains of nausea and eating.  Refused Reglan.   H/o CVA Not on any meds      DVT prophylaxis: SCD's Start: 10/15/22 1019 SCD's Start: 10/13/22 1248 SCD's Start: 10/10/22 1342 heparin injection 5,000 Units Start: 10/09/22 2200   Code Status:     Code Status: Full Code  Disposition: Likely to CIR as per PT OT recommendation  Status is: Inpatient  Remains inpatient appropriate because: Status post amputation, IV antibiotic,   Family Communication: None at bedside  Consultants:  Orthopedics Infectious disease  Procedures:  S/p right BKA -5/5 Dr. Lajoyce Corners; revision 5/8 & 5/10. TEE  Antimicrobials:  Unasyn IV  Anti-infectives (From admission, onward)    Start     Dose/Rate Route Frequency Ordered Stop   10/15/22 0745  ceFAZolin (ANCEF) IVPB 2g/100 mL premix  2 g 200 mL/hr over 30 Minutes Intravenous On call to O.R. 10/15/22 0734 10/15/22 0907   10/15/22 0711  ceFAZolin (ANCEF) 2-4 GM/100ML-% IVPB       Note to Pharmacy: Jamelle Rushing, GRETA: cabinet override      10/15/22 0711 10/15/22 0837   10/14/22 1600  Ampicillin-Sulbactam (UNASYN) 3 g in sodium chloride 0.9 % 100 mL IVPB        3 g 200 mL/hr over 30  Minutes Intravenous Every 6 hours 10/14/22 1008     10/13/22 0600  ceFAZolin (ANCEF) IVPB 2g/100 mL premix        2 g 200 mL/hr over 30 Minutes Intravenous On call to O.R. 10/13/22 0207 10/13/22 1124   10/11/22 2100  metroNIDAZOLE (FLAGYL) tablet 500 mg  Status:  Discontinued        500 mg Oral Every 12 hours 10/11/22 1121 10/11/22 1856   10/11/22 2000  metroNIDAZOLE (FLAGYL) IVPB 500 mg  Status:  Discontinued        500 mg 100 mL/hr over 60 Minutes Intravenous 2 times daily 10/11/22 1856 10/14/22 1008   10/11/22 1600  cefTRIAXone (ROCEPHIN) 2 g in sodium chloride 0.9 % 100 mL IVPB  Status:  Discontinued        2 g 200 mL/hr over 30 Minutes Intravenous Every 24 hours 10/11/22 0958 10/14/22 1008   10/10/22 1345  ceFAZolin (ANCEF) IVPB 2g/100 mL premix  Status:  Discontinued        2 g 200 mL/hr over 30 Minutes Intravenous Every 8 hours 10/10/22 1341 10/10/22 1528   10/10/22 1045  ceFAZolin (ANCEF) IVPB 2g/100 mL premix  Status:  Discontinued        2 g 200 mL/hr over 30 Minutes Intravenous On call to O.R. 10/10/22 0947 10/10/22 1013   10/10/22 0955  metroNIDAZOLE (FLAGYL) 500 MG/100ML IVPB       Note to Pharmacy: Launa Flight M: cabinet override      10/10/22 0955 10/10/22 1115   10/09/22 2100  vancomycin (VANCOREADY) IVPB 1250 mg/250 mL  Status:  Discontinued        1,250 mg 166.7 mL/hr over 90 Minutes Intravenous Every 24 hours 10/09/22 1604 10/11/22 0957   10/09/22 1800  ceFEPIme (MAXIPIME) 2 g in sodium chloride 0.9 % 100 mL IVPB  Status:  Discontinued        2 g 200 mL/hr over 30 Minutes Intravenous Every 8 hours 10/09/22 1616 10/11/22 0957   10/09/22 0600  ceFEPIme (MAXIPIME) 2 g in sodium chloride 0.9 % 100 mL IVPB  Status:  Discontinued        2 g 200 mL/hr over 30 Minutes Intravenous Every 12 hours 10/08/22 2103 10/09/22 1616   10/08/22 2103  vancomycin variable dose per unstable renal function (pharmacist dosing)  Status:  Discontinued         Does not apply See admin  instructions 10/08/22 2103 10/09/22 1604   10/08/22 2015  vancomycin (VANCOREADY) IVPB 2000 mg/400 mL        2,000 mg 200 mL/hr over 120 Minutes Intravenous  Once 10/08/22 2005 10/08/22 2250   10/08/22 1800  ceFEPIme (MAXIPIME) 2 g in sodium chloride 0.9 % 100 mL IVPB        2 g 200 mL/hr over 30 Minutes Intravenous  Once 10/08/22 1748 10/08/22 1916   10/08/22 1745  metroNIDAZOLE (FLAGYL) IVPB 500 mg  Status:  Discontinued        500 mg 100 mL/hr over 60 Minutes Intravenous Every  12 hours 10/08/22 1736 10/11/22 1121      Subjective: Today, patient was seen and examined at bedside.  Complains of mild.  Discomfort but no nausea vomiting fever chills or rigor.  Discussed about compliance to insulin and medications.  Objective: Vitals:   10/17/22 1445 10/17/22 2110 10/18/22 0424 10/18/22 0720  BP: 120/74 114/76 (!) 163/88 (!) 166/92  Pulse: (!) 101 (!) 102 96 (!) 102  Resp:  18 19   Temp: (!) 97.5 F (36.4 C) 98.1 F (36.7 C) 98.5 F (36.9 C) 97.7 F (36.5 C)  TempSrc: Oral Oral  Oral  SpO2: 94% 100% 99% 93%  Weight:      Height:        Intake/Output Summary (Last 24 hours) at 10/18/2022 1045 Last data filed at 10/18/2022 0844 Gross per 24 hour  Intake --  Output 650 ml  Net -650 ml   Filed Weights   10/08/22 1708 10/08/22 1947 10/15/22 0733  Weight: 97.5 kg 97.5 kg 98 kg    Physical Examination: Body mass index is 31.91 kg/m.  General: Obese not in obvious distress HENT:   Mild pallor noted.  Oral mucosa is moist.  Chest:  Clear breath sounds.  Diminished breath sounds bilaterally. No crackles or wheezes.  CVS: S1 &S2 heard. No murmur.  Regular rate and rhythm. Abdomen: Soft, nontender, nondistended.  Bowel sounds are heard.   Extremities: Status post right BKA with dressing. Psych: Alert, awake and oriented, normal mood CNS:  No cranial nerve deficits.  Power equal in all extremities.   Skin: Warm and dry.  No rashes noted.  Data Reviewed:   CBC: Recent Labs   Lab 10/12/22 0222 10/14/22 0153 10/16/22 0227  WBC 20.2* 14.0* 16.6*  NEUTROABS 15.9* 10.0* 12.3*  HGB 8.8* 9.0* 8.7*  HCT 25.9* 27.9* 27.2*  MCV 88.1 92.1 92.2  PLT 322 383 431*    Basic Metabolic Panel: Recent Labs  Lab 10/12/22 0222 10/13/22 0202 10/14/22 0153 10/16/22 0227 10/17/22 0256  NA 128* 132* 133* 131* 133*  K 3.8 4.0 3.8 4.2 4.0  CL 98 99 102 98 99  CO2 24 24 24 24 25   GLUCOSE 233* 160* 119* 257* 233*  BUN 16 10 7 8  5*  CREATININE 0.99 0.79 0.67 0.83 0.71  CALCIUM 7.1* 7.4* 7.3* 7.2* 7.6*  MG  --  1.9  --   --   --   PHOS  --   --   --   --  2.0*    Liver Function Tests: Recent Labs  Lab 10/17/22 0256  ALBUMIN 1.6*     Radiology Studies: No results found.    LOS: 10 days    Joycelyn Das, MD [facility] Available via Epic secure chat 7am-7pm After these hours, please refer to coverage provider listed on [email or web address] 10/18/2022, 10:45 AM

## 2022-10-18 NOTE — Progress Notes (Signed)
Subjective:  Patient is feeling a bit better  Antibiotics:  Anti-infectives (From admission, onward)    Start     Dose/Rate Route Frequency Ordered Stop   10/15/22 0745  ceFAZolin (ANCEF) IVPB 2g/100 mL premix        2 g 200 mL/hr over 30 Minutes Intravenous On call to O.R. 10/15/22 0734 10/15/22 0907   10/15/22 0711  ceFAZolin (ANCEF) 2-4 GM/100ML-% IVPB       Note to Pharmacy: Carlos [name], Carlos [name]: cabinet override      10/15/22 0711 10/15/22 0837   10/14/22 1600  Ampicillin-Sulbactam (UNASYN) 3 g in sodium chloride 0.9 % 100 mL IVPB        3 g 200 mL/hr over 30 Minutes Intravenous Every 6 hours 10/14/22 1008 11/12/22 2359   10/13/22 0600  ceFAZolin (ANCEF) IVPB 2g/100 mL premix        2 g 200 mL/hr over 30 Minutes Intravenous On call to O.R. 10/13/22 0207 10/13/22 1124   10/11/22 2100  metroNIDAZOLE (FLAGYL) tablet 500 mg  Status:  Discontinued        500 mg Oral Every 12 hours 10/11/22 1121 10/11/22 1856   10/11/22 2000  metroNIDAZOLE (FLAGYL) IVPB 500 mg  Status:  Discontinued        500 mg 100 mL/hr over 60 Minutes Intravenous 2 times daily 10/11/22 1856 10/14/22 1008   10/11/22 1600  cefTRIAXone (ROCEPHIN) 2 g in sodium chloride 0.9 % 100 mL IVPB  Status:  Discontinued        2 g 200 mL/hr over 30 Minutes Intravenous Every 24 hours 10/11/22 0958 10/14/22 1008   10/10/22 1345  ceFAZolin (ANCEF) IVPB 2g/100 mL premix  Status:  Discontinued        2 g 200 mL/hr over 30 Minutes Intravenous Every 8 hours 10/10/22 1341 10/10/22 1528   10/10/22 1045  ceFAZolin (ANCEF) IVPB 2g/100 mL premix  Status:  Discontinued        2 g 200 mL/hr over 30 Minutes Intravenous On call to O.R. 10/10/22 0947 10/10/22 1013   10/10/22 0955  metroNIDAZOLE (FLAGYL) 500 MG/100ML IVPB       Note to Pharmacy: Carlos [name]: cabinet override      10/10/22 0955 10/10/22 1115   10/09/22 2100  vancomycin (VANCOREADY) IVPB 1250 mg/250 mL  Status:  Discontinued        1,250 mg 166.7 mL/hr over 90  Minutes Intravenous Every 24 hours 10/09/22 1604 10/11/22 0957   10/09/22 1800  ceFEPIme (MAXIPIME) 2 g in sodium chloride 0.9 % 100 mL IVPB  Status:  Discontinued        2 g 200 mL/hr over 30 Minutes Intravenous Every 8 hours 10/09/22 1616 10/11/22 0957   10/09/22 0600  ceFEPIme (MAXIPIME) 2 g in sodium chloride 0.9 % 100 mL IVPB  Status:  Discontinued        2 g 200 mL/hr over 30 Minutes Intravenous Every 12 hours 10/08/22 2103 10/09/22 1616   10/08/22 2103  vancomycin variable dose per unstable renal function (pharmacist dosing)  Status:  Discontinued         Does not apply See admin instructions 10/08/22 2103 10/09/22 1604   10/08/22 2015  vancomycin (VANCOREADY) IVPB 2000 mg/400 mL        2,000 mg 200 mL/hr over 120 Minutes Intravenous  Once 10/08/22 2005 10/08/22 2250   10/08/22 1800  ceFEPIme (MAXIPIME) 2 g in sodium chloride 0.9 % 100 mL IVPB  2 g 200 mL/hr over 30 Minutes Intravenous  Once 10/08/22 1748 10/08/22 1916   10/08/22 1745  metroNIDAZOLE (FLAGYL) IVPB 500 mg  Status:  Discontinued        500 mg 100 mL/hr over 60 Minutes Intravenous Every 12 hours 10/08/22 1736 10/11/22 1121       Medications: Scheduled Meds:  (feeding supplement) PROSource Plus  30 mL Oral TID BM   vitamin C  1,000 mg Oral Daily   docusate sodium  100 mg Oral Daily   heparin  5,000 Units Subcutaneous Q8H   insulin aspart  0-15 Units Subcutaneous TID WC   insulin aspart  0-5 Units Subcutaneous QHS   insulin glargine-yfgn  30 Units Subcutaneous QHS   insulin starter kit- pen needles  1 kit Other Once   living well with diabetes book   Does not apply Once   nutrition supplement (JUVEN)  1 packet Oral BID BM   pantoprazole  40 mg Oral Daily   zinc sulfate  220 mg Oral Daily   Continuous Infusions:  sodium chloride Stopped (10/16/22 1701)   ampicillin-sulbactam (UNASYN) IV 3 g (10/18/22 1020)   magnesium sulfate bolus IVPB     PRN Meds:.acetaminophen, alum & mag hydroxide-simeth,  bisacodyl, guaiFENesin-dextromethorphan, hydrALAZINE, HYDROmorphone (DILAUDID) injection, labetalol, magnesium citrate, magnesium sulfate bolus IVPB, metoprolol tartrate, morphine injection, ondansetron, oxyCODONE, oxyCODONE, phenol, polyethylene glycol, potassium chloride, senna-docusate    Objective: Weight change:   Intake/Output Summary (Last 24 hours) at 10/18/2022 1213 Last data filed at 10/18/2022 0844 Gross per 24 hour  Intake --  Output 650 ml  Net -650 ml    Blood pressure (!) 166/92, pulse (!) 102, temperature 97.7 F (36.5 C), temperature source Oral, resp. rate 19, height 5\' 9"  (1.753 [name]), weight 98 kg, SpO2 93 %. Temp:  [97.5 F (36.4 C)-98.5 F (36.9 C)] 97.7 F (36.5 C) (05/13 0720) Pulse Rate:  [96-102] 102 (05/13 0720) Resp:  [18-19] 19 (05/13 0424) BP: (114-166)/(74-92) 166/92 (05/13 0720) SpO2:  [93 %-100 %] 93 % (05/13 0720)  Physical Exam: Physical Exam Constitutional:      Appearance: He is well-developed.  HENT:     Head: Normocephalic and atraumatic.  Eyes:     Conjunctiva/sclera: Conjunctivae normal.  Cardiovascular:     Rate and Rhythm: Normal rate and regular rhythm.  Pulmonary:     Effort: Pulmonary effort is normal. No respiratory distress.     Breath sounds: No wheezing.  Abdominal:     General: There is no distension.     Palpations: Abdomen is soft.  Musculoskeletal:        General: Normal range of motion.     Cervical back: Normal range of motion and neck supple.  Skin:    General: Skin is warm and dry.     Findings: No erythema or rash.  Neurological:     General: No focal deficit present.     Mental Status: He is alert and oriented to person, place, and time.  Psychiatric:        Mood and Affect: Mood normal.        Behavior: Behavior normal.        Thought Content: Thought content normal.        Judgment: Judgment normal.      CBC:    BMET Recent Labs    10/16/22 0227 10/17/22 0256  NA 131* 133*  K 4.2 4.0  CL 98  99  CO2 24 25  GLUCOSE 257* 233*  BUN 8 5*  CREATININE 0.83 0.71  CALCIUM 7.2* 7.6*      Liver Panel  Recent Labs    10/17/22 0256  ALBUMIN 1.6*       Sedimentation Rate No results for input(s): "ESRSEDRATE" in the last 72 hours. C-Reactive Protein Recent Labs    10/18/22 0731  CRP 4.1*    Micro Results: Recent Results (from the past 720 hour(s))  Culture, blood (Routine x 2)     Status: Abnormal   Collection Time: 10/08/22  5:15 PM   Specimen: BLOOD  Result Value Ref Range Status   Specimen Description BLOOD RIGHT FOOT  Final   Special Requests   Final    BOTTLES DRAWN AEROBIC AND ANAEROBIC Blood Culture results may not be optimal due to an inadequate volume of blood received in culture bottles   Culture  Setup Time   Final    GRAM POSITIVE COCCI IN BOTH AEROBIC AND ANAEROBIC BOTTLES CRITICAL VALUE NOTED.  VALUE IS CONSISTENT WITH PREVIOUSLY REPORTED AND CALLED VALUE.    Culture (A)  Final    STREPTOCOCCUS CONSTELLATUS CRITICAL RESULT CALLED TO, READ BACK BY AND VERIFIED WITH: PHARMD Carlos P. Performed at [facility], [address] [place], [place] [address]    Report Status 10/13/2022 FINAL  Final   Organism ID, Bacteria STREPTOCOCCUS CONSTELLATUS  Final      Susceptibility   Streptococcus constellatus - MIC*    PENICILLIN <=0.06 SENSITIVE Sensitive     CEFTRIAXONE <=0.12 SENSITIVE Sensitive     ERYTHROMYCIN <=0.12 SENSITIVE Sensitive     LEVOFLOXACIN <=0.25 SENSITIVE Sensitive     VANCOMYCIN 0.25 SENSITIVE Sensitive     * STREPTOCOCCUS CONSTELLATUS  Culture, blood (Routine x 2)     Status: Abnormal   Collection Time: 10/08/22  5:19 PM   Specimen: BLOOD  Result Value Ref Range Status   Specimen Description BLOOD RIGHT ANTECUBITAL  Final   Special Requests   Final    BOTTLES DRAWN AEROBIC AND ANAEROBIC Blood Culture adequate volume   Culture  Setup Time   Final    IN BOTH AEROBIC AND ANAEROBIC BOTTLES GRAM POSITIVE COCCI Organism ID to  follow CRITICAL RESULT CALLED TO, READ BACK BY AND VERIFIED WITH:  C/ PHARMD Carlos [name] 10/09/22 1608 Carlos [name] Performed at [facility], [address] [place], [place] [address]    Culture STREPTOCOCCUS MITIS/ORALIS (A)  Final   Report Status 10/11/2022 FINAL  Final   Organism ID, Bacteria STREPTOCOCCUS MITIS/ORALIS  Final      Susceptibility   Streptococcus mitis/oralis - MIC*    TETRACYCLINE >=16 RESISTANT Resistant     VANCOMYCIN 0.5 SENSITIVE Sensitive     CLINDAMYCIN 0.5 INTERMEDIATE Intermediate     PENICILLIN Value in next row Sensitive      SENSITIVEMIC <=/ 0.06    * STREPTOCOCCUS MITIS/ORALIS  Blood Culture ID Panel (Reflexed)     Status: Abnormal   Collection Time: 10/08/22  5:19 PM  Result Value Ref Range Status   Enterococcus faecalis NOT DETECTED NOT DETECTED Final   Enterococcus Faecium NOT DETECTED NOT DETECTED Final   Listeria monocytogenes NOT DETECTED NOT DETECTED Final   Staphylococcus species NOT DETECTED NOT DETECTED Final   Staphylococcus aureus (BCID) NOT DETECTED NOT DETECTED Final   Staphylococcus epidermidis NOT DETECTED NOT DETECTED Final   Staphylococcus lugdunensis NOT DETECTED NOT DETECTED Final   Streptococcus species DETECTED (A) NOT DETECTED Final    Comment: Not Enterococcus species, Streptococcus  agalactiae, Streptococcus pyogenes, or Streptococcus pneumoniae. CRITICAL RESULT CALLED TO, READ BACK BY AND VERIFIED WITH:  C/ PHARMD Carlos [name] 10/09/22 1608 Carlos [name]    Streptococcus agalactiae NOT DETECTED NOT DETECTED Final   Streptococcus pneumoniae NOT DETECTED NOT DETECTED Final   Streptococcus pyogenes NOT DETECTED NOT DETECTED Final   Carloscalcoaceticus-baumannii NOT DETECTED NOT DETECTED Final   Bacteroides fragilis NOT DETECTED NOT DETECTED Final   Enterobacterales NOT DETECTED NOT DETECTED Final   Enterobacter cloacae complex NOT DETECTED NOT DETECTED Final   Escherichia coli NOT DETECTED NOT DETECTED Final   Klebsiella  aerogenes NOT DETECTED NOT DETECTED Final   Klebsiella oxytoca NOT DETECTED NOT DETECTED Final   Klebsiella pneumoniae NOT DETECTED NOT DETECTED Final   Proteus species NOT DETECTED NOT DETECTED Final   Salmonella species NOT DETECTED NOT DETECTED Final   Serratia marcescens NOT DETECTED NOT DETECTED Final   Haemophilus influenzae NOT DETECTED NOT DETECTED Final   Neisseria meningitidis NOT DETECTED NOT DETECTED Final   Pseudomonas aeruginosa NOT DETECTED NOT DETECTED Final   Stenotrophomonas maltophilia NOT DETECTED NOT DETECTED Final   Candida albicans NOT DETECTED NOT DETECTED Final   Candida auris NOT DETECTED NOT DETECTED Final   Candida glabrata NOT DETECTED NOT DETECTED Final   Candida krusei NOT DETECTED NOT DETECTED Final   Candida parapsilosis NOT DETECTED NOT DETECTED Final   Candida tropicalis NOT DETECTED NOT DETECTED Final   Cryptococcus neoformans/gattii NOT DETECTED NOT DETECTED Final    Comment: Performed at [facility], [address], [place], [place] [address]  Surgical pcr screen     Status: Abnormal   Collection Time: 10/10/22 10:57 AM   Specimen: Nasal Mucosa; Nasal Swab  Result Value Ref Range Status   MRSA, PCR NEGATIVE NEGATIVE Final   Staphylococcus aureus POSITIVE (A) NEGATIVE Final    Comment: RESULT CALLED TO, READ BACK BY AND VERIFIED WITH: 10/10/22 AT 1228 TO RN Carlos [name], ADC (NOTE) The Xpert SA Assay (FDA approved for NASAL specimens in patients 25 years of age and older), is one component of a comprehensive surveillance program. It is not intended to diagnose infection nor to guide or monitor treatment. Performed at [facility], [address], [place], [place] [address]   Aerobic/Anaerobic Culture w Gram Stain (surgical/deep wound)     Status: None   Collection Time: 10/10/22 11:33 AM   Specimen: Leg, Right; Tissue  Result Value Ref Range Status   Specimen Description TISSUE ABSCESS  Final   Special Requests RIGHT CALF,  FLAGYL  Final   Gram Stain ABUNDANT WBC SEEN ABUNDANT GRAM POSITIVE COCCI   Final   Culture   Final    RARE STREPTOCOCCUS MITIS/ORALIS NO ANAEROBES ISOLATED Performed at [facility], [address], [place], [place] [address]    Report Status 10/17/2022 FINAL  Final   Organism ID, Bacteria STREPTOCOCCUS MITIS/ORALIS  Final      Susceptibility   Streptococcus mitis/oralis - MIC*    TETRACYCLINE >=16 RESISTANT Resistant     VANCOMYCIN 0.5 SENSITIVE Sensitive     CLINDAMYCIN <=0.25 SENSITIVE Sensitive     PENICILLIN Value in next row Sensitive      SENSITIVEMIC =0.06S    * RARE STREPTOCOCCUS MITIS/ORALIS  Culture, blood (Routine X 2) w Reflex to ID Panel     Status: None   Collection Time: 10/11/22 10:31 AM   Specimen: BLOOD RIGHT HAND  Result Value Ref Range Status   Specimen Description BLOOD RIGHT  HAND  Final   Special Requests   Final    BOTTLES DRAWN AEROBIC AND ANAEROBIC Blood Culture adequate volume   Culture   Final    NO GROWTH 5 DAYS Performed at [facility], [address], [place], [place] [address]    Report Status 10/16/2022 FINAL  Final  Culture, blood (Routine X 2) w Reflex to ID Panel     Status: None   Collection Time: 10/11/22 10:41 AM   Specimen: BLOOD RIGHT HAND  Result Value Ref Range Status   Specimen Description BLOOD RIGHT HAND  Final   Special Requests   Final    BOTTLES DRAWN AEROBIC ONLY Blood Culture results may not be optimal due to an inadequate volume of blood received in culture bottles   Culture   Final    NO GROWTH 5 DAYS Performed at [facility], [address], [place], [place] [address]    Report Status 10/16/2022 FINAL  Final  Aerobic/Anaerobic Culture w Gram Stain (surgical/deep wound)     Status: None (Preliminary result)   Collection Time: 10/13/22 11:05 AM   Specimen: Leg, Right; Tissue  Result Value Ref Range Status   Specimen Description TISSUE  Final   Special Requests NONE  Final   Gram Stain   Final     RARE WBC PRESENT, PREDOMINANTLY MONONUCLEAR NO ORGANISMS SEEN    Culture   Final    NO GROWTH 4 DAYS NO ANAEROBES ISOLATED; CULTURE IN PROGRESS FOR 5 DAYS Performed at [facility], [address] [place], [place] [address]    Report Status PENDING  Incomplete    Studies/Results: [place] EKG SITE RITE  Result Date: 10/18/2022 If Site Rite image not attached, placement could not be confirmed due to current cardiac rhythm.     Assessment/Plan:  INTERVAL HISTORY:   Pt to likely dc to CIR  Principal Problem:   Diabetic foot ulcer (HCC) Active Problems:   Diabetes (HCC)   Severe sepsis (HCC)   Hyponatremia   Lactic acidosis   AKI (acute kidney injury) (HCC)   Gastroparesis   Elevated troponin   H/O: CVA (cerebrovascular accident)   Gangrene of right foot (HCC)   Cellulitis of right lower extremity   Necrotizing fasciitis of lower leg (HCC)    Carlos [name] is a 47 y.o. 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 He is grew operative site has grown Streptococcus mitis oralis   #1 Streptococcal bacteremia due to gangrene and necrotizing fascitis:   We will continue unasyn for now while in house and then change to ceftriaxone to complete 4 weeks of postoperative IV antibiotics  Please reach out to Sharin Mons, Pharm D or Georgina Pillion, [place] D prior to DC so we can set up OPAT orders   Lexx Etzel has an appointment on 11/09/2022 at 3PM with Dr. Daiva Eves at  [facility], which  is located in the [facility] at  [address] in [place].  [address], which is located to the left of the elevators.  Phone:  [phone]  Fax: [phone]  [email or web address]  The patient should arrive 30 minutes prior to their  appoitment.  I have personally spent 50 minutes involved in face-to-face and non-face-to-face activities for this patient on the day of the visit. Professional time spent includes the following activities: Preparing to see the patient (review of tests), Obtaining and/or reviewing separately obtained history (admission/discharge record), Performing a medically appropriate examination and/or evaluation , Ordering medications/tests/procedures, referring and communicating with other health care professionals, Documenting clinical information in the EMR, Independently interpreting results (not separately reported), Communicating results to the patient/family/caregiver, Counseling and educating the patient/family/caregiver and Care coordination (not separately reported).   I will sign out for now.  Please call with further questions.    LOS: 10 days   Acey Lav 10/18/2022, 12:13 PM

## 2022-10-18 NOTE — Progress Notes (Signed)
Inpatient Rehab Admissions Coordinator:    I met with pt. To discuss potential CIR admit. He remains interested and looks to be ready to d/c soon. Cost estimate for self pay patient was provided. I do not have a bed today but will follow for potential admit later this week.  Megan Salon, MS, CCC-SLP Rehab Admissions Coordinator  [phone] (celll) [phone] (office)

## 2022-10-18 NOTE — Progress Notes (Signed)
At bedside to obtain PICC consent and place.  Once I started to explain the PICC patient asked if it had to be placed today.  He stated that he really wanted to wait until closer to discharge to place PICC.  I explained to him that he has IV access at this time for his medications but if that IV was to stop working then he would have to be stuck for new one if PICC RN was unable to come shortly after that occurred.  He stated that he understood.  Also explained that if PICC nurse was not on campus and discharge was decided to occur that day, there might be a delay in his discharge.  Patient states that he understands this as well and reiterated that he wants to wait until closer to discharge to place the PICC.  Bedside RN Toni Amend updated.

## 2022-10-19 LAB — CBC
HCT: 26.8 % — ABNORMAL LOW (ref 39.0–52.0)
Hemoglobin: 8.3 g/dL — ABNORMAL LOW (ref 13.0–17.0)
MCH: 28.7 pg (ref 26.0–34.0)
MCHC: 31 g/dL (ref 30.0–36.0)
MCV: 92.7 fL (ref 80.0–100.0)
Platelets: 433 10*3/uL — ABNORMAL HIGH (ref 150–400)
RBC: 2.89 MIL/uL — ABNORMAL LOW (ref 4.22–5.81)
RDW: 14 % (ref 11.5–15.5)
WBC: 7.2 10*3/uL (ref 4.0–10.5)
nRBC: 0 % (ref 0.0–0.2)

## 2022-10-19 LAB — GLUCOSE, CAPILLARY
Glucose-Capillary: 129 mg/dL — ABNORMAL HIGH (ref 70–99)
Glucose-Capillary: 168 mg/dL — ABNORMAL HIGH (ref 70–99)
Glucose-Capillary: 143 mg/dL — ABNORMAL HIGH (ref 70–99)
Glucose-Capillary: 168 mg/dL — ABNORMAL HIGH (ref 70–99)

## 2022-10-19 LAB — BASIC METABOLIC PANEL
Anion gap: 6 (ref 5–15)
BUN: <5 mg/dL — ABNORMAL LOW (ref 6–20)
CO2: 30 mmol/L (ref 22–32)
Calcium: 8 mg/dL — ABNORMAL LOW (ref 8.9–10.3)
Chloride: 100 mmol/L (ref 98–111)
Creatinine, Ser: 0.67 mg/dL (ref 0.61–1.24)
GFR, Estimated: >60 mL/min (ref >60)
Glucose, Bld: 157 mg/dL — ABNORMAL HIGH (ref 70–99)
Potassium: 4.1 mmol/L (ref 3.5–5.1)
Sodium: 136 mmol/L (ref 135–145)

## 2022-10-19 LAB — MAGNESIUM: Magnesium: 1.5 mg/dL — ABNORMAL LOW (ref 1.7–2.4)

## 2022-10-19 MED ORDER — MAGNESIUM OXIDE -MG SUPPLEMENT 400 (240 MG) MG PO TABS: 400.0000 mg | ORAL_TABLET | Freq: Two times a day (BID) | ORAL | Status: DC

## 2022-10-19 NOTE — Progress Notes (Signed)
Physical Therapy Treatment Patient Details Name: Carlos [name] MRN: [id number] DOB: [birth year] Today's Date: 10/19/2022   History of Present Illness 47 y.o. male presented to [facility] ED 10/08/22 with complaint of progressive swelling and blackening of right big toe for 3 weeks. 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  PMH significant for untreated DM2, gastroparesis, CVA, MRSA cellulitis 2021.    PT Comments    Patient resting in bed at start of session and reporting fatigued from earlier session with OT. Encouraged pt to participate in bed exercises for LE strengthening and pt agreeable. Exercises focused on hip and quad strengthening for bil LE's with no increase in LE pain/discomfort. EOS pt able to boost superiorly with use of head board and moved into partial chair position. Will continue to progress pt as able.    Recommendations for follow up therapy are one component of a multi-disciplinary discharge planning process, led by the attending physician.  Recommendations may be updated based on patient status, additional functional criteria and insurance authorization.  Follow Up Recommendations       Assistance Recommended at Discharge Frequent or constant Supervision/Assistance  Patient can return home with the following A lot of help with walking and/or transfers;A little help with bathing/dressing/bathroom   Equipment Recommendations  Rolling walker (2 wheels);BSC/3in1;Wheelchair (measurements PT);Wheelchair cushion (measurements PT)    Recommendations for Other Services Rehab consult     Precautions / Restrictions Precautions Precautions: Fall Precaution Comments: R BKA, Wound vac Restrictions Weight Bearing Restrictions: Yes RLE Weight Bearing: Non weight bearing Other Position/Activity Restrictions: Limb protector     Mobility  Bed Mobility Overal bed mobility: Modified Independent Bed Mobility: Rolling Rolling: Modified independent (Device/Increase time)          General bed mobility comments: Mod I to roll for exercises    Transfers                        Ambulation/Gait                   Stairs             Wheelchair Mobility    Modified Rankin (Stroke Patients Only)       Balance Overall balance assessment: Needs assistance Sitting-balance support: Feet supported Sitting balance-Leahy Scale: Normal Sitting balance - Comments: sitting EOB   Standing balance support: Bilateral upper extremity supported, During functional activity, Reliant on assistive device for balance Standing balance-Leahy Scale: Poor Standing balance comment: reliant on RW                            Cognition Arousal/Alertness: Awake/alert Behavior During Therapy: WFL for tasks assessed/performed Overall Cognitive Status: Within Functional Limits for tasks assessed                                          Exercises Amputee Exercises Quad Sets: AROM, Right, 10 reps Gluteal Sets: AROM, Both, 10 reps Hip ABduction/ADduction: AROM, Right, Sidelying, 20 reps, Limitations Hip Abduction/Adduction Limitations: 2x10 Straight Leg Raises: AROM, Right, 10 reps Other Exercises Other Exercises: Lt LE SL brdige, 1x 10 reps Other Exercises: educated on benefits of time prone for hip flexor stretch    General Comments        Pertinent Vitals/Pain Pain Assessment Pain Assessment: No/denies pain  Home Living                          Prior Function            PT Goals (current goals can now be found in the care plan section) Acute Rehab PT Goals PT Goal Formulation: With patient Time For Goal Achievement: 10/30/22 Potential to Achieve Goals: Good Progress towards PT goals: Progressing toward goals    Frequency    Min 5X/week      PT Plan Current plan remains appropriate    Co-evaluation              AM-PAC PT "6 Clicks" Mobility   Outcome Measure  Help needed  turning from your back to your side while in a flat bed without using bedrails?: None Help needed moving from lying on your back to sitting on the side of a flat bed without using bedrails?: A Little Help needed moving to and from a bed to a chair (including a wheelchair)?: A Little Help needed standing up from a chair using your arms (e.g., wheelchair or bedside chair)?: A Little Help needed to walk in hospital room?: A Little Help needed climbing 3-5 steps with a railing? : Total 6 Click Score: 17    End of Session   Activity Tolerance: Patient tolerated treatment well Patient left: in bed;with call bell/phone within reach;with bed alarm set;with SCD's reapplied Nurse Communication: Mobility status PT Visit Diagnosis: Unsteadiness on feet (R26.81);Other abnormalities of gait and mobility (R26.89);Difficulty in walking, not elsewhere classified (R26.2);Pain Pain - Right/Left: Right Pain - part of body: Leg     Time: 0102-7253 PT Time Calculation (min) (ACUTE ONLY): 15 min  Charges:  $Therapeutic Exercise: 8-22 mins                    Wynn Maudlin, DPT Acute Rehabilitation Services Office [phone]  10/19/22 3:20 PM

## 2022-10-19 NOTE — Progress Notes (Signed)
Subjective:   Patient had refused antibiotics with list of concerns (see below)  Antibiotics:  Anti-infectives (From admission, onward)    Start     Dose/Rate Route Frequency Ordered Stop   10/15/22 0745  ceFAZolin (ANCEF) IVPB 2g/100 mL premix        2 g 200 mL/hr over 30 Minutes Intravenous On call to O.R. 10/15/22 0734 10/15/22 0907   10/15/22 0711  ceFAZolin (ANCEF) 2-4 GM/100ML-% IVPB       Note to Pharmacy: Jamelle Rushing, GRETA: cabinet override      10/15/22 0711 10/15/22 0837   10/14/22 1600  Ampicillin-Sulbactam (UNASYN) 3 g in sodium chloride 0.9 % 100 mL IVPB        3 g 200 mL/hr over 30 Minutes Intravenous Every 6 hours 10/14/22 1008 11/12/22 2359   10/13/22 0600  ceFAZolin (ANCEF) IVPB 2g/100 mL premix        2 g 200 mL/hr over 30 Minutes Intravenous On call to O.R. 10/13/22 0207 10/13/22 1124   10/11/22 2100  metroNIDAZOLE (FLAGYL) tablet 500 mg  Status:  Discontinued        500 mg Oral Every 12 hours 10/11/22 1121 10/11/22 1856   10/11/22 2000  metroNIDAZOLE (FLAGYL) IVPB 500 mg  Status:  Discontinued        500 mg 100 mL/hr over 60 Minutes Intravenous 2 times daily 10/11/22 1856 10/14/22 1008   10/11/22 1600  cefTRIAXone (ROCEPHIN) 2 g in sodium chloride 0.9 % 100 mL IVPB  Status:  Discontinued        2 g 200 mL/hr over 30 Minutes Intravenous Every 24 hours 10/11/22 0958 10/14/22 1008   10/10/22 1345  ceFAZolin (ANCEF) IVPB 2g/100 mL premix  Status:  Discontinued        2 g 200 mL/hr over 30 Minutes Intravenous Every 8 hours 10/10/22 1341 10/10/22 1528   10/10/22 1045  ceFAZolin (ANCEF) IVPB 2g/100 mL premix  Status:  Discontinued        2 g 200 mL/hr over 30 Minutes Intravenous On call to O.R. 10/10/22 0947 10/10/22 1013   10/10/22 0955  metroNIDAZOLE (FLAGYL) 500 MG/100ML IVPB       Note to Pharmacy: Launa Flight M: cabinet override      10/10/22 0955 10/10/22 1115   10/09/22 2100  vancomycin (VANCOREADY) IVPB 1250 mg/250 mL  Status:  Discontinued         1,250 mg 166.7 mL/hr over 90 Minutes Intravenous Every 24 hours 10/09/22 1604 10/11/22 0957   10/09/22 1800  ceFEPIme (MAXIPIME) 2 g in sodium chloride 0.9 % 100 mL IVPB  Status:  Discontinued        2 g 200 mL/hr over 30 Minutes Intravenous Every 8 hours 10/09/22 1616 10/11/22 0957   10/09/22 0600  ceFEPIme (MAXIPIME) 2 g in sodium chloride 0.9 % 100 mL IVPB  Status:  Discontinued        2 g 200 mL/hr over 30 Minutes Intravenous Every 12 hours 10/08/22 2103 10/09/22 1616   10/08/22 2103  vancomycin variable dose per unstable renal function (pharmacist dosing)  Status:  Discontinued         Does not apply See admin instructions 10/08/22 2103 10/09/22 1604   10/08/22 2015  vancomycin (VANCOREADY) IVPB 2000 mg/400 mL        2,000 mg 200 mL/hr over 120 Minutes Intravenous  Once 10/08/22 2005 10/08/22 2250   10/08/22 1800  ceFEPIme (MAXIPIME) 2 g in sodium chloride  0.9 % 100 mL IVPB        2 g 200 mL/hr over 30 Minutes Intravenous  Once 10/08/22 1748 10/08/22 1916   10/08/22 1745  metroNIDAZOLE (FLAGYL) IVPB 500 mg  Status:  Discontinued        500 mg 100 mL/hr over 60 Minutes Intravenous Every 12 hours 10/08/22 1736 10/11/22 1121       Medications: Scheduled Meds:  vitamin C  1,000 mg Oral Daily   docusate sodium  100 mg Oral Daily   heparin  5,000 Units Subcutaneous Q8H   insulin aspart  0-15 Units Subcutaneous TID WC   insulin aspart  0-5 Units Subcutaneous QHS   insulin glargine-yfgn  30 Units Subcutaneous QHS   insulin starter kit- pen needles  1 kit Other Once   living well with diabetes book   Does not apply Once   magnesium oxide  400 mg Oral BID   pantoprazole  40 mg Oral Daily   zinc sulfate  220 mg Oral Daily   Continuous Infusions:  sodium chloride Stopped (10/16/22 1701)   ampicillin-sulbactam (UNASYN) IV 3 g (10/19/22 0442)   magnesium sulfate bolus IVPB     PRN Meds:.acetaminophen, alum & mag hydroxide-simeth, bisacodyl, guaiFENesin-dextromethorphan,  hydrALAZINE, HYDROmorphone (DILAUDID) injection, labetalol, magnesium citrate, magnesium sulfate bolus IVPB, metoprolol tartrate, morphine injection, ondansetron, oxyCODONE, oxyCODONE, phenol, polyethylene glycol, potassium chloride, senna-docusate    Objective: Weight change:  No intake or output data in the 24 hours ending 10/19/22 1312  Blood pressure (!) 159/101, pulse (!) 103, temperature 98 F (36.7 C), temperature source Oral, resp. rate 20, height 5\' 9"  (1.753 m), weight 98 kg, SpO2 95 %. Temp:  [97.6 F (36.4 C)-98 F (36.7 C)] 98 F (36.7 C) (05/14 0403) Pulse Rate:  [99-109] 103 (05/14 0746) Resp:  [20] 20 (05/14 0403) BP: (150-159)/(84-101) 159/101 (05/14 0746) SpO2:  [93 %-98 %] 95 % (05/14 0746)  Physical Exam: Physical Exam Constitutional:      Appearance: He is well-developed.  HENT:     Head: Normocephalic and atraumatic.  Eyes:     Conjunctiva/sclera: Conjunctivae normal.  Cardiovascular:     Rate and Rhythm: Normal rate and regular rhythm.  Pulmonary:     Effort: Pulmonary effort is normal. No respiratory distress.     Breath sounds: No wheezing.  Abdominal:     General: There is no distension.     Palpations: Abdomen is soft.  Musculoskeletal:     Cervical back: Normal range of motion and neck supple.  Skin:    General: Skin is warm and dry.     Findings: No erythema or rash.  Neurological:     General: No focal deficit present.     Mental Status: He is alert and oriented to person, place, and time.  Psychiatric:        Mood and Affect: Mood normal.        Behavior: Behavior normal.        Thought Content: Thought content normal.        Judgment: Judgment normal.      CBC:    BMET Recent Labs    10/17/22 0256 10/19/22 0327  NA 133* 136  K 4.0 4.1  CL 99 100  CO2 25 30  GLUCOSE 233* 157*  BUN 5* <5*  CREATININE 0.71 0.67  CALCIUM 7.6* 8.0*      Liver Panel  Recent Labs    10/17/22 0256  ALBUMIN 1.6*  Sedimentation Rate No results for input(s): "ESRSEDRATE" in the last 72 hours. C-Reactive Protein Recent Labs    10/18/22 0731  CRP 4.1*     Micro Results: Recent Results (from the past 720 hour(s))  Culture, blood (Routine x 2)     Status: Abnormal   Collection Time: 10/08/22  5:15 PM   Specimen: BLOOD  Result Value Ref Range Status   Specimen Description BLOOD RIGHT FOOT  Final   Special Requests   Final    BOTTLES DRAWN AEROBIC AND ANAEROBIC Blood Culture results may not be optimal due to an inadequate volume of blood received in culture bottles   Culture  Setup Time   Final    GRAM POSITIVE COCCI IN BOTH AEROBIC AND ANAEROBIC BOTTLES CRITICAL VALUE NOTED.  VALUE IS CONSISTENT WITH PREVIOUSLY REPORTED AND CALLED VALUE.    Culture (A)  Final    STREPTOCOCCUS CONSTELLATUS CRITICAL RESULT CALLED TO, READ BACK BY AND VERIFIED WITH: PHARMD AUSTIN P. Performed at [facility], [address] [place], [place] [address]    Report Status 10/13/2022 FINAL  Final   Organism ID, Bacteria STREPTOCOCCUS CONSTELLATUS  Final      Susceptibility   Streptococcus constellatus - MIC*    PENICILLIN <=0.06 SENSITIVE Sensitive     CEFTRIAXONE <=0.12 SENSITIVE Sensitive     ERYTHROMYCIN <=0.12 SENSITIVE Sensitive     LEVOFLOXACIN <=0.25 SENSITIVE Sensitive     VANCOMYCIN 0.25 SENSITIVE Sensitive     * STREPTOCOCCUS CONSTELLATUS  Culture, blood (Routine x 2)     Status: Abnormal   Collection Time: 10/08/22  5:19 PM   Specimen: BLOOD  Result Value Ref Range Status   Specimen Description BLOOD RIGHT ANTECUBITAL  Final   Special Requests   Final    BOTTLES DRAWN AEROBIC AND ANAEROBIC Blood Culture adequate volume   Culture  Setup Time   Final    IN BOTH AEROBIC AND ANAEROBIC BOTTLES GRAM POSITIVE COCCI Organism ID to follow CRITICAL RESULT CALLED TO, READ BACK BY AND VERIFIED WITH:  C/ PHARMD H. VON DOHLEN 10/09/22 1608 A. LAFRANCE Performed at [facility],  [address] [place], [place] [address]    Culture STREPTOCOCCUS MITIS/ORALIS (A)  Final   Report Status 10/11/2022 FINAL  Final   Organism ID, Bacteria STREPTOCOCCUS MITIS/ORALIS  Final      Susceptibility   Streptococcus mitis/oralis - MIC*    TETRACYCLINE >=16 RESISTANT Resistant     VANCOMYCIN 0.5 SENSITIVE Sensitive     CLINDAMYCIN 0.5 INTERMEDIATE Intermediate     PENICILLIN Value in next row Sensitive      SENSITIVEMIC <=/ 0.06    * STREPTOCOCCUS MITIS/ORALIS  Blood Culture ID Panel (Reflexed)     Status: Abnormal   Collection Time: 10/08/22  5:19 PM  Result Value Ref Range Status   Enterococcus faecalis NOT DETECTED NOT DETECTED Final   Enterococcus Faecium NOT DETECTED NOT DETECTED Final   Listeria monocytogenes NOT DETECTED NOT DETECTED Final   Staphylococcus species NOT DETECTED NOT DETECTED Final   Staphylococcus aureus (BCID) NOT DETECTED NOT DETECTED Final   Staphylococcus epidermidis NOT DETECTED NOT DETECTED Final   Staphylococcus lugdunensis NOT DETECTED NOT DETECTED Final   Streptococcus species DETECTED (A) NOT DETECTED Final    Comment: Not Enterococcus species, Streptococcus agalactiae, Streptococcus pyogenes, or Streptococcus pneumoniae. CRITICAL RESULT CALLED TO, READ BACK BY AND VERIFIED WITH:  C/ PHARMD H. VON DOHLEN 10/09/22 1608 A. LAFRANCE    Streptococcus agalactiae NOT DETECTED NOT  DETECTED Final   Streptococcus pneumoniae NOT DETECTED NOT DETECTED Final   Streptococcus pyogenes NOT DETECTED NOT DETECTED Final   A.calcoaceticus-baumannii NOT DETECTED NOT DETECTED Final   Bacteroides fragilis NOT DETECTED NOT DETECTED Final   Enterobacterales NOT DETECTED NOT DETECTED Final   Enterobacter cloacae complex NOT DETECTED NOT DETECTED Final   Escherichia coli NOT DETECTED NOT DETECTED Final   Klebsiella aerogenes NOT DETECTED NOT DETECTED Final   Klebsiella oxytoca NOT DETECTED NOT DETECTED Final   Klebsiella pneumoniae NOT DETECTED NOT DETECTED Final    Proteus species NOT DETECTED NOT DETECTED Final   Salmonella species NOT DETECTED NOT DETECTED Final   Serratia marcescens NOT DETECTED NOT DETECTED Final   Haemophilus influenzae NOT DETECTED NOT DETECTED Final   Neisseria meningitidis NOT DETECTED NOT DETECTED Final   Pseudomonas aeruginosa NOT DETECTED NOT DETECTED Final   Stenotrophomonas maltophilia NOT DETECTED NOT DETECTED Final   Candida albicans NOT DETECTED NOT DETECTED Final   Candida auris NOT DETECTED NOT DETECTED Final   Candida glabrata NOT DETECTED NOT DETECTED Final   Candida krusei NOT DETECTED NOT DETECTED Final   Candida parapsilosis NOT DETECTED NOT DETECTED Final   Candida tropicalis NOT DETECTED NOT DETECTED Final   Cryptococcus neoformans/gattii NOT DETECTED NOT DETECTED Final    Comment: Performed at [facility], [address], [place], [place] [address]  Surgical pcr screen     Status: Abnormal   Collection Time: 10/10/22 10:57 AM   Specimen: Nasal Mucosa; Nasal Swab  Result Value Ref Range Status   MRSA, PCR NEGATIVE NEGATIVE Final   Staphylococcus aureus POSITIVE (A) NEGATIVE Final    Comment: RESULT CALLED TO, READ BACK BY AND VERIFIED WITH: 10/10/22 AT 1228 TO RN Hendricks Limes, ADC (NOTE) The Xpert SA Assay (FDA approved for NASAL specimens in patients 88 years of age and older), is one component of a comprehensive surveillance program. It is not intended to diagnose infection nor to guide or monitor treatment. Performed at [facility], [address] [place], [place] [address]   Aerobic/Anaerobic Culture w Gram Stain (surgical/deep wound)     Status: None   Collection Time: 10/10/22 11:33 AM   Specimen: Leg, Right; Tissue  Result Value Ref Range Status   Specimen Description TISSUE ABSCESS  Final   Special Requests RIGHT CALF, FLAGYL  Final   Gram Stain ABUNDANT WBC SEEN ABUNDANT GRAM POSITIVE COCCI   Final   Culture   Final    RARE STREPTOCOCCUS MITIS/ORALIS NO ANAEROBES  ISOLATED Performed at [facility], [address], [place], [place] [address]    Report Status 10/17/2022 FINAL  Final   Organism ID, Bacteria STREPTOCOCCUS MITIS/ORALIS  Final      Susceptibility   Streptococcus mitis/oralis - MIC*    TETRACYCLINE >=16 RESISTANT Resistant     VANCOMYCIN 0.5 SENSITIVE Sensitive     CLINDAMYCIN <=0.25 SENSITIVE Sensitive     PENICILLIN Value in next row Sensitive      SENSITIVEMIC =0.06S    * RARE STREPTOCOCCUS MITIS/ORALIS  Culture, blood (Routine X 2) w Reflex to ID Panel     Status: None   Collection Time: 10/11/22 10:31 AM   Specimen: BLOOD RIGHT HAND  Result Value Ref Range Status   Specimen Description BLOOD RIGHT HAND  Final   Special Requests   Final    BOTTLES DRAWN AEROBIC AND ANAEROBIC Blood Culture adequate volume   Culture   Final    NO GROWTH 5  DAYS Performed at [facility], [address], [place], [place] [address]    Report Status 10/16/2022 FINAL  Final  Culture, blood (Routine X 2) w Reflex to ID Panel     Status: None   Collection Time: 10/11/22 10:41 AM   Specimen: BLOOD RIGHT HAND  Result Value Ref Range Status   Specimen Description BLOOD RIGHT HAND  Final   Special Requests   Final    BOTTLES DRAWN AEROBIC ONLY Blood Culture results may not be optimal due to an inadequate volume of blood received in culture bottles   Culture   Final    NO GROWTH 5 DAYS Performed at [facility], [address], [place], [place] [address]    Report Status 10/16/2022 FINAL  Final  Aerobic/Anaerobic Culture w Gram Stain (surgical/deep wound)     Status: None   Collection Time: 10/13/22 11:05 AM   Specimen: Leg, Right; Tissue  Result Value Ref Range Status   Specimen Description TISSUE  Final   Special Requests NONE  Final   Gram Stain   Final    RARE WBC PRESENT, PREDOMINANTLY MONONUCLEAR NO ORGANISMS SEEN    Culture   Final    No growth aerobically or anaerobically. Performed at [facility], [address], [place], [place] [address]    Report Status 10/18/2022 FINAL  Final    Studies/Results: [place] EKG SITE RITE  Result Date: 10/18/2022 If Site Rite image not attached, placement could not be confirmed due to current cardiac rhythm.     Assessment/Plan:  INTERVAL HISTORY:   Pt  has stopped antibiotics  Principal Problem:   Diabetic foot ulcer (HCC) Active Problems:   Diabetes (HCC)   Severe sepsis (HCC)   Hyponatremia   Lactic acidosis   AKI (acute kidney injury) (HCC)   Gastroparesis   Elevated troponin   H/O: CVA (cerebrovascular accident)   Gangrene of right foot (HCC)   Cellulitis of right lower extremity   Necrotizing fasciitis of lower leg (HCC)    Carlos [name] is a 47 y.o. 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 He is grew operative site has grown Streptococcus mitis oralis   The patient explained to me his reasons for refusing antibiotics today  #1 He had read that he had kidney disease on "mychart" and he also noticed "pain in my kidneys" and that his urine smell had changed and that the antibiotics were causing formation of crystals in urine that could be hurting his kidney fxn. I pointed out that he actually has NORMAL RENAL fxn based on his serum creatinine .[name] But that when he was admitted with sepsis he DID have ARF.   I assured him that the antibiotics he is currently on are quite safe and very unlikely to ever hurt his kidneys.  I also explained my concerns for residual infection that will re-emerge without proper "mop up' antibiotics and involve bones of his leg and risk him also becoming septic again.   He also did not want the PICC line "which goes  all the way to my heart" as this was not what he expected when I mentioned putting in a durable IV and it reminds  him of his father who had one and as mentioned died.  He is agreeable to oral antibiotics when he goes home though I would like him on IV antibiotics while he is here. He told me that he will think about it over next 24 hours  #1 Streptococcal bacteremia due to gangrene and necrotizing fascitis:  See above discussion and prior notes  I have personally spent 54 minutes involved in face-to-face and non-face-to-face activities for this patient on the day of the visit. Professional time spent includes the following activities: Preparing to see the patient (review of tests), Obtaining and/or reviewing separately obtained history (admission/discharge record), Performing a medically appropriate examination and/or evaluation , Ordering medications/tests/procedures, referring and communicating with other health care professionals, Documenting clinical information in the EMR, Independently interpreting results (not separately reported), Communicating results to the patient/family/caregiver, Counseling and educating the patient/family/caregiver and Care coordination (not separately reported).     LOS: 11 days   Acey Lav 10/19/2022, 1:12 PM

## 2022-10-19 NOTE — Progress Notes (Signed)
Inpatient Rehab Admissions Coordinator:   Met with patient at bedside. He is close to ready for discharge, however we have no beds available on CIR today. Hope to admit patient in next 1-2 days. Will continue to follow.   Rehab Admissons Coordinator [place], [place], [place] [phone]

## 2022-10-19 NOTE — Progress Notes (Addendum)
Patient ID: Carlos [name], male   DOB: 11/04/1975, 47 y.o.   MRN: [id number] Patient is concerned that the use of antibiotics has damaged his kidneys.  Reviewing the timeline, patient's kidney function has significantly improved from when he was septic.  Use of antibiotics has  improved his renal function.  I discussed that his renal damage is most likely secondary to his uncontrolled diabetes with a hemoglobin A1c of 10.4 and his recent sepsis from  necrotizing fasciitis.  Patient's white blood cell count is 7.2 decreased from 28.7 when he was septic.  C-reactive protein was 4.1 yesterday.  Patient states he will no longer use antibiotics.  I discussed that this is against my medical advice but will yield to his decision.  Discussed that if he becomes septic at home he would need to follow-up at the hospital immediately for further antibiotic coverage.  Patient is safe from orthopedic standpoint for discharge to home today.

## 2022-10-19 NOTE — Progress Notes (Signed)
Patient continues to refuse all scheduled medications: VTE prophylaxis, IV antibiotic and insulin therapies that are ordered despite knowing the risks. Denies any discomforts at this time.

## 2022-10-19 NOTE — Progress Notes (Signed)
[name] PROGRESS NOTE    Carlos [name]  WUJ:[id number] DOB: 07-20-1975 DOA: 10/08/2022 PCP: System, Provider Not In    Brief Narrative:   Patient is a 47 year old male with past medical history of with past medical history of untreated diabetes mellitus type 2, gastroparesis, CVA, MRSA cellulitis 2021 presented to hospital with progressive swelling and blackening of the right big toe.  He was initially seen at the urgent care center and prescribed a course of doxycycline but could not tolerate it due to nausea and vomiting and then presented to the ED with fever chills.  Initially patient had temperature of 102.6 F with tachycardia of 126.[name]  Patient had edematous right great toe with weeping, foul order and eschar.  Initial labs showed  WBC count 27.7, lactic acid 3.3>7.4>2.5. CT right foot with contrast showed extensive subcutaneous and intramuscular gas within the intrinsic musculature of the right foot as well as the terminal visualized right lower extremity musculature, findings suggestive of necrotizing fasciitis, acute osteomyelitis of first metatarsal head.  Blood culture obtained on admission grew Streptococcus.  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  TEE negative for vegetation.  Patient has been intermittently resistant to insulin regimen and IV antibiotic.    Assessment and plan  Severe sepsis - POA Necrotizing fasciitis of right lower extremity S/p right BKA -5/5 Dr. Lajoyce Corners; revision 5/8 & 5/10. Streptococcal bacteremia Patient presented with a right foot wound with gas gangrene and necrotizing fasciitis.  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  Blood culture with Streptococcus. TTE/TEE negative for vegetation.    ID on board.  On IV Unasyn, plan to continue until 11/12/2022.  Orthopedics following the patient.  At this time patient does not wish to continue with PICC line and IV antibiotics.  Patient  believes that antibiotics will damage his kidneys and does not want to take it despite understanding the benefit risk profile.  CRP mildly elevated.  PT OT has seen the patient at this time and recommended CIR.   AKI/Lactic acidosis Due to hypoperfusion.  Creatinine today a 0.6.  Has improved at this time.    Hypomagnesemia will replenish orally.   Acute hyponatremia Improved at this time.  Latest sodium level of 136.   Elevated troponin Demand ischemia likely secondary to sepsis and infection.  No cardiac symptoms at this point.   Type 2 diabetes mellitus uncontrolled with hyperglycemia Latest hemoglobin A1c 10.4 on 10/08/2022.  Presented with a blood sugar level elevated over 500 recently at home.  Currently on insulin regimen with sliding scale insulin.  Encouraged compliance to insulin but patient does not wish to continue insulin as per him.  Diabetic gastroparesis On medications at home.  Refused Reglan.   H/o CVA Not on any meds      DVT prophylaxis: SCD's Start: 10/15/22 1019 SCD's Start: 10/13/22 1248 SCD's Start: 10/10/22 1342 heparin injection 5,000 Units Start: 10/09/22 2200   Code Status:     Code Status: Full Code  Disposition: Likely to CIR when bed available.  Medically stable at this time  Status is: Inpatient  Remains inpatient appropriate because: Status post amputation, need for CIR.   Family Communication: None at bedside  Consultants:  Orthopedics Infectious disease  Procedures:  S/p right BKA -5/5 Dr. Lajoyce Corners; revision 5/8 & 5/10. TEE  Antimicrobials:  Unasyn IV   Subjective: Today, patient was seen and examined observed patient denies any  vomiting, fever chills or rigor.  Patient states that he does not wish to continue antibiotics and insulin despite knowing the risk,  complains of mild foot discomfort.  Objective: Vitals:   10/18/22 1620 10/18/22 2015 10/19/22 0403 10/19/22 0746  BP: (!) 151/84 (!) 150/85 (!) 151/93 (!) 159/101  Pulse:  99  (!) 109 (!) 103  Resp:   20   Temp: 97.6 F (36.4 C) 98 F (36.7 C) 98 F (36.7 C)   TempSrc: Oral Oral Oral Oral  SpO2: 93% 98% 96% 95%  Weight:      Height:       No intake or output data in the 24 hours ending 10/19/22 1236  Filed Weights   10/08/22 1708 10/08/22 1947 10/15/22 0733  Weight: 97.5 kg 97.5 kg 98 kg    Physical Examination: Body mass index is 31.91 kg/m.   General: Obese not in obvious distress alert awake and Communicative HENT:   Mild pallor noted.  Oral mucosa is moist.  Chest:  Clear breath sounds.  No crackles or wheezes.  CVS: S1 &S2 heard. No murmur.  Regular rate and rhythm. Abdomen: Soft, nontender, nondistended.  Bowel sounds are heard.   Extremities: Status post right BKA with dressing. Psych: Alert, awake and oriented, normal mood CNS:  No cranial nerve deficits.  Power equal in all extremities.   Skin: Warm and dry.   Data Reviewed:   CBC: Recent Labs  Lab 10/14/22 0153 10/16/22 0227 10/19/22 0327  WBC 14.0* 16.6* 7.2  NEUTROABS 10.0* 12.3*  --   HGB 9.0* 8.7* 8.3*  HCT 27.9* 27.2* 26.8*  MCV 92.1 92.2 92.7  PLT 383 431* 433*     Basic Metabolic Panel: Recent Labs  Lab 10/13/22 0202 10/14/22 0153 10/16/22 0227 10/17/22 0256 10/19/22 0327  NA 132* 133* 131* 133* 136  K 4.0 3.8 4.2 4.0 4.1  CL 99 102 98 99 100  CO2 24 24 24 25 30   GLUCOSE 160* 119* 257* 233* 157*  BUN 10 7 8  5* <5*  CREATININE 0.79 0.67 0.83 0.71 0.67  CALCIUM 7.4* 7.3* 7.2* 7.6* 8.0*  MG 1.9  --   --   --  1.5*  PHOS  --   --   --  2.0*  --      Liver Function Tests: Recent Labs  Lab 10/17/22 0256  ALBUMIN 1.6*      Radiology Studies: [place] EKG SITE RITE  Result Date: 10/18/2022 If Site Rite image not attached, placement could not be confirmed due to current cardiac rhythm.     LOS: 11 days    Joycelyn Das, MD [facility] Available via Epic secure chat 7am-7pm After these hours, please refer to coverage provider listed on  [email or web address] 10/19/2022, 12:36 PM

## 2022-10-19 NOTE — Progress Notes (Signed)
Occupational Therapy Treatment Patient Details Name: Carlos [name] MRN: [id number] DOB: [birth year] Today's Date: 10/19/2022   History of present illness 47 y.o. male presented to [facility] ED 10/08/22 with complaint of progressive swelling and blackening of right big toe for 3 weeks. 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  PMH significant for untreated DM2, gastroparesis, CVA, MRSA cellulitis 2021.   OT comments  Pt motivated to participate today, able to perform all activities, requires frequent rest breaks with standing/ambulation with RW. Pt min guard for transfers and ambulation, good safety awareness, tires easily increasing fall risk after ambulation more than 10 feet. Pt instructed on donning/doffing RLE limb guard, able to with set up. Pt requires assistance with LB dressing, not able to stand at sink and perform ADLs due to decreased balance and fatigue, uses chair by sink. Pt would greatly benefit from continued skilled therapy, DC rec still appropriate   Recommendations for follow up therapy are one component of a multi-disciplinary discharge planning process, led by the attending physician.  Recommendations may be updated based on patient status, additional functional criteria and insurance authorization.    Assistance Recommended at Discharge Frequent or constant Supervision/Assistance  Patient can return home with the following  A lot of help with walking and/or transfers;A lot of help with bathing/dressing/bathroom;Assistance with cooking/housework;Help with stairs or ramp for entrance;Assist for transportation   Equipment Recommendations   (defer, but Pt did request WC, RW, and tub bench)    Recommendations for Other Services      Precautions / Restrictions Precautions Precautions: Fall Precaution Comments: R BKA, Wound vac Restrictions Weight Bearing Restrictions: Yes RLE Weight Bearing: Non weight bearing Other Position/Activity Restrictions: Limb protector        Mobility Bed Mobility Overal bed mobility: Modified Independent                  Transfers Overall transfer level: Needs assistance Equipment used: Rolling walker (2 wheels) Transfers: Sit to/from Stand Sit to Stand: Min guard Stand pivot transfers: Min guard   Step pivot transfers: Min guard     General transfer comment: increased effort, use of RW and min guard for safety/balance     Balance Overall balance assessment: Needs assistance Sitting-balance support: Feet supported Sitting balance-Leahy Scale: Normal Sitting balance - Comments: sitting EOB   Standing balance support: Bilateral upper extremity supported, During functional activity, Reliant on assistive device for balance Standing balance-Leahy Scale: Poor Standing balance comment: reliant on RW                           ADL either performed or assessed with clinical judgement   ADL Overall ADL's : Needs assistance/impaired     Grooming: Set up;Sitting Grooming Details (indicate cue type and reason):  (chair at sink)         Upper Body Dressing : Set up;Sitting Upper Body Dressing Details (indicate cue type and reason): EOB Lower Body Dressing: Maximal assistance;Sit to/from stand   Toilet Transfer: Stand-pivot;Rolling walker (2 wheels);Min guard Statistician Details (indicate cue type and reason): min guard to toilet from EOB Toileting- Clothing Manipulation and Hygiene: Moderate assistance;Sitting/lateral lean       Functional mobility during ADLs: Min guard;Rolling walker (2 wheels) General ADL Comments: Pt able to perform mobility min guard, tires easily, needs break. Pt able to lean on sink and perform some ADLs standing, but requires use of chair due to decreased endurance and overall balance without  BUE support.    Extremity/Trunk Assessment Upper Extremity Assessment Upper Extremity Assessment: Overall WFL for tasks assessed            Vision        Perception     Praxis      Cognition Arousal/Alertness: Awake/alert Behavior During Therapy: WFL for tasks assessed/performed Overall Cognitive Status: Within Functional Limits for tasks assessed                                          Exercises Exercises: Other exercises Other Exercises Other Exercises: sit to stands to improve strenth, endurance, and overall balance. 3X5, 30 seconds each set, too tired to continue after ADLs, mobilty, and this exercise    Shoulder Instructions       General Comments      Pertinent Vitals/ Pain       Pain Assessment Pain Assessment: No/denies pain  Home Living                                          Prior Functioning/Environment              Frequency  Min 2X/week        Progress Toward Goals  OT Goals(current goals can now be found in the care plan section)  Progress towards OT goals: Progressing toward goals  Acute Rehab OT Goals Patient Stated Goal: to improve strength and return home OT Goal Formulation: With patient Time For Goal Achievement: 10/30/22 Potential to Achieve Goals: Good ADL Goals Pt Will Perform Grooming: with modified independence;sitting Pt Will Perform Upper Body Bathing: with modified independence;sitting Pt Will Perform Lower Body Bathing: with min guard assist;sit to/from stand;sitting/lateral leans Pt Will Perform Lower Body Dressing: with min guard assist;sitting/lateral leans;sit to/from stand;with adaptive equipment Pt Will Transfer to Toilet: with supervision;stand pivot transfer;bedside commode Pt Will Perform Toileting - Clothing Manipulation and hygiene: with supervision;sit to/from stand;sitting/lateral leans Pt/caregiver will Perform Home Exercise Program: Increased strength;Both right and left upper extremity;With theraband;Independently;With written HEP provided Additional ADL Goal #1: Pt will demonstrate knowledge of limb protector donning/doffing  and strategies to reduce phantom limb sensation with mod I.  Plan Discharge plan remains appropriate    Co-evaluation                 AM-PAC OT "6 Clicks" Daily Activity     Outcome Measure   Help from another person eating meals?: None Help from another person taking care of personal grooming?: A Little Help from another person toileting, which includes using toliet, bedpan, or urinal?: A Lot Help from another person bathing (including washing, rinsing, drying)?: A Lot Help from another person to put on and taking off regular upper body clothing?: A Little Help from another person to put on and taking off regular lower body clothing?: A Lot 6 Click Score: 16    End of Session Equipment Utilized During Treatment: Gait belt;Rolling walker (2 wheels)  OT Visit Diagnosis: Unsteadiness on feet (R26.81);Muscle weakness (generalized) (M62.81)   Activity Tolerance Patient tolerated treatment well   Patient Left in bed;with call bell/phone within reach   Nurse Communication Mobility status        Time: 5621-3086 OT Time Calculation (min): 25 min  Charges: OT General Charges $OT Visit: 1 Visit  OT Treatments $Self Care/Home Management : 8-22 mins $Therapeutic Activity: 8-22 mins  Clydell Sposito, OTR/L   Alexis Goodell 10/19/2022, 1:23 PM

## 2022-10-20 ENCOUNTER — Other Ambulatory Visit: Payer: Self-pay

## 2022-10-20 ENCOUNTER — Inpatient Hospital Stay (HOSPITAL_COMMUNITY)
Admission: RE | Admit: 2022-10-20 | Discharge: 2022-10-27 | DRG: 560 | Disposition: A | Discharge status: Home / Self Care | Payer: Medicaid Other | Source: Intra-hospital | Attending: Physical Medicine and Rehabilitation | Admitting: Physical Medicine and Rehabilitation

## 2022-10-20 ENCOUNTER — Encounter (HOSPITAL_COMMUNITY): Payer: Self-pay | Admitting: Physical Medicine and Rehabilitation

## 2022-10-20 DIAGNOSIS — Z4781 Encounter for orthopedic aftercare following surgical amputation: Secondary | ICD-10-CM | POA: Diagnosis present

## 2022-10-20 DIAGNOSIS — Z91148 Patient's other noncompliance with medication regimen for other reason: Secondary | ICD-10-CM

## 2022-10-20 DIAGNOSIS — M726 Necrotizing fasciitis: Secondary | ICD-10-CM

## 2022-10-20 DIAGNOSIS — E669 Obesity, unspecified: Secondary | ICD-10-CM | POA: Diagnosis present

## 2022-10-20 DIAGNOSIS — H9202 Otalgia, left ear: Secondary | ICD-10-CM | POA: Diagnosis not present

## 2022-10-20 DIAGNOSIS — D638 Anemia in other chronic diseases classified elsewhere: Secondary | ICD-10-CM | POA: Diagnosis present

## 2022-10-20 DIAGNOSIS — T8741 Infection of amputation stump, right upper extremity: Secondary | ICD-10-CM

## 2022-10-20 DIAGNOSIS — Z794 Long term (current) use of insulin: Secondary | ICD-10-CM

## 2022-10-20 DIAGNOSIS — Z560 Unemployment, unspecified: Secondary | ICD-10-CM | POA: Diagnosis not present

## 2022-10-20 DIAGNOSIS — F4329 Adjustment disorder with other symptoms: Secondary | ICD-10-CM | POA: Diagnosis not present

## 2022-10-20 DIAGNOSIS — B954 Other streptococcus as the cause of diseases classified elsewhere: Secondary | ICD-10-CM | POA: Diagnosis present

## 2022-10-20 DIAGNOSIS — S88111D Complete traumatic amputation at level between knee and ankle, right lower leg, subsequent encounter: Secondary | ICD-10-CM | POA: Diagnosis not present

## 2022-10-20 DIAGNOSIS — E0852 Diabetes mellitus due to underlying condition with diabetic peripheral angiopathy with gangrene: Secondary | ICD-10-CM

## 2022-10-20 DIAGNOSIS — E1151 Type 2 diabetes mellitus with diabetic peripheral angiopathy without gangrene: Secondary | ICD-10-CM | POA: Diagnosis present

## 2022-10-20 DIAGNOSIS — E119 Type 2 diabetes mellitus without complications: Secondary | ICD-10-CM | POA: Diagnosis not present

## 2022-10-20 DIAGNOSIS — Z5329 Procedure and treatment not carried out because of patient's decision for other reasons: Secondary | ICD-10-CM | POA: Diagnosis not present

## 2022-10-20 DIAGNOSIS — F54 Psychological and behavioral factors associated with disorders or diseases classified elsewhere: Secondary | ICD-10-CM

## 2022-10-20 DIAGNOSIS — E1165 Type 2 diabetes mellitus with hyperglycemia: Secondary | ICD-10-CM

## 2022-10-20 DIAGNOSIS — Z6833 Body mass index (BMI) 33.0-33.9, adult: Secondary | ICD-10-CM

## 2022-10-20 DIAGNOSIS — Z89511 Acquired absence of right leg below knee: Secondary | ICD-10-CM | POA: Diagnosis not present

## 2022-10-20 DIAGNOSIS — K3184 Gastroparesis: Secondary | ICD-10-CM | POA: Diagnosis present

## 2022-10-20 DIAGNOSIS — E871 Hypo-osmolality and hyponatremia: Secondary | ICD-10-CM | POA: Diagnosis present

## 2022-10-20 DIAGNOSIS — Z79899 Other long term (current) drug therapy: Secondary | ICD-10-CM

## 2022-10-20 DIAGNOSIS — F4320 Adjustment disorder, unspecified: Secondary | ICD-10-CM | POA: Diagnosis present

## 2022-10-20 DIAGNOSIS — Z8673 Personal history of transient ischemic attack (TIA), and cerebral infarction without residual deficits: Secondary | ICD-10-CM

## 2022-10-20 DIAGNOSIS — K219 Gastro-esophageal reflux disease without esophagitis: Secondary | ICD-10-CM | POA: Diagnosis present

## 2022-10-20 DIAGNOSIS — E1143 Type 2 diabetes mellitus with diabetic autonomic (poly)neuropathy: Secondary | ICD-10-CM | POA: Diagnosis present

## 2022-10-20 DIAGNOSIS — S88111S Complete traumatic amputation at level between knee and ankle, right lower leg, sequela: Secondary | ICD-10-CM

## 2022-10-20 DIAGNOSIS — K5900 Constipation, unspecified: Secondary | ICD-10-CM | POA: Diagnosis present

## 2022-10-20 DIAGNOSIS — E1152 Type 2 diabetes mellitus with diabetic peripheral angiopathy with gangrene: Secondary | ICD-10-CM

## 2022-10-20 DIAGNOSIS — R7881 Bacteremia: Secondary | ICD-10-CM | POA: Diagnosis present

## 2022-10-20 LAB — GLUCOSE, CAPILLARY
Glucose-Capillary: 180 mg/dL — ABNORMAL HIGH (ref 70–99)
Glucose-Capillary: 146 mg/dL — ABNORMAL HIGH (ref 70–99)
Glucose-Capillary: 127 mg/dL — ABNORMAL HIGH (ref 70–99)
Glucose-Capillary: 125 mg/dL — ABNORMAL HIGH (ref 70–99)

## 2022-10-20 LAB — CBC
HCT: 26.4 % — ABNORMAL LOW (ref 39.0–52.0)
Hemoglobin: 8.4 g/dL — ABNORMAL LOW (ref 13.0–17.0)
MCH: 29.7 pg (ref 26.0–34.0)
MCHC: 31.8 g/dL (ref 30.0–36.0)
MCV: 93.3 fL (ref 80.0–100.0)
Platelets: 375 10*3/uL (ref 150–400)
RBC: 2.83 MIL/uL — ABNORMAL LOW (ref 4.22–5.81)
RDW: 14.4 % (ref 11.5–15.5)
WBC: 7 10*3/uL (ref 4.0–10.5)
nRBC: 0 % (ref 0.0–0.2)

## 2022-10-20 LAB — BASIC METABOLIC PANEL
Anion gap: 7 (ref 5–15)
BUN: 5 mg/dL — ABNORMAL LOW (ref 6–20)
CO2: 30 mmol/L (ref 22–32)
Calcium: 8 mg/dL — ABNORMAL LOW (ref 8.9–10.3)
Chloride: 97 mmol/L — ABNORMAL LOW (ref 98–111)
Creatinine, Ser: 0.78 mg/dL (ref 0.61–1.24)
GFR, Estimated: >60 mL/min (ref >60)
Glucose, Bld: 194 mg/dL — ABNORMAL HIGH (ref 70–99)
Potassium: 4.4 mmol/L (ref 3.5–5.1)
Sodium: 134 mmol/L — ABNORMAL LOW (ref 135–145)

## 2022-10-20 LAB — MAGNESIUM: Magnesium: 1.6 mg/dL — ABNORMAL LOW (ref 1.7–2.4)

## 2022-10-20 MED ORDER — TRAZODONE HCL 50 MG PO TABS: 25.0000 mg | ORAL_TABLET | Freq: Every evening | ORAL | Status: DC | PRN

## 2022-10-20 MED ORDER — INSULIN ASPART 100 UNIT/ML IJ SOLN: 0.0000 [IU] | Freq: Three times a day (TID) | INTRAMUSCULAR | 11 refills | Status: DC

## 2022-10-20 MED ORDER — INSULIN GLARGINE-YFGN 100 UNIT/ML ~~LOC~~ SOLN
30.0000 [IU] | Freq: Every day | SUBCUTANEOUS | Status: DC
  Filled 2022-10-20 (×8): qty 0.3

## 2022-10-20 MED ORDER — POTASSIUM CHLORIDE CRYS ER 20 MEQ PO TBCR: 20.0000 meq | EXTENDED_RELEASE_TABLET | Freq: Every day | ORAL | Status: DC | PRN

## 2022-10-20 MED ORDER — ASCORBIC ACID 1000 MG PO TABS: 1000.0000 mg | ORAL_TABLET | Freq: Every day | ORAL | Status: DC

## 2022-10-20 MED ORDER — INSULIN ASPART 100 UNIT/ML IJ SOLN: 0.0000 [IU] | Freq: Three times a day (TID) | INTRAMUSCULAR | Status: DC

## 2022-10-20 MED ORDER — POLYETHYLENE GLYCOL 3350 17 G PO PACK: 17.0000 g | PACK | Freq: Every day | ORAL | 0 refills | Status: DC | PRN

## 2022-10-20 MED ORDER — ALUM & MAG HYDROXIDE-SIMETH 200-200-20 MG/5ML PO SUSP
15.0000 mL | ORAL | Status: DC | PRN
Start: 2022-10-20 — End: 2022-10-20

## 2022-10-20 MED ORDER — INSULIN GLARGINE-YFGN 100 UNIT/ML ~~LOC~~ SOLN: 30.0000 [IU] | Freq: Every day | SUBCUTANEOUS | 11 refills | Status: DC

## 2022-10-20 MED ORDER — SODIUM CHLORIDE 0.9 % IV SOLN: 3.0000 g | Freq: Four times a day (QID) | INTRAVENOUS | Status: DC

## 2022-10-20 MED ORDER — ENOXAPARIN SODIUM 40 MG/0.4ML IJ SOSY
40.0000 mg | PREFILLED_SYRINGE | INTRAMUSCULAR | Status: DC
  Administered 2022-10-21 – 2022-10-26 (×4): 40 mg via SUBCUTANEOUS
  Filled 2022-10-20 (×4): qty 0.4

## 2022-10-20 MED ORDER — MAGNESIUM OXIDE -MG SUPPLEMENT 400 (240 MG) MG PO TABS: 400.0000 mg | ORAL_TABLET | Freq: Two times a day (BID) | ORAL | Status: DC

## 2022-10-20 MED ORDER — SODIUM CHLORIDE (PF) 0.9 % IJ SOLN
INTRAMUSCULAR | Status: AC
  Administered 2022-10-20: 10 mL
  Filled 2022-10-20: qty 10

## 2022-10-20 MED ORDER — FLEET ENEMA 7-19 GM/118ML RE ENEM: 1.0000 | ENEMA | Freq: Once | RECTAL | Status: DC | PRN

## 2022-10-20 MED ORDER — PANTOPRAZOLE SODIUM 40 MG IV SOLR
40.0000 mg | Freq: Once | INTRAVENOUS | Status: AC
  Administered 2022-10-20: 40 mg via INTRAVENOUS
  Filled 2022-10-20: qty 10

## 2022-10-20 MED ORDER — PROCHLORPERAZINE MALEATE 5 MG PO TABS: 5.0000 mg | ORAL_TABLET | Freq: Four times a day (QID) | ORAL | Status: DC | PRN

## 2022-10-20 MED ORDER — INSULIN ASPART 100 UNIT/ML IJ SOLN: 0.0000 [IU] | Freq: Every day | INTRAMUSCULAR | Status: DC

## 2022-10-20 MED ORDER — GUAIFENESIN-DM 100-10 MG/5ML PO SYRP: 15.0000 mL | ORAL_SOLUTION | ORAL | 0 refills | Status: DC | PRN

## 2022-10-20 MED ORDER — MORPHINE SULFATE (PF) 2 MG/ML IV SOLN: 2.0000 mg | INTRAVENOUS | 0 refills | Status: DC | PRN

## 2022-10-20 MED ORDER — PROCHLORPERAZINE 25 MG RE SUPP: 12.5000 mg | Freq: Four times a day (QID) | RECTAL | Status: DC | PRN

## 2022-10-20 MED ORDER — PANTOPRAZOLE SODIUM 40 MG PO TBEC: 40.0000 mg | DELAYED_RELEASE_TABLET | Freq: Every day | ORAL | Status: DC

## 2022-10-20 MED ORDER — SODIUM CHLORIDE 0.9 % IV SOLN
3.0000 g | Freq: Four times a day (QID) | INTRAVENOUS | Status: DC
  Filled 2022-10-20 (×28): qty 8

## 2022-10-20 MED ORDER — ONDANSETRON HCL 4 MG/2ML IJ SOLN: 4.0000 mg | Freq: Four times a day (QID) | INTRAMUSCULAR | 0 refills | Status: DC | PRN

## 2022-10-20 MED ORDER — INSULIN ASPART 100 UNIT/ML IJ SOLN: 0.0000 [IU] | Freq: Every day | INTRAMUSCULAR | 11 refills | Status: DC

## 2022-10-20 MED ORDER — BISACODYL 5 MG PO TBEC: 5.0000 mg | DELAYED_RELEASE_TABLET | Freq: Every day | ORAL | 0 refills | Status: DC | PRN

## 2022-10-20 MED ORDER — OXYCODONE HCL 5 MG PO TABS: 5.0000 mg | ORAL_TABLET | ORAL | Status: DC | PRN

## 2022-10-20 MED ORDER — ACETAMINOPHEN 325 MG PO TABS: 325.0000 mg | ORAL_TABLET | Freq: Four times a day (QID) | ORAL | Status: DC | PRN

## 2022-10-20 MED ORDER — ALUM & MAG HYDROXIDE-SIMETH 200-200-20 MG/5ML PO SUSP: 15.0000 mL | ORAL | 0 refills | Status: DC | PRN

## 2022-10-20 MED ORDER — POLYETHYLENE GLYCOL 3350 17 G PO PACK: 17.0000 g | PACK | Freq: Every day | ORAL | Status: DC | PRN

## 2022-10-20 MED ORDER — BISACODYL 10 MG RE SUPP: 10.0000 mg | Freq: Every day | RECTAL | Status: DC | PRN

## 2022-10-20 MED ORDER — DIPHENHYDRAMINE HCL 25 MG PO CAPS: 25.0000 mg | ORAL_CAPSULE | Freq: Four times a day (QID) | ORAL | Status: DC | PRN

## 2022-10-20 MED ORDER — ZINC SULFATE 220 (50 ZN) MG PO CAPS: 220.0000 mg | ORAL_CAPSULE | Freq: Every day | ORAL | Status: DC

## 2022-10-20 MED ORDER — OXYCODONE HCL 5 MG PO TABS: 10.0000 mg | ORAL_TABLET | ORAL | Status: DC | PRN

## 2022-10-20 MED ORDER — DOCUSATE SODIUM 100 MG PO CAPS: 100.0000 mg | ORAL_CAPSULE | Freq: Every day | ORAL | Status: DC

## 2022-10-20 MED ORDER — HEPARIN SODIUM (PORCINE) 5000 UNIT/ML IJ SOLN: 5000.0000 [IU] | Freq: Three times a day (TID) | INTRAMUSCULAR | Status: DC

## 2022-10-20 MED ORDER — PROCHLORPERAZINE EDISYLATE 10 MG/2ML IJ SOLN: 5.0000 mg | Freq: Four times a day (QID) | INTRAMUSCULAR | Status: DC | PRN

## 2022-10-20 MED ORDER — ACETAMINOPHEN 325 MG PO TABS: 325.0000 mg | ORAL_TABLET | ORAL | Status: DC | PRN

## 2022-10-20 MED ORDER — SENNOSIDES-DOCUSATE SODIUM 8.6-50 MG PO TABS: 1.0000 | ORAL_TABLET | Freq: Every evening | ORAL | Status: DC | PRN

## 2022-10-20 MED ORDER — OXYCODONE HCL 5 MG PO TABS: 5.0000 mg | ORAL_TABLET | ORAL | 0 refills | Status: DC | PRN

## 2022-10-20 MED ORDER — MAGNESIUM OXIDE -MG SUPPLEMENT 400 (240 MG) MG PO TABS
400.0000 mg | ORAL_TABLET | Freq: Two times a day (BID) | ORAL | Status: DC
  Filled 2022-10-20 (×2): qty 1

## 2022-10-20 MED ORDER — GUAIFENESIN-DM 100-10 MG/5ML PO SYRP: 5.0000 mL | ORAL_SOLUTION | Freq: Four times a day (QID) | ORAL | Status: DC | PRN

## 2022-10-20 MED ORDER — HYDROMORPHONE HCL 1 MG/ML IJ SOLN: 0.5000 mg | INTRAMUSCULAR | 0 refills | Status: DC | PRN

## 2022-10-20 MED ORDER — VITAMIN C 500 MG PO TABS: 1000.0000 mg | ORAL_TABLET | Freq: Every day | ORAL | Status: DC

## 2022-10-20 MED ORDER — OXYCODONE HCL 10 MG PO TABS: 10.0000 mg | ORAL_TABLET | ORAL | 0 refills | Status: DC | PRN

## 2022-10-20 MED ORDER — DOCUSATE SODIUM 100 MG PO CAPS: 100.0000 mg | ORAL_CAPSULE | Freq: Every day | ORAL | 0 refills | Status: DC

## 2022-10-20 MED ORDER — ALUM & MAG HYDROXIDE-SIMETH 200-200-20 MG/5ML PO SUSP: 30.0000 mL | ORAL | Status: DC | PRN

## 2022-10-20 NOTE — Progress Notes (Signed)
PMR Admission Coordinator Pre-Admission Assessment   Patient: Carlos [name] is an 47 y.o., male MRN: [id number] DOB: September 22, 1975 Height: 5\' 9"  (1.753 m) Weight: 97.5 kg   Insurance Information HMO:     PPO:      PCP:      IPA:      80/20:      OTHER:  PRIMARY: Uninsured    SECONDARY:       Policy#:       Phone#:    Artist:       Phone#:    The Data processing manager" for patients in Inpatient Rehabilitation Facilities with attached "Privacy Act Statement-Health Care Records" was provided and verbally reviewed with: N/A   Emergency Contact Information Contact Information       Name Relation Home Work Mobile    Montas,Nicholas Brother [phone]        First,autumn Daughter     [phone]    Silva Bandy     [phone]           Current Medical History  Patient Admitting Diagnosis: R BKA    History of Present Illness:  A 47 year old male with past medical history of diabetes mellitus, [untreated], gastroparesis, history of CVA, history of MRSA cellulitis 2021. Weeks ago patient noticed on the bottom of his right foot.  He went to urgent care the next day and was scribed doxycycline.  Doxycycline caused nausea and vomiting, shoulder statement effectively.  His wound got worse, he has been referred to the wound care.. Endorses fever, chills, nausea, vomiting, weakness, foot pain  worsening wound.  After much prompting by his children he agreed to come to the ER.  On 10/08/22 in the ER x-ray shows there are numerous pockets of air in the soft tissues in right foot and right ankle suggesting possible gas gangrene. Tmax 102.7, WBC 27.7, creatinine 2.05, glucose 474, NA 123, troponin was 63, LA 3.3. FSBS 506HR 127, RR 22, Tmax as above.  Patient bolused 2 L LR.  Blood cultures x 2 collected.  Cefepime, Flagyl and Vanco given.  Patient underwent R BKA with Dr. Lajoyce Corners on 10/10/22. Patient was seen by PT/OT post op and they recommend CIR to assist with return to  [facility].    Patient's medical record from [facility] has been reviewed by the rehabilitation admission coordinator and physician.   Past Medical History      Past Medical History:  Diagnosis Date   Diabetes mellitus        Has the patient had major surgery during 100 days prior to admission? Yes   Family History   family history is not on file.   Current Medications   Current Facility-Administered Medications:    (feeding supplement) PROSource Plus liquid 30 mL, 30 mL, Oral, TID BM, Dahal, Binaya, MD   0.9 %  sodium chloride infusion, , Intravenous, Continuous, [name], MD, Stopped at 10/10/22 0906   0.9 %  sodium chloride infusion, , Intravenous, Continuous, [name], MD, Last Rate: 75 mL/hr at 10/11/22 0630, New Bag at 10/11/22 0630   acetaminophen (TYLENOL) tablet 325-650 mg, 325-650 mg, Oral, Q6H PRN, [name], MD   alum & mag hydroxide-simeth (MAALOX/MYLANTA) 200-200-20 MG/5ML suspension 15-30 mL, 15-30 mL, Oral, Q2H PRN, [name], MD   ascorbic acid (VITAMIN C) tablet 1,000 mg, 1,000 mg, Oral, Daily, [name], MD, 1,000 mg at 10/11/22 0802   bisacodyl (DULCOLAX) EC tablet 5 mg, 5 mg,  Oral, Daily PRN, [name], MD   cefTRIAXone (ROCEPHIN) 2 g in sodium chloride 0.9 % 100 mL IVPB, 2 g, Intravenous, Q24H, Daiva Eves, Lisette Grinder, MD, Last Rate: 200 mL/hr at 10/11/22 1705, 2 g at 10/11/22 1705   docusate sodium (COLACE) capsule 100 mg, 100 mg, Oral, Daily, [name], MD, 100 mg at 10/11/22 0802   guaiFENesin-dextromethorphan (ROBITUSSIN DM) 100-10 MG/5ML syrup 15 mL, 15 mL, Oral, Q4H PRN, [name], MD   heparin injection 5,000 Units, 5,000 Units, Subcutaneous, Q8H, [name], MD, 5,000 Units at 10/11/22 2150   hydrALAZINE (APRESOLINE) injection 5 mg, 5 mg, Intravenous, Q20 Min PRN, [name], MD   HYDROmorphone (DILAUDID) injection 0.5-1 mg, 0.5-1 mg, Intravenous, Q4H PRN, [name], MD   insulin aspart  (novoLOG) injection 0-15 Units, 0-15 Units, Subcutaneous, TID WC, Dahal, Binaya, MD, 5 Units at 10/12/22 0807   insulin aspart (novoLOG) injection 0-5 Units, 0-5 Units, Subcutaneous, QHS, Dahal, Melina Schools, MD, 2 Units at 10/11/22 2145   insulin glargine-yfgn (SEMGLEE) injection 30 Units, 30 Units, Subcutaneous, QHS, Dahal, Binaya, MD, 30 Units at 10/11/22 2150   insulin starter kit- pen needles (English) 1 kit, 1 kit, Other, Once, Dahal, Melina Schools, MD   labetalol (NORMODYNE) injection 10 mg, 10 mg, Intravenous, Q10 min PRN, [name], MD   living well with diabetes book MISC, , Does not apply, Once, Dahal, Melina Schools, MD   magnesium citrate solution 1 Bottle, 1 Bottle, Oral, Once PRN, [name], MD   magnesium sulfate IVPB 2 g 50 mL, 2 g, Intravenous, Daily PRN, [name], MD   metoCLOPramide (REGLAN) injection 5 mg, 5 mg, Intravenous, Q8H, Dahal, Binaya, MD   metoprolol tartrate (LOPRESSOR) injection 2-5 mg, 2-5 mg, Intravenous, Q2H PRN, [name], MD   metroNIDAZOLE (FLAGYL) IVPB 500 mg, 500 mg, Intravenous, BID, Dahal, Binaya, MD, Last Rate: 100 mL/hr at 10/11/22 2058, 500 mg at 10/11/22 2058   morphine (PF) 2 MG/ML injection 2 mg, 2 mg, Intravenous, Q4H PRN, [name], MD   nutrition supplement (JUVEN) (JUVEN) powder packet 1 packet, 1 packet, Oral, BID BM, [name], MD, 1 packet at 10/12/22 0810   ondansetron [facility]) injection 4 mg, 4 mg, Intravenous, Q6H PRN, [name], MD   oxyCODONE (Oxy IR/ROXICODONE) immediate release tablet 10-15 mg, 10-15 mg, Oral, Q4H PRN, [name], MD   oxyCODONE (Oxy IR/ROXICODONE) immediate release tablet 5-10 mg, 5-10 mg, Oral, Q4H PRN, [name], MD   pantoprazole (PROTONIX) EC tablet 40 mg, 40 mg, Oral, Daily, [name], MD, 40 mg at 10/11/22 0802   phenol (CHLORASEPTIC) mouth spray 1 spray, 1 spray, Mouth/Throat, PRN, [name], MD   polyethylene glycol (MIRALAX / GLYCOLAX) packet 17 g, 17 g, Oral, Daily PRN, [name], MD   potassium chloride SA (KLOR-CON M) CR tablet 20-40 mEq, 20-40 mEq, Oral, Daily PRN, [name], MD   senna-docusate (Senokot-S) tablet 1 tablet, 1 tablet, Oral, QHS PRN, [name], MD   zinc sulfate capsule 220 mg, 220 mg, Oral, Daily, [name], MD, 220 mg at 10/11/22 0802   Patients Current Diet:  Diet Order                  Diet Carb Modified             Diet Carb Modified Fluid consistency: Thin; Room service appropriate? Yes  Diet effective now  Precautions / Restrictions Precautions Precautions: Fall Precaution Comments: R BKA, Wound vac Restrictions Weight Bearing Restrictions: Yes RLE Weight Bearing: Non weight bearing Other Position/Activity Restrictions: Limb protector    Has the patient had 2 or more falls or a fall with injury in the past year? No   Prior Activity Level Community (5-7x/wk): Pt working and active in the community PTA   Prior Functional Level Self Care: Did the patient need help bathing, dressing, using the toilet or eating? Independent   Indoor Mobility: Did the patient need assistance with walking from room to room (with or without device)? Independent   Stairs: Did the patient need assistance with internal or external stairs (with or without device)? Independent   Functional Cognition: Did the patient need help planning regular tasks such as shopping or remembering to take medications? Independent   Patient Information Are you of Hispanic, Latino/a,or Spanish origin?: A. No, not of Hispanic, Latino/a, or Spanish origin What is your race?: B. Black or African American Do you need or want an interpreter to communicate with a doctor or health care staff?: 0. No   Patient's Response To:  Health Literacy and Transportation Is the patient able to respond to health literacy and transportation needs?: Yes Health Literacy - How often do you need to have someone help you when you read  instructions, pamphlets, or other written material from your doctor or pharmacy?: Never In the past 12 months, has lack of transportation kept you from medical appointments or from getting medications?: No In the past 12 months, has lack of transportation kept you from meetings, work, or from getting things needed for daily living?: No   Journalist, newspaper / Equipment Home Assistive Devices/Equipment: None Home Equipment: None   Prior Device Use: Indicate devices/aids used by the patient prior to current illness, exacerbation or injury? None of the above   Current Functional Level Cognition   Overall Cognitive Status: Within Functional Limits for tasks assessed Orientation Level: Oriented X4 General Comments: Good carry over of most information learned in PT session this morning    Extremity Assessment (includes Sensation/Coordination)   Upper Extremity Assessment: Overall WFL for tasks assessed  Lower Extremity Assessment: Defer to PT evaluation RLE Deficits / Details: s/p transtibial amp. Hip and knee ROM currently WNL RLE Sensation: decreased proprioception, decreased light touch RLE Coordination: WNL     ADLs   Overall ADL's : Needs assistance/impaired Eating/Feeding: Independent, Sitting Grooming: Set up, Sitting Grooming Details (indicate cue type and reason):  (chair at sink) Upper Body Bathing: Set up, Sitting Upper Body Bathing Details (indicate cue type and reason): EOB Lower Body Bathing: Maximal assistance, Sit to/from stand Upper Body Dressing : Set up, Sitting Upper Body Dressing Details (indicate cue type and reason): EOB Lower Body Dressing: Maximal assistance, Sit to/from stand Toilet Transfer: Stand-pivot, Rolling walker (2 wheels), Min guard Toilet Transfer Details (indicate cue type and reason): min guard to toilet from EOB Toileting- Clothing Manipulation and Hygiene: Moderate assistance, Sitting/lateral lean Functional mobility during ADLs: Min guard,  Rolling walker (2 wheels) General ADL Comments: Pt able to perform mobility min guard, tires easily, needs break. Pt able to lean on sink and perform some ADLs standing, but requires use of chair due to decreased endurance and overall balance without BUE support.     Mobility   Overal bed mobility: Modified Independent Bed Mobility: Rolling Rolling: Modified independent (Device/Increase time) Supine to sit: Supervision, HOB elevated General bed mobility comments: Mod I to roll for  exercises     Transfers   Overall transfer level: Needs assistance Equipment used: Rolling walker (2 wheels) Transfers: Sit to/from Stand Sit to Stand: Min guard Bed to/from chair/wheelchair/BSC transfer type:: Step pivot Stand pivot transfers: Min guard Step pivot transfers: Min guard General transfer comment: increased effort, use of RW and min guard for safety/balance     Ambulation / Gait / Stairs / Wheelchair Mobility   Ambulation/Gait Ambulation/Gait assistance: Editor, commissioning (Feet): 5 Feet (fwd and bkwd, 2x) Assistive device: Rolling walker (2 wheels) Gait Pattern/deviations:  (hop to) General Gait Details: pt first hopped on L foot bed to chair. Then worked on taking full wt through upper body and gliding fwd and bkwd for less impact on LLE. L shoe donned before gait Gait velocity: decreased Gait velocity interpretation: <1.31 ft/sec, indicative of household ambulator Pre-gait activities: lfting L foot up and down in place with RW     Posture / Balance Dynamic Sitting Balance Sitting balance - Comments: sitting EOB Balance Overall balance assessment: Needs assistance Sitting-balance support: Feet supported Sitting balance-Leahy Scale: Normal Sitting balance - Comments: sitting EOB Standing balance support: Bilateral upper extremity supported, During functional activity, Reliant on assistive device for balance Standing balance-Leahy Scale: Poor Standing balance comment: reliant on  RW     Special needs/care consideration Skin R BKA post op site; right arm skin peeling.    Previous Home Environment (from acute therapy documentation) Living Arrangements: Children  Lives With: Daughter Available Help at Discharge: Family, Available PRN/intermittently, Friend(s) Type of Home: Apartment Home Layout: One level Home Access: Level entry Bathroom Shower/Tub: Engineer, manufacturing systems: Standard Home Care Services: No Additional Comments: daughter works and attends school but is in and out throughout the day. He also has a brother that visits daily, a fiancee, and a best friend   Discharge Living Setting Plans for Discharge Living Setting: Patient's home Type of Home at Discharge: House Discharge Home Layout: One level Discharge Home Access: Level entry Discharge Bathroom Shower/Tub: Tub/shower unit Discharge Bathroom Toilet: Standard Discharge Bathroom Accessibility: Yes How Accessible: Accessible via walker, Accessible via wheelchair Does the patient have any problems obtaining your medications?: No   Social/Family/Support Systems Patient Roles: Spouse Contact Information: [phone] Anticipated Caregiver: autumn Singley Ability/Limitations of Caregiver: min A Caregiver Availability: Intermittent Discharge Plan Discussed with Primary Caregiver: Yes Is Caregiver In Agreement with Plan?: Yes Does Caregiver/Family have Issues with Lodging/Transportation while Pt is in Rehab?: No   Goals Patient/Family Goal for Rehab: PT/OT Mod I Expected length of stay: 12-14 days Pt/Family Agrees to Admission and willing to participate: Yes Program Orientation Provided & Reviewed with Pt/Caregiver Including Roles  & Responsibilities: Yes   Decrease burden of Care through IP rehab admission: not anticipated    Possible need for SNF placement upon discharge: not anticipated   Patient Condition: I have reviewed medical records from [facility] , spoken  with CM, and patient and daughter. I met with patient at the bedside for inpatient rehabilitation assessment.  Patient will benefit from ongoing PT and OT, can actively participate in 3 hours of therapy a day 5 days of the week, and can make measurable gains during the admission.  Patient will also benefit from the coordinated team approach during an Inpatient Acute Rehabilitation admission.  The patient will receive intensive therapy as well as Rehabilitation physician, nursing, social worker, and care management interventions.  Due to safety, skin/wound care, disease management, medication administration, pain management, and patient education the  patient requires 24 hour a day rehabilitation nursing.  The patient is currently min assist 5" with mobility and max assist with basic ADLs.  Discharge setting and therapy post discharge at home with home health is anticipated.  Patient has agreed to participate in the Acute Inpatient Rehabilitation Program and will admit today.   Preadmission Screen Completed By:  Trish Mage, 10/20/2022 11:55 AM ______________________________________________________________________   Discussed status with Dr. Natale Lay on 10/20/22 at 0945 and received approval for admission today.   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 Yes Co-Morbidities requiring supervision/potential complications: AKI, hypomagnesemia, hyponatremia, DM2, diabetes gastroparesis, hx of CVA Due to bladder management, bowel management, safety, skin/wound care, disease management, medication administration, pain management, and patient education, does the patient require 24 hr/day rehab nursing? Yes Does the patient require  coordinated care of a physician, rehab nurse, PT, OT, and SLP to address physical and functional deficits in the context of the above medical diagnosis(es)? Yes Addressing deficits in the following areas: balance, endurance, locomotion, strength, transferring, bowel/bladder control, bathing, dressing, feeding, grooming, toileting, and psychosocial support Can the patient actively participate in an intensive therapy program of at least 3 hrs of therapy 5 days a week? Yes The potential for patient to make measurable gains while on inpatient rehab is excellent Anticipated functional outcomes upon discharge from inpatient rehab: modified independent PT, modified independent OT, n/a SLP Estimated rehab length of stay to reach the above functional goals is: 12-14 Anticipated discharge destination: Home 10. Overall Rehab/Functional Prognosis: good     MD Signature: Fanny Dance         Revision History

## 2022-10-20 NOTE — Progress Notes (Signed)
Physical Therapy Treatment Patient Details Name: Carlos [name] MRN: [id number] DOB: 1976-01-07 Today's Date: 10/20/2022   History of Present Illness 47 y.o. male presented to [facility] ED 10/08/22 with complaint of progressive swelling and blackening of right big toe for 3 weeks. 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  PMH significant for untreated DM2, gastroparesis, CVA, MRSA cellulitis 2021.    PT Comments    Pt received sitting EOB reporting acid reflux, but agreeable to session. Pt able to tolerate 2 standing trials and a short gait distance in the room with min guard for safety. Pt demonstrating difficulty offloading with BUEs. Therapist demonstrated hop-to technique with focus on increased BUE support with pt verbalizing understanding. However, pt unable to tolerate a second gait trial to practice technique due to acid reflux. Pt continues to benefit from PT services to progress toward functional mobility goals.     Recommendations for follow up therapy are one component of a multi-disciplinary discharge planning process, led by the attending physician.  Recommendations may be updated based on patient status, additional functional criteria and insurance authorization.     Assistance Recommended at Discharge Frequent or constant Supervision/Assistance  Patient can return home with the following A lot of help with walking and/or transfers;A little help with bathing/dressing/bathroom   Equipment Recommendations  Rolling walker (2 wheels);BSC/3in1;Wheelchair (measurements PT);Wheelchair cushion (measurements PT)    Recommendations for Other Services       Precautions / Restrictions Precautions Precautions: Fall Precaution Comments: R BKA, Wound vac Restrictions Weight Bearing Restrictions: Yes RLE Weight Bearing: Non weight bearing Other Position/Activity Restrictions: Limb protector     Mobility  Bed Mobility Overal bed mobility: Modified Independent              General bed mobility comments: Pt sitting EOB upon arrival    Transfers Overall transfer level: Needs assistance Equipment used: Rolling walker (2 wheels) Transfers: Sit to/from Stand Sit to Stand: Min guard           General transfer comment: from EOB x2 with min guard for safety    Ambulation/Gait Ambulation/Gait assistance: Min guard Gait Distance (Feet): 15 Feet Assistive device: Rolling walker (2 wheels) Gait Pattern/deviations: Trunk flexed (hop-to) Gait velocity: decreased     General Gait Details: Pt with difficulty fully offloading LLE with BUEs and hopping on LLE. This causes increased unsteadiness with turning while pivoting on LLE       Balance Overall balance assessment: Needs assistance Sitting-balance support: Feet supported Sitting balance-Leahy Scale: Normal Sitting balance - Comments: sitting EOB   Standing balance support: Bilateral upper extremity supported, During functional activity, Reliant on assistive device for balance Standing balance-Leahy Scale: Poor Standing balance comment: reliant on RW                            Cognition Arousal/Alertness: Awake/alert Behavior During Therapy: WFL for tasks assessed/performed Overall Cognitive Status: Within Functional Limits for tasks assessed                                          Exercises      General Comments        Pertinent Vitals/Pain Pain Assessment Pain Assessment: Faces Faces Pain Scale: Hurts even more Pain Location: acid reflux Pain Descriptors / Indicators: Burning, Grimacing Pain Intervention(s): Limited activity within patient's tolerance, Monitored during  session     PT Goals (current goals can now be found in the care plan section) Acute Rehab PT Goals Patient Stated Goal: to improve PT Goal Formulation: With patient Time For Goal Achievement: 10/30/22 Potential to Achieve Goals: Good Progress towards PT goals: Progressing toward  goals    Frequency    Min 5X/week      PT Plan Current plan remains appropriate       AM-PAC PT "6 Clicks" Mobility   Outcome Measure  Help needed turning from your back to your side while in a flat bed without using bedrails?: None Help needed moving from lying on your back to sitting on the side of a flat bed without using bedrails?: None Help needed moving to and from a bed to a chair (including a wheelchair)?: A Little Help needed standing up from a chair using your arms (e.g., wheelchair or bedside chair)?: A Little Help needed to walk in hospital room?: A Little Help needed climbing 3-5 steps with a railing? : Total 6 Click Score: 18    End of Session Equipment Utilized During Treatment: Gait belt Activity Tolerance: Patient tolerated treatment well Patient left: in bed;with call bell/phone within reach Nurse Communication: Mobility status PT Visit Diagnosis: Unsteadiness on feet (R26.81);Other abnormalities of gait and mobility (R26.89);Difficulty in walking, not elsewhere classified (R26.2);Pain Pain - Right/Left: Right Pain - part of body: Leg     Time: 4540-9811 PT Time Calculation (min) (ACUTE ONLY): 12 min  Charges:  $Gait Training: 8-22 mins                     Carlos [name], PTA Acute Rehabilitation Services Secure Chat Preferred  [phone]    Carlos [name] 10/20/2022, 12:07 PM

## 2022-10-20 NOTE — Progress Notes (Signed)
IP rehab admissions - awaiting decision from attending MD regarding possible acute inpatient rehab admission today.  I do have a bed available for this patient today.  [phone]

## 2022-10-20 NOTE — Plan of Care (Signed)
  Problem: Clinical Measurements: Goal: Ability to maintain clinical measurements within normal limits will improve Outcome: Progressing   Problem: Activity: Goal: Risk for activity intolerance will decrease Outcome: Progressing   Problem: Coping: Goal: Level of anxiety will decrease Outcome: Progressing   Problem: Safety: Goal: Ability to remain free from injury will improve Outcome: Progressing   

## 2022-10-20 NOTE — Progress Notes (Signed)
Subjective: No new complaints  Antibiotics:  Anti-infectives (From admission, onward)    Start     Dose/Rate Route Frequency Ordered Stop   10/20/22 1800  Ampicillin-Sulbactam (UNASYN) 3 g in sodium chloride 0.9 % 100 mL IVPB        3 g 200 mL/hr over 30 Minutes Intravenous Every 6 hours 10/20/22 1629 11/13/22 0559       Medications: Scheduled Meds:  [START ON 10/21/2022] vitamin C  1,000 mg Oral Daily   [START ON 10/21/2022] docusate sodium  100 mg Oral Daily   enoxaparin (LOVENOX) injection  40 mg Subcutaneous Q24H   insulin aspart  0-15 Units Subcutaneous TID WC   insulin aspart  0-5 Units Subcutaneous QHS   insulin glargine-yfgn  30 Units Subcutaneous QHS   magnesium oxide  400 mg Oral BID   [START ON 10/21/2022] pantoprazole  40 mg Oral Daily   [START ON 10/21/2022] zinc sulfate  220 mg Oral Daily   Continuous Infusions:  ampicillin-sulbactam (UNASYN) IV     PRN Meds:.acetaminophen, alum & mag hydroxide-simeth, bisacodyl, diphenhydrAMINE, guaiFENesin-dextromethorphan, oxyCODONE, oxyCODONE, polyethylene glycol, prochlorperazine **OR** prochlorperazine **OR** prochlorperazine, senna-docusate, sodium phosphate, traZODone    Objective: Weight change:  No intake or output data in the 24 hours ending 10/20/22 1808  Blood pressure (!) 157/89, pulse (!) 101, temperature 98 F (36.7 C), temperature source Oral, resp. rate 16, height 5\' 9"  (1.753 m), weight 102 kg, SpO2 98 %. Temp:  [98 F (36.7 C)-98.4 F (36.9 C)] 98 F (36.7 C) (05/15 1719) Pulse Rate:  [101-107] 101 (05/15 1719) Resp:  [16-19] 16 (05/15 1719) BP: (135-157)/(80-90) 157/89 (05/15 1719) SpO2:  [90 %-98 %] 98 % (05/15 1641) Weight:  [102 kg] 102 kg (05/15 1719)  Physical Exam: Physical Exam Constitutional:      Appearance: He is well-developed.  HENT:     Head: Normocephalic and atraumatic.  Eyes:     Conjunctiva/sclera: Conjunctivae normal.  Cardiovascular:     Rate and Rhythm: Normal  rate and regular rhythm.  Pulmonary:     Effort: Pulmonary effort is normal. No respiratory distress.     Breath sounds: No wheezing.  Abdominal:     General: There is no distension.     Palpations: Abdomen is soft.  Musculoskeletal:        General: Normal range of motion.     Cervical back: Normal range of motion and neck supple.  Skin:    General: Skin is warm and dry.     Findings: No erythema or rash.  Neurological:     General: No focal deficit present.     Mental Status: He is alert and oriented to person, place, and time.  Psychiatric:        Mood and Affect: Mood normal.        Behavior: Behavior normal.        Thought Content: Thought content normal.        Judgment: Judgment normal.      CBC:    BMET Recent Labs    10/19/22 0327 10/20/22 0634  NA 136 134*  K 4.1 4.4  CL 100 97*  CO2 30 30  GLUCOSE 157* 194*  BUN <5* 5*  CREATININE 0.67 0.78  CALCIUM 8.0* 8.0*      Liver Panel  No results for input(s): "PROT", "ALBUMIN", "AST", "ALT", "ALKPHOS", "BILITOT", "BILIDIR", "IBILI" in the last 72 hours.      Sedimentation Rate No results for  input(s): "ESRSEDRATE" in the last 72 hours. C-Reactive Protein Recent Labs    10/18/22 0731  CRP 4.1*     Micro Results: Recent Results (from the past 720 hour(s))  Culture, blood (Routine x 2)     Status: Abnormal   Collection Time: 10/08/22  5:15 PM   Specimen: BLOOD  Result Value Ref Range Status   Specimen Description BLOOD RIGHT FOOT  Final   Special Requests   Final    BOTTLES DRAWN AEROBIC AND ANAEROBIC Blood Culture results may not be optimal due to an inadequate volume of blood received in culture bottles   Culture  Setup Time   Final    GRAM POSITIVE COCCI IN BOTH AEROBIC AND ANAEROBIC BOTTLES CRITICAL VALUE NOTED.  VALUE IS CONSISTENT WITH PREVIOUSLY REPORTED AND CALLED VALUE.    Culture (A)  Final    STREPTOCOCCUS CONSTELLATUS CRITICAL RESULT CALLED TO, READ BACK BY AND VERIFIED WITH:  PHARMD AUSTIN P. Performed at [facility], [address], [place], [place] [address]    Report Status 10/13/2022 FINAL  Final   Organism ID, Bacteria STREPTOCOCCUS CONSTELLATUS  Final      Susceptibility   Streptococcus constellatus - MIC*    PENICILLIN <=0.06 SENSITIVE Sensitive     CEFTRIAXONE <=0.12 SENSITIVE Sensitive     ERYTHROMYCIN <=0.12 SENSITIVE Sensitive     LEVOFLOXACIN <=0.25 SENSITIVE Sensitive     VANCOMYCIN 0.25 SENSITIVE Sensitive     * STREPTOCOCCUS CONSTELLATUS  Culture, blood (Routine x 2)     Status: Abnormal   Collection Time: 10/08/22  5:19 PM   Specimen: BLOOD  Result Value Ref Range Status   Specimen Description BLOOD RIGHT ANTECUBITAL  Final   Special Requests   Final    BOTTLES DRAWN AEROBIC AND ANAEROBIC Blood Culture adequate volume   Culture  Setup Time   Final    IN BOTH AEROBIC AND ANAEROBIC BOTTLES GRAM POSITIVE COCCI Organism ID to follow CRITICAL RESULT CALLED TO, READ BACK BY AND VERIFIED WITH:  C/ PHARMD H. VON DOHLEN 10/09/22 1608 A. LAFRANCE Performed at [facility], [address], [place], [place] [address]    Culture STREPTOCOCCUS MITIS/ORALIS (A)  Final   Report Status 10/11/2022 FINAL  Final   Organism ID, Bacteria STREPTOCOCCUS MITIS/ORALIS  Final      Susceptibility   Streptococcus mitis/oralis - MIC*    TETRACYCLINE >=16 RESISTANT Resistant     VANCOMYCIN 0.5 SENSITIVE Sensitive     CLINDAMYCIN 0.5 INTERMEDIATE Intermediate     PENICILLIN Value in next row Sensitive      SENSITIVEMIC <=/ 0.06    * STREPTOCOCCUS MITIS/ORALIS  Blood Culture ID Panel (Reflexed)     Status: Abnormal   Collection Time: 10/08/22  5:19 PM  Result Value Ref Range Status   Enterococcus faecalis NOT DETECTED NOT DETECTED Final   Enterococcus Faecium NOT DETECTED NOT DETECTED Final   Listeria monocytogenes NOT DETECTED NOT DETECTED Final   Staphylococcus species NOT DETECTED NOT DETECTED Final   Staphylococcus aureus (BCID) NOT DETECTED  NOT DETECTED Final   Staphylococcus epidermidis NOT DETECTED NOT DETECTED Final   Staphylococcus lugdunensis NOT DETECTED NOT DETECTED Final   Streptococcus species DETECTED (A) NOT DETECTED Final    Comment: Not Enterococcus species, Streptococcus agalactiae, Streptococcus pyogenes, or Streptococcus pneumoniae. CRITICAL RESULT CALLED TO, READ BACK BY AND VERIFIED WITH:  C/ PHARMD H. VON DOHLEN 10/09/22 1608 A. LAFRANCE    Streptococcus agalactiae NOT DETECTED NOT DETECTED Final  Streptococcus pneumoniae NOT DETECTED NOT DETECTED Final   Streptococcus pyogenes NOT DETECTED NOT DETECTED Final   A.calcoaceticus-baumannii NOT DETECTED NOT DETECTED Final   Bacteroides fragilis NOT DETECTED NOT DETECTED Final   Enterobacterales NOT DETECTED NOT DETECTED Final   Enterobacter cloacae complex NOT DETECTED NOT DETECTED Final   Escherichia coli NOT DETECTED NOT DETECTED Final   Klebsiella aerogenes NOT DETECTED NOT DETECTED Final   Klebsiella oxytoca NOT DETECTED NOT DETECTED Final   Klebsiella pneumoniae NOT DETECTED NOT DETECTED Final   Proteus species NOT DETECTED NOT DETECTED Final   Salmonella species NOT DETECTED NOT DETECTED Final   Serratia marcescens NOT DETECTED NOT DETECTED Final   Haemophilus influenzae NOT DETECTED NOT DETECTED Final   Neisseria meningitidis NOT DETECTED NOT DETECTED Final   Pseudomonas aeruginosa NOT DETECTED NOT DETECTED Final   Stenotrophomonas maltophilia NOT DETECTED NOT DETECTED Final   Candida albicans NOT DETECTED NOT DETECTED Final   Candida auris NOT DETECTED NOT DETECTED Final   Candida glabrata NOT DETECTED NOT DETECTED Final   Candida krusei NOT DETECTED NOT DETECTED Final   Candida parapsilosis NOT DETECTED NOT DETECTED Final   Candida tropicalis NOT DETECTED NOT DETECTED Final   Cryptococcus neoformans/gattii NOT DETECTED NOT DETECTED Final    Comment: Performed at [facility], [address], [place], [place] [address]  Surgical pcr screen      Status: Abnormal   Collection Time: 10/10/22 10:57 AM   Specimen: Nasal Mucosa; Nasal Swab  Result Value Ref Range Status   MRSA, PCR NEGATIVE NEGATIVE Final   Staphylococcus aureus POSITIVE (A) NEGATIVE Final    Comment: RESULT CALLED TO, READ BACK BY AND VERIFIED WITH: 10/10/22 AT 1228 TO RN Hendricks Limes, ADC (NOTE) The Xpert SA Assay (FDA approved for NASAL specimens in patients 76 years of age and older), is one component of a comprehensive surveillance program. It is not intended to diagnose infection nor to guide or monitor treatment. Performed at [facility], [address] [place], [place] [address]   Aerobic/Anaerobic Culture w Gram Stain (surgical/deep wound)     Status: None   Collection Time: 10/10/22 11:33 AM   Specimen: Leg, Right; Tissue  Result Value Ref Range Status   Specimen Description TISSUE ABSCESS  Final   Special Requests RIGHT CALF, FLAGYL  Final   Gram Stain ABUNDANT WBC SEEN ABUNDANT GRAM POSITIVE COCCI   Final   Culture   Final    RARE STREPTOCOCCUS MITIS/ORALIS NO ANAEROBES ISOLATED Performed at [facility], [address], [place], [place] [address]    Report Status 10/17/2022 FINAL  Final   Organism ID, Bacteria STREPTOCOCCUS MITIS/ORALIS  Final      Susceptibility   Streptococcus mitis/oralis - MIC*    TETRACYCLINE >=16 RESISTANT Resistant     VANCOMYCIN 0.5 SENSITIVE Sensitive     CLINDAMYCIN <=0.25 SENSITIVE Sensitive     PENICILLIN Value in next row Sensitive      SENSITIVEMIC =0.06S    * RARE STREPTOCOCCUS MITIS/ORALIS  Culture, blood (Routine X 2) w Reflex to ID Panel     Status: None   Collection Time: 10/11/22 10:31 AM   Specimen: BLOOD RIGHT HAND  Result Value Ref Range Status   Specimen Description BLOOD RIGHT HAND  Final   Special Requests   Final    BOTTLES DRAWN AEROBIC AND ANAEROBIC Blood Culture adequate volume   Culture   Final    NO GROWTH 5 DAYS Performed at [facility], [name]  [place], [place] [address]    Report Status 10/16/2022 FINAL  Final  Culture, blood (Routine X 2) w Reflex to ID Panel     Status: None   Collection Time: 10/11/22 10:41 AM   Specimen: BLOOD RIGHT HAND  Result Value Ref Range Status   Specimen Description BLOOD RIGHT HAND  Final   Special Requests   Final    BOTTLES DRAWN AEROBIC ONLY Blood Culture results may not be optimal due to an inadequate volume of blood received in culture bottles   Culture   Final    NO GROWTH 5 DAYS Performed at [facility], [address] [place], [place] [address]    Report Status 10/16/2022 FINAL  Final  Aerobic/Anaerobic Culture w Gram Stain (surgical/deep wound)     Status: None   Collection Time: 10/13/22 11:05 AM   Specimen: Leg, Right; Tissue  Result Value Ref Range Status   Specimen Description TISSUE  Final   Special Requests NONE  Final   Gram Stain   Final    RARE WBC PRESENT, PREDOMINANTLY MONONUCLEAR NO ORGANISMS SEEN    Culture   Final    No growth aerobically or anaerobically. Performed at [facility], [address], [place], [place] [address]    Report Status 10/18/2022 FINAL  Final    Studies/Results: No results found.    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 He is grew operative site has grown Streptococcus mitis oralis   The patient explained to me yesterday his reasons for refusing antibiotics today  #1 He had read that he had kidney disease on "mychart" and he  also noticed "pain in my kidneys" and that his urine smell had changed and that the antibiotics were causing formation of crystals in urine that could be hurting his kidney fxn. I pointed out that he actually has NORMAL RENAL fxn based on his serum creatinine .[name] But that when he was admitted with sepsis he DID have ARF.   I assured him that the antibiotics he is currently on are quite safe and very unlikely to ever hurt his kidneys.  I also explained my concerns for residual infection that will re-emerge without proper "mop up' antibiotics and involve bones of his leg and risk him also becoming septic again.   He also did not want the PICC line "which goes all the way to my heart" as this was not what he expected when I mentioned putting in a durable IV and it reminds him of his father who had one and as mentioned died.  He was  agreeable to oral antibiotics when he goes home though I would like him on IV antibiotics  OR ORAL while he is here. He thought about this over next 24 hours and still does not want IV or oral antibiotics while he is an inpatient.  I also offered single dose of ORITAVANCIN which could provider him coverage for 7-10 more days but he wants  to think about this further  #1 Streptococcal bacteremia due to gangrene and necrotizing fascitis:  The patient wishes to be off of antibiotics at present   He told me he would tell  his RN IF he decides he would want Oritavancin  I have personally spent 52 minutes involved in face-to-face and non-face-to-face activities for this patient on the day of the visit. Professional time spent includes the following activities: Preparing to see the patient (review of tests), Obtaining and/or reviewing separately obtained history (admission/discharge record), Performing a medically appropriate examination and/or evaluation , Ordering medications/tests/procedures, referring and communicating with other health care professionals, Documenting clinical  information in the EMR, Independently interpreting results (not separately reported), Communicating results to the patient/family/caregiver, Counseling and educating the patient/family/caregiver and Care coordination (not separately reported).   I will sign off for now.  Please call with further questions.    LOS: 0 days   Acey Lav 10/20/2022, 6:08 PM

## 2022-10-20 NOTE — H&P (Signed)
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 He presented to the ED after encouraged by daughter and was found to be febrile with T-102.6,  gangrenous changes, foul odor and purulent drainage from wound as well as reports of chills x one week. BS and lactate elevated, sodium low at 123, acute renal failure with Hollister-2.05 and WBC- 27.7.  He was started on IV antibiotics with fluid boluses and required encouragement to utilize insulin for BS control. Dr. Lajoyce Corners recommended R-BKA.  Dr. August Saucer provided 2 nd opinion at behest of patient and concurred with decision.  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 He was found to have strep oralis/mitis bacteremia from foot infection and TEE done was negative for endocarditis.   He was taken back to OR for repeat I and D on 05/08 and 05/10 with downward trend in WBC.  Dr. Daiva Eves following for input and antibiotics narrowed to Unasyn. Dr. Lajoyce Corners recommended long term antibiotics due to persistently elevated WBC  as well as poorly controlled BS as is refusing insulin as well as intermittently refusing antibiotics.   Has been refusing protein supplements, all insulin, vitamins, Mag supplement as well as IV antibiotics for past 24  hours. Multiple providers have been advising compliance and educating patient during his stay.  He did agree to IV antibiotics during hospitalization with transition to oral per prior discussion with Dr. Daiva Eves but has refused antibiotic for past 24+ hours.  Patient reports he thinks the antibiotics will damage his kidneys.  Patient says any oral medications worsen his gastroparesis.   Today Dr. Zenaida Niece dam discussed risks and benefits of antibiotics and that this is unlikely to cause kidney damage.  He also suggested Oritavancin which would provide 7 to 10 days of coverage.  Patient continues to decline antibiotics.  He reports his last bowel movement was this morning.  PT/OT has been working with patient who continues to be limited by weakness with difficulty off loading LLE and R-BKA. CIR recommended due to functional decline.    Review of Systems  Constitutional:  Negative for chills and fever.  HENT:  Negative for hearing loss.   Eyes:  Negative for double vision.  Respiratory:  Negative for shortness of breath.   Cardiovascular:  Negative for chest pain.  Gastrointestinal:  Positive for abdominal pain, constipation and heartburn.  Musculoskeletal:  Positive for myalgias.  Neurological:  Positive for weakness. Negative for headaches.  Psychiatric/Behavioral:  The patient does not have insomnia.     Past Medical History:  Diagnosis Date   Diabetes mellitus     Past Surgical History:  Procedure Laterality Date   AMPUTATION Right 10/10/2022   Procedure: AMPUTATION BELOW KNEE;  Surgeon: [name], MD;  Location: [facility] OR;  Service: Orthopedics;  Laterality: Right;   STUMP REVISION Right 10/13/2022   Procedure: REVISION RIGHT BELOW KNEE AMPUTATION;  Surgeon: [name], MD;  Location: [facility] OR;  Service: Orthopedics;  Laterality: Right;   STUMP REVISION Right 10/15/2022   Procedure: REVISION RIGHT BELOW KNEE AMPUTATION;  Surgeon: [name], MD;  Location: [facility]  OR;  Service: Orthopedics;  Laterality: Right;   TEE WITHOUT CARDIOVERSION N/A 10/14/2022   Procedure: TRANSESOPHAGEAL ECHOCARDIOGRAM;  Surgeon: Quintella Reichert, MD;  Location: [facility] INVASIVE CV LAB;  Service: Cardiovascular;  Laterality: N/A;    History reviewed. No pertinent family history.   Social History:  reports that he has never smoked. He does not have any smokeless tobacco history on file. He reports  current alcohol use. He reports that he does not use drugs.   Allergies: No Known Allergies   Medications Prior to Admission  Medication Sig Dispense Refill   acetaminophen (TYLENOL) 325 MG tablet Take 1-2 tablets (325-650 mg total) by mouth every 6 (six) hours as needed for mild pain (pain score 1-3 or temp > 100.5).     alum & mag hydroxide-simeth (MAALOX/MYLANTA) 200-200-20 MG/5ML suspension Take 15-30 mLs by mouth every 2 (two) hours as needed for indigestion. 355 mL 0   Ampicillin-Sulbactam 3 g in sodium chloride 0.9 % 100 mL Inject 3 g into the vein every 6 (six) hours for 23 days.     [START ON 10/21/2022] ascorbic acid (VITAMIN C) 1000 MG tablet Take 1 tablet (1,000 mg total) by mouth daily.     bisacodyl (DULCOLAX) 5 MG EC tablet Take 1 tablet (5 mg total) by mouth daily as needed for moderate constipation. 30 tablet 0   [START ON 10/21/2022] docusate sodium (COLACE) 100 MG capsule Take 1 capsule (100 mg total) by mouth daily. 10 capsule 0   guaiFENesin-dextromethorphan (ROBITUSSIN DM) 100-10 MG/5ML syrup Take 15 mLs by mouth every 4 (four) hours as needed for cough. 118 mL 0   heparin 5000 UNIT/ML injection Inject 1 mL (5,000 Units total) into the skin every 8 (eight) hours. 1 mL    HYDROmorphone (DILAUDID) 1 MG/ML injection Inject 0.5-1 mLs (0.5-1 mg total) into the vein every 4 (four) hours as needed for severe pain (pain score 7-10, uncontrolled pain with PO analgesic). 1 mL 0   insulin aspart (NOVOLOG) 100 UNIT/ML injection Inject 0-5 Units into the skin at bedtime. 10 mL 11   insulin aspart (NOVOLOG) 100 UNIT/ML injection Inject 0-15 Units into the skin 3 (three) times daily with meals. 10 mL 11   insulin glargine-yfgn (SEMGLEE) 100 UNIT/ML injection Inject 0.3 mLs (30 Units total) into the skin at bedtime. 10 mL 11   magnesium oxide (MAG-OX) 400 (240 Mg) MG tablet Take 1 tablet (400 mg total) by mouth 2 (two) times daily.     Morphine Sulfate (MORPHINE, PF,) 2 MG/ML injection  Inject 1 mL (2 mg total) into the vein every 4 (four) hours as needed. 1 mL 0   ondansetron (ZOFRAN) 4 MG/2ML SOLN injection Inject 2 mLs (4 mg total) into the vein every 6 (six) hours as needed for nausea or vomiting. 2 mL 0   oxyCODONE (OXY IR/ROXICODONE) 5 MG immediate release tablet Take 1-2 tablets (5-10 mg total) by mouth every 4 (four) hours as needed for moderate pain (pain score 4-6). 30 tablet 0   oxyCODONE 10 MG TABS Take 1-1.5 tablets (10-15 mg total) by mouth every 4 (four) hours as needed for severe pain (pain score 7-10). 30 tablet 0   [START ON 10/21/2022] pantoprazole (PROTONIX) 40 MG tablet Take 1 tablet (40 mg total) by mouth daily.     polyethylene glycol (MIRALAX / GLYCOLAX) 17 g packet Take 17 g by mouth daily as needed for mild constipation. 14 each 0   potassium chloride SA (KLOR-CON M) 20 MEQ tablet  Take 1-2 tablets (20-40 mEq total) by mouth daily as needed (low potassium level).     senna-docusate (SENOKOT-S) 8.6-50 MG tablet Take 1 tablet by mouth at bedtime as needed for mild constipation.     [START ON 10/21/2022] zinc sulfate 220 (50 Zn) MG capsule Take 1 capsule (220 mg total) by mouth daily.        Home: Home Living Family/patient expects to be discharged to:: Private residence Living Arrangements: Children Available Help at Discharge: Family, Available PRN/intermittently, Friend(s) Type of Home: Apartment Home Access: Level entry Home Layout: One level Bathroom Shower/Tub: Engineer, manufacturing systems: Standard Home Equipment: None Additional Comments: daughter works and attends school but is in and out throughout the day. 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 Independent with mobility, was playing  basketball ADLs Comments: independent   Functional Status:  Mobility: Bed Mobility Overal bed mobility: Modified Independent Bed Mobility: Rolling Rolling: Modified independent (Device/Increase time) Supine to sit: Supervision, HOB elevated General bed mobility comments: Mod I to roll for exercises Transfers Overall transfer level: Needs assistance Equipment used: Rolling walker (2 wheels) Transfers: Sit to/from Stand Sit to Stand: Min guard Bed to/from chair/wheelchair/BSC transfer type:: Step pivot Stand pivot transfers: Min guard Step pivot transfers: Min guard General transfer comment: increased effort, use of RW and min guard for safety/balance Ambulation/Gait Ambulation/Gait assistance: Min assist Gait Distance (Feet): 5 Feet (fwd and bkwd, 2x) Assistive device: Rolling walker (2 wheels) Gait Pattern/deviations:  (hop to) General Gait Details: pt first hopped on L foot bed to chair. Then worked on taking full wt through upper body and gliding fwd and bkwd for less impact on LLE. L shoe donned before gait Gait velocity: decreased Gait velocity interpretation: <1.31 ft/sec, indicative of household ambulator Pre-gait activities: lfting L foot up and down in place with RW   ADL: ADL Overall ADL's : Needs assistance/impaired Eating/Feeding: Independent, Sitting Grooming: Set up, Sitting Grooming Details (indicate cue type and reason):  (chair at sink) Upper Body Bathing: Set up, Sitting Upper Body Bathing Details (indicate cue type and reason): EOB Lower Body Bathing: Maximal assistance, Sit to/from stand Upper Body Dressing : Set up, Sitting Upper Body Dressing Details (indicate cue type and reason): EOB Lower Body Dressing: Maximal assistance, Sit to/from stand Toilet Transfer: Stand-pivot, Rolling walker (2 wheels), Min guard Toilet Transfer Details (indicate cue type and reason): min guard to toilet from EOB Toileting- Clothing Manipulation and Hygiene: Moderate  assistance, Sitting/lateral lean Functional mobility during ADLs: Min guard, Rolling walker (2 wheels) General ADL Comments: Pt able to perform mobility min guard, tires easily, needs break. Pt able to lean on sink and perform some ADLs standing, but requires use of chair due to decreased endurance and overall balance without BUE support.   Cognition: Cognition Overall Cognitive Status: Within Functional Limits for tasks assessed Orientation Level: Oriented X4 Cognition Arousal/Alertness: Awake/alert Behavior During Therapy: WFL for tasks assessed/performed Overall Cognitive Status: Within Functional Limits for tasks assessed General Comments: Good carry over of most information learned in PT session this morning   Physical Exam: Blood pressure (!) 157/89, pulse (!) 101, temperature 98 F (36.7 C), temperature source Oral, resp. rate 16, height 5\' 9"  (1.753 m), weight 102 kg, SpO2 98 %. Physical Exam  General: Alert and  oriented x 3, No apparent distress HEENT: Head is normocephalic, atraumatic, PERRLA, EOMI, sclera anicteric, oral mucosa pink and moist Neck: Supple without JVD or lymphadenopathy Heart: Reg rate and rhythm. No murmurs rubs or gallops Chest: CTA bilaterally without wheezes, rales, or rhonchi; no distress Abdomen: Soft, non-tender, non-distended, bowel sounds positive-hypoactive. Extremities: No clubbing, cyanosis, or edema. R BKA with dry dressing in place R wrist with gauze dressing wrapped around this arm. Areas of  skin abrasions- reports he popped blisters in this area previously  Psych: Pt's affect is appropriate. Pt is cooperative Skin: Clean and intact without signs of breakdown Neuro:  Alert and oriented, follows commands, CN 2-12 grossly intact, no speech or language deficits noted  Strength 5/5 in b/l UE and LLE Strength 5/5 hip flexion RLE, able to extend R knee to gravity Sensation intact to LT in b/l UE and LLE , intact to LT L residual limb No ataxia  or dysmetria noted Musculoskeletal: No joint swelling or tenderness noted    Results for orders placed or performed during the hospital encounter of 10/20/22 (from the past 48 hour(s))  Glucose, capillary     Status: Abnormal   Collection Time: 10/20/22  4:43 PM  Result Value Ref Range   Glucose-Capillary 127 (H) 70 - 99 mg/dL    Comment: Glucose reference range applies only to samples taken after fasting for at least 8 hours.   No results found.    Blood pressure (!) 157/89, pulse (!) 101, temperature 98 F (36.7 C), temperature source Oral, resp. rate 16, height 5\' 9"  (1.753 m), weight 102 kg, SpO2 98 %.  Medical Problem List and Plan: 1. 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  Antithrombotics: -DVT/anticoagulation:  Pharmaceutical: Lovenox  -antiplatelet therapy: N/A 3. Pain Management: Tylenol prn.  4. Mood/Behavior/Sleep: LCSW to follow for evaluation and support.   -antipsychotic agents: N/A 5. Neuropsych/cognition: This patient is capable of making decisions on his own behalf. 6. Skin/Wound Care: Routine pressure relief measures.  7. Fluids/Electrolytes/Nutrition: Monitor I/O. Refusing all meds as it makes him sick  --briefly dicussed need for meds but he declines due to severe reflux/nausea  --will d/c vitamins and colace to simplify regimen 8. Strep oralis/mitis Bacteremia: Has been refusing IV antibiotics and any meds since yesterday pm.  He 9. Severe GERD: Has refusing PPI--but after discussion was willing to try IV for relief.  10. T2DM: Hgb A1C- 10.4 , previously had blood sugars over 500 although now appear improved, he continues to refuse insulin.   11. Hyponatremia: 10/20/2022 NA 134 recheck in am 12. Anemia of chronic illness?:  10/20/2022 his Hgb was 8.4  recheck in am.  13.  Diabetic gastroparesis.  Refused Reglan 14.  Hypomagnesemia.  Recheck tomorrow  I have personally performed a face to face diagnostic evaluation of this patient and formulated the key components of the plan.  Additionally, I have personally reviewed laboratory data, imaging studies, as well as relevant notes and concur with the physician assistant's documentation above.  The patient's status has not changed from the original H&P.  Any changes in documentation from the acute care chart have been noted above.  Fanny Dance, MD, [place] [facility]   Jacquelynn Cree, PA-C 10/20/2022

## 2022-10-20 NOTE — Progress Notes (Signed)
Inpatient Rehabilitation Admission Medication Review by a Pharmacist   A complete drug regimen review was completed for this patient to identify any potential clinically significant medication issues.   High Risk Drug Classes Is patient taking? Indication by Medication  Antipsychotic Yes Compazine - N/V  Anticoagulant Yes Lovenox - DVT px  Antibiotic Yes Unasyn until 6/7 - bacteremia  Opioid Yes Oxycodone - pain  Antiplatelet No    Hypoglycemics/insulin Yes SSI/semglee - DM  Vasoactive Medication No    Chemotherapy No    Other Yes Vitamin C/zinc - wounds Protonix - GERD Mag-ox - supplementation Trazodone - sleep        Type of Medication Issue Identified Description of Issue Recommendation(s)  Drug Interaction(s) (clinically significant)        Duplicate Therapy        Allergy        No Medication Administration End Date        Incorrect Dose        Additional Drug Therapy Needed        Significant med changes from prior encounter (inform family/care partners about these prior to discharge).      Other   Dilaudid/morphine Currently on oxycodone      Clinically significant medication issues were identified that warrant physician communication and completion of prescribed/recommended actions by midnight of the next day:  No   Name of provider notified for urgent issues identified:    Provider Method of Notification:        Pharmacist comments:    Time spent performing this drug regimen review (minutes):  20  Loralee Pacas, PharmD, BCPS Please see amion for complete clinical pharmacist phone list 10/20/2022 4:40 PM

## 2022-10-20 NOTE — Discharge Summary (Signed)
Physician Discharge Summary  [name] VHQ:[id number] DOB: 1975/10/04 DOA: 10/08/2022  PCP: System, Provider Not In  Admit date: 10/08/2022 Discharge date: 10/20/2022  Admitted From: Home  Discharge disposition: CIR  Recommendations for Outpatient Follow-Up:   Follow up with infectious disease and orthopedics as outpatient. Patient has been refusing insulin and antibiotic despite multiple conversations, risk versus benefit profile.   Discharge Diagnosis:   Principal Problem:   Diabetic foot ulcer (HCC) Active Problems:   Diabetes (HCC)   Severe sepsis (HCC)   Hyponatremia   Lactic acidosis   AKI (acute kidney injury) (HCC)   Gastroparesis   Elevated troponin   H/O: CVA (cerebrovascular accident)   Gangrene of right foot (HCC)   Cellulitis of right lower extremity   Necrotizing fasciitis of lower leg (HCC)   Discharge Condition: Improved.  Diet recommendation:Carbohydrate-modified.    Wound care: Surgical site care.  Code status: Full.   History of Present Illness:   Patient is a 47 year old male with past medical history of with past medical history of untreated diabetes mellitus type 2, gastroparesis, CVA, MRSA cellulitis 2021 presented to hospital with progressive swelling and blackening of the right big toe.  He was initially seen at the urgent care center and prescribed a course of doxycycline but could not tolerate it due to nausea and vomiting and then presented to the ED with fever chills.  Initially patient had temperature of 102.6 F with tachycardia of 126.[name]  Patient had edematous right great toe with weeping, foul order and eschar.  Initial labs showed  WBC count 27.7, lactic acid 3.3>7.4>2.5. CT right foot with contrast showed extensive subcutaneous and intramuscular gas within the intrinsic musculature of the right foot as well as the terminal visualized right lower extremity musculature, findings suggestive of necrotizing fasciitis, acute osteomyelitis of  first metatarsal head.  Blood culture obtained on admission grew Streptococcus.  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  TEE negative for vegetation.  Patient has been intermittently resistant to insulin regimen and IV antibiotic.     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 Lajoyce Corners; revision 5/8 & 5/10. Streptococcal bacteremia Patient presented with a right foot wound with gas gangrene and necrotizing fasciitis.  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  Blood culture with Streptococcus. TTE/TEE negative for vegetation.    ID on board and recommend IV Unasyn, plan to continue until 11/12/2022.  Patient has been refusing antibiotic despite several conversations about the risk versus benefits from different providers..  Orthopedics following the patient.  At this time patient does not wish to continue with PICC line and IV antibiotics.  CRP mildly elevated.  PT OT has seen the patient at this time and recommended CIR. would encourage conversations about antibiotics after discharge.     AKI/Lactic acidosis Due to hypoperfusion.  Resolved at this time.  Latest creatinine of 0.7.   Hypomagnesemia will replenish orally.  Latest magnesium of 1.6.   Acute hyponatremia Improved at this time.  Latest sodium level of 134.  Monitor with BMP.   Elevated troponin Demand ischemia likely secondary to sepsis and infection.  No cardiac symptoms    Type 2 diabetes mellitus uncontrolled with hyperglycemia Latest hemoglobin A1c 10.4 on 10/08/2022.  Presented with a blood sugar level elevated over 500  recently at home.  Has been prescribed insulin regimen but refusing it.  Diabetic gastroparesis Not on medications at home.  Refused Reglan.   H/o CVA Not on any meds     Disposition.  At this time, patient is stable for disposition to CIR.  Medical Consultants:   Orthopedics Infectious disease  Procedures:    S/p right BKA -5/5 Dr. Lajoyce Corners; revision 5/8 & 5/10. TEE  Subjective:   Today, patient was seen and examined with denies interval complaints.  Awaiting for CIR.  Discharge Exam:   Vitals:   10/19/22 1914 10/20/22 0734  BP: 135/80 (!) 152/90  Pulse: (!) 104 (!) 107  Resp: 19 16  Temp: 98.4 F (36.9 C) 98.4 F (36.9 C)  SpO2: 95% 90%   Vitals:   10/19/22 0746 10/19/22 1431 10/19/22 1914 10/20/22 0734  BP: (!) 159/101 106/68 135/80 (!) 152/90  Pulse: (!) 103 (!) 102 (!) 104 (!) 107  Resp:   19 16  Temp:  98.3 F (36.8 C) 98.4 F (36.9 C) 98.4 F (36.9 C)  TempSrc: Oral Oral  Oral  SpO2: 95% 90% 95% 90%  Weight:      Height:       General: Alert awake, not in obvious distress, obese, HENT: pupils equally reacting to light,  No scleral pallor or icterus noted. Oral mucosa is moist.  Chest:  Clear breath sounds.  Diminished breath sounds bilaterally. No crackles or wheezes.  CVS: S1 &S2 heard. No murmur.  Regular rate and rhythm. Abdomen: Soft, nontender, nondistended.  Bowel sounds are heard.   Extremities: Status post right BKA with dressing. Psych: Alert, awake and oriented, normal mood CNS:  No cranial nerve deficits.   Skin: Warm and dry.    The results of significant diagnostics from this hospitalization (including imaging, microbiology, ancillary and laboratory) are listed below for reference.     Diagnostic Studies:   VAS [place] ABI WITH/WO TBI  Result Date: 10/09/2022  LOWER EXTREMITY DOPPLER STUDY Patient Name:  Carlos [name]  Date of Exam:   10/08/2022 Medical Rec #: [id number]   Accession #:    [id number] Date of Birth: [birth year]  Patient Gender: M Patient Age:   33 years Exam Location:  [facility] Procedure:      VAS [place] ABI WITH/WO TBI Referring Phys: JOSHUA GEIPLE  --------------------------------------------------------------------------------  Indications: Gangrene. High Risk Factors: Diabetes.  Limitations: Today's exam was limited due to patient movement. Comparison Study: No previous exams Performing Technologist: Hill, Jody RVT, RDMS  Examination Guidelines: A complete evaluation includes at minimum, Doppler waveform signals and systolic blood pressure reading at the level of bilateral brachial, anterior tibial, and posterior tibial arteries, when vessel segments are accessible. Bilateral testing is considered an integral part of a complete examination. Photoelectric Plethysmograph (PPG) waveforms and toe systolic pressure readings are included as required and additional duplex testing as needed. Limited examinations for reoccurring indications may be performed as noted.  ABI Findings: +--------+------------------+-----+----------------+----------------+ Right   Rt Pressure (mmHg)IndexWaveform        Comment          +--------+------------------+-----+----------------+----------------+ [id number]                    triphasic                        +--------+------------------+-----+----------------+----------------+ PTA  audibly biphasicnon compressible +--------+------------------+-----+----------------+----------------+ DP      123               0.93 audibly biphasic                 +--------+------------------+-----+----------------+----------------+ +---------+------------------+-----+--------+----------------+ Left     Lt Pressure (mmHg)IndexWaveformComment          +---------+------------------+-----+--------+----------------+ Brachial 123                                             +---------+------------------+-----+--------+----------------+ PTA                             biphasicnon compressible +---------+------------------+-----+--------+----------------+ DP       105               0.80  biphasic                 +---------+------------------+-----+--------+----------------+ Great Toe46                0.35 Abnormal                 +---------+------------------+-----+--------+----------------+ +-------+-----------+-----------+------------+------------+ ABI/TBIToday's ABIToday's TBIPrevious ABIPrevious TBI +-------+-----------+-----------+------------+------------+ Right  [place]                                             +-------+-----------+-----------+------------+------------+ Left   [place]         0.35                                +-------+-----------+-----------+------------+------------+ Arterial wall calcification precludes accurate ankle pressures and ABIs.  Summary: Right: Resting right ankle-brachial index indicates noncompressible right lower extremity arteries. Unable to obtain toe pressure due to gangrene/open wound. Left: Resting left ankle-brachial index indicates noncompressible left lower extremity arteries. The left toe-brachial index is abnormal. *See table(s) above for measurements and observations.  Electronically signed by Coral Else MD on 10/09/2022 at 3:03:33 PM.    Final    CT FOOT RIGHT W CONTRAST  Result Date: 10/09/2022 CLINICAL DATA:  Right foot infection EXAM: CT OF THE LOWER RIGHT EXTREMITY WITH CONTRAST TECHNIQUE: Multidetector CT imaging of the lower right extremity was performed according to the standard protocol following intravenous contrast administration. RADIATION DOSE REDUCTION: This exam was performed according to the departmental dose-optimization program which includes automated exposure control, adjustment of the mA and/or kV according to patient size and/or use of iterative reconstruction technique. CONTRAST:  75mL OMNIPAQUE IOHEXOL 350 MG/ML SOLN COMPARISON:  None Available. FINDINGS: Bones/Joint/Cartilage Normal alignment. No acute fracture or dislocation. No there is gas within the first metatarsal head, as well as the proximal  and distal phalanx of the great toe suspicious for changes of osteomyelitis. Joint spaces are preserved. Ligaments Suboptimally assessed by CT. Muscles and Tendons There is extensive subcutaneous and intramuscular gas within the intrinsic musculature of the right foot as well as the terminal visualized right lower extremity musculature. The findings are in keeping with aggressive infection such as necrotizing fasciitis. Gas extends cephalad to the superior margin of the examination. Soft tissues Extensive subcutaneous edema within the visualized right foot. As noted above, extensive subcutaneous gas throughout the right foot  visualized right ankle. Advanced vascular calcifications are noted. No discrete drainable fluid collection is seen within the subcutaneous soft tissues. IMPRESSION: 1. Extensive subcutaneous and intramuscular gas within the intrinsic musculature of the right foot as well as the terminal visualized right lower extremity musculature. The findings are in keeping with aggressive infection such as necrotizing fasciitis. The extent of disease extends beyond the cephalad margin of this examination. 2. Gas within the first metatarsal head, as well as the proximal and distal phalanx of the great toe suspicious for changes of osteomyelitis. 3. Extensive subcutaneous edema within the visualized right foot. No drainable fluid collection. 4. Advanced vascular calcifications. Electronically Signed   By: Helyn Numbers M.D.   On: 10/09/2022 02:29   DG Tibia/Fibula Right  Result Date: 10/08/2022 CLINICAL DATA:  Infection in right foot and right ankle EXAM: RIGHT TIBIA AND FIBULA - 2 VIEW COMPARISON:  None Available. FINDINGS: No fracture or dislocation is seen. Small linear foci of air in the soft tissues in right calf extending to the level of proximal shaft of tibia. There are more pockets of air posterior to the distal tibia. Arterial calcifications are seen in soft tissues. IMPRESSION: There are pockets  of air in the soft tissues in right calf extending to the level of proximal shaft of right tibia suggesting cephalad extension of anaerobic infection. Electronically Signed   By: Ernie Avena M.D.   On: 10/08/2022 19:45   DG Foot 2 Views Right  Result Date: 10/08/2022 CLINICAL DATA:  Possible sepsis, infected wound in the skin EXAM: RIGHT FOOT - 2 VIEW COMPARISON:  None Available. 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 No definite displaced fracture or dislocation is seen. Evaluation for lytic lesions is limited by the numerous pockets of air in the soft tissues. There is soft tissue swelling over the dorsum. Arterial calcifications are seen. 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 No displaced fracture or dislocation is seen. Arteriosclerosis. Imaging findings were relayed to patient's provider Dr. Jeraldine Loots by telephone call at 6:20 p.m. on 10/08/2022. Electronically Signed   By: Ernie Avena M.D.   On: 10/08/2022 18:23   DG Chest 2 View  Result Date: 10/08/2022 CLINICAL DATA:  Sepsis. EXAM: CHEST - 2 VIEW COMPARISON:  X-ray 08/17/2021 FINDINGS: Underinflation. Borderline cardiac silhouette. Bronchovascular crowding. No consolidation, pneumothorax or effusion. No edema. Films are under penetrated. IMPRESSION: Underinflated x-ray. Borderline size heart. Bronchovascular crowding. Follow-up x-ray with improved inflation can be performed when clinically appropriate Electronically Signed   By: Karen Kays M.D.   On: 10/08/2022 18:18     Labs:   Basic Metabolic Panel: Recent Labs  Lab 10/14/22 0153 10/16/22 0227 10/17/22 0256 10/19/22 0327 10/20/22 0634  NA 133* 131* 133* 136 134*  K 3.8 4.2 4.0 4.1 4.4  CL 102 98 99 100 97*  CO2 24 24 25 30 30   GLUCOSE 119* 257* 233* 157*  194*  BUN 7 8 5* <5* 5*  CREATININE 0.67 0.83 0.71 0.67 0.78  CALCIUM 7.3* 7.2* 7.6* 8.0* 8.0*  MG  --   --   --  1.5* 1.6*  PHOS  --   --  2.0*  --   --    GFR Estimated Creatinine Clearance: 130.3 mL/min (by C-G formula based on SCr of 0.78 mg/dL). Liver  Function Tests: Recent Labs  Lab 10/17/22 0256  ALBUMIN 1.6*   No results for input(s): "LIPASE", "AMYLASE" in the last 168 hours. No results for input(s): "AMMONIA" in the last 168 hours. Coagulation profile No results for input(s): "INR", "PROTIME" in the last 168 hours.  CBC: Recent Labs  Lab 10/14/22 0153 10/16/22 0227 10/19/22 0327 10/20/22 0634  WBC 14.0* 16.6* 7.2 7.0  NEUTROABS 10.0* 12.3*  --   --   HGB 9.0* 8.7* 8.3* 8.4*  HCT 27.9* 27.2* 26.8* 26.4*  MCV 92.1 92.2 92.7 93.3  PLT 383 431* 433* 375   Cardiac Enzymes: No results for input(s): "CKTOTAL", "CKMB", "CKMBINDEX", "TROPONINI" in the last 168 hours. BNP: Invalid input(s): "POCBNP" CBG: Recent Labs  Lab 10/19/22 0753 10/19/22 1136 10/19/22 1615 10/19/22 1914 10/20/22 0730  GLUCAP 129* 168* 143* 168* 180*   D-Dimer No results for input(s): "DDIMER" in the last 72 hours. Hgb A1c No results for input(s): "HGBA1C" in the last 72 hours. Lipid Profile No results for input(s): "CHOL", "HDL", "LDLCALC", "TRIG", "CHOLHDL", "LDLDIRECT" in the last 72 hours. Thyroid function studies No results for input(s): "TSH", "T4TOTAL", "T3FREE", "THYROIDAB" in the last 72 hours.  Invalid input(s): "FREET3" Anemia work up No results for input(s): "VITAMINB12", "FOLATE", "FERRITIN", "TIBC", "IRON", "RETICCTPCT" in the last 72 hours. Microbiology Recent Results (from the past 240 hour(s))  Surgical pcr screen     Status: Abnormal   Collection Time: 10/10/22 10:57 AM   Specimen: Nasal Mucosa; Nasal Swab  Result Value Ref Range Status   MRSA, PCR NEGATIVE NEGATIVE Final   Staphylococcus aureus POSITIVE (A) NEGATIVE Final    Comment: RESULT CALLED TO, READ  BACK BY AND VERIFIED WITH: 10/10/22 AT 1228 TO RN Hendricks Limes, ADC (NOTE) The Xpert SA Assay (FDA approved for NASAL specimens in patients 82 years of age and older), is one component of a comprehensive surveillance program. It is not intended to diagnose infection nor to guide or monitor treatment. Performed at [facility], [address], [place], [place] [address]   Aerobic/Anaerobic Culture w Gram Stain (surgical/deep wound)     Status: None   Collection Time: 10/10/22 11:33 AM   Specimen: Leg, Right; Tissue  Result Value Ref Range Status   Specimen Description TISSUE ABSCESS  Final   Special Requests RIGHT CALF, FLAGYL  Final   Gram Stain ABUNDANT WBC SEEN ABUNDANT GRAM POSITIVE COCCI   Final   Culture   Final    RARE STREPTOCOCCUS MITIS/ORALIS NO ANAEROBES ISOLATED Performed at [facility], [address] [place], [place] [address]    Report Status 10/17/2022 FINAL  Final   Organism ID, Bacteria STREPTOCOCCUS MITIS/ORALIS  Final      Susceptibility   Streptococcus mitis/oralis - MIC*    TETRACYCLINE >=16 RESISTANT Resistant     VANCOMYCIN 0.5 SENSITIVE Sensitive     CLINDAMYCIN <=0.25 SENSITIVE Sensitive     PENICILLIN Value in next row Sensitive      SENSITIVEMIC =0.06S    * RARE STREPTOCOCCUS MITIS/ORALIS  Culture, blood (Routine X 2) w Reflex to ID Panel     Status: None   Collection Time: 10/11/22 10:31 AM   Specimen: BLOOD RIGHT HAND  Result Value Ref Range Status   Specimen Description BLOOD RIGHT HAND  Final   Special Requests   Final    BOTTLES DRAWN AEROBIC AND ANAEROBIC Blood Culture adequate volume   Culture   Final    NO GROWTH 5 DAYS Performed at  [facility], [address] [place] [address] [place], [place] [address]    Report Status 10/16/2022 FINAL  Final  Culture, blood (Routine X 2) w Reflex to ID Panel     Status: None   Collection Time: 10/11/22 10:41 AM   Specimen: BLOOD RIGHT HAND  Result Value Ref Range Status   Specimen  Description BLOOD RIGHT HAND  Final   Special Requests   Final    BOTTLES DRAWN AEROBIC ONLY Blood Culture results may not be optimal due to an inadequate volume of blood received in culture bottles   Culture   Final    NO GROWTH 5 DAYS Performed at [facility], [address], [place], [place] [address]    Report Status 10/16/2022 FINAL  Final  Aerobic/Anaerobic Culture w Gram Stain (surgical/deep wound)     Status: None   Collection Time: 10/13/22 11:05 AM   Specimen: Leg, Right; Tissue  Result Value Ref Range Status   Specimen Description TISSUE  Final   Special Requests NONE  Final   Gram Stain   Final    RARE WBC PRESENT, PREDOMINANTLY MONONUCLEAR NO ORGANISMS SEEN    Culture   Final    No growth aerobically or anaerobically. Performed at [facility], [address], [place], [place] [address]    Report Status 10/18/2022 FINAL  Final     Discharge Instructions:   Discharge Instructions     Amb Referral to Nutrition and Diabetic Education   Complete by: As directed    Diet Carb Modified   Complete by: As directed    Discharge instructions   Complete by: As directed    Continue rehabilitation.  Would recommend continuation of insulin and antibiotics as prescribed.  Follow-up with infectious disease and orthopedics Dr Lajoyce Corners as outpatient.   Discharge wound care:   Complete by: As directed    Stump care   Increase activity slowly   Complete by: As directed       Allergies as of 10/20/2022   No Known Allergies      Medication List     TAKE these medications    acetaminophen 325 MG tablet Commonly known as: TYLENOL Take 1-2 tablets (325-650 mg total) by mouth every 6 (six) hours as needed for mild pain (pain score 1-3 or temp > 100.5).   alum & mag hydroxide-simeth 200-200-20 MG/5ML suspension Commonly known as: MAALOX/MYLANTA Take 15-30 mLs by mouth every 2 (two) hours as needed for indigestion.   Ampicillin-Sulbactam 3 g in sodium chloride  0.9 % 100 mL Inject 3 g into the vein every 6 (six) hours for 23 days.   ascorbic acid 1000 MG tablet Commonly known as: VITAMIN C Take 1 tablet (1,000 mg total) by mouth daily. Start taking on: Oct 21, 2022   bisacodyl 5 MG EC tablet Commonly known as: DULCOLAX Take 1 tablet (5 mg total) by mouth daily as needed for moderate constipation.   docusate sodium 100 MG capsule Commonly known as: COLACE Take 1 capsule (100 mg total) by mouth daily. Start taking on: Oct 21, 2022   guaiFENesin-dextromethorphan 100-10 MG/5ML syrup Commonly known as: ROBITUSSIN DM Take 15 mLs by mouth every 4 (four) hours as needed for cough.   heparin 5000 UNIT/ML injection Inject 1 mL (5,000 Units total) into the skin every 8 (eight) hours.   HYDROmorphone 1 MG/ML injection Commonly known as: DILAUDID Inject 0.5-1 mLs (0.5-1 mg total) into the vein every 4 (four) hours as needed  for severe pain (pain score 7-10, uncontrolled pain with PO analgesic).   insulin aspart 100 UNIT/ML injection Commonly known as: novoLOG Inject 0-5 Units into the skin at bedtime.   insulin aspart 100 UNIT/ML injection Commonly known as: novoLOG Inject 0-15 Units into the skin 3 (three) times daily with meals.   insulin glargine-yfgn 100 UNIT/ML injection Commonly known as: SEMGLEE Inject 0.3 mLs (30 Units total) into the skin at bedtime.   magnesium oxide 400 (240 Mg) MG tablet Commonly known as: MAG-OX Take 1 tablet (400 mg total) by mouth 2 (two) times daily.   morphine (PF) 2 MG/ML injection Inject 1 mL (2 mg total) into the vein every 4 (four) hours as needed.   ondansetron 4 MG/2ML Soln injection Commonly known as: ZOFRAN Inject 2 mLs (4 mg total) into the vein every 6 (six) hours as needed for nausea or vomiting.   oxyCODONE 5 MG immediate release tablet Commonly known as: Oxy IR/ROXICODONE Take 1-2 tablets (5-10 mg total) by mouth every 4 (four) hours as needed for moderate pain (pain score 4-6).    Oxycodone HCl 10 MG Tabs Take 1-1.5 tablets (10-15 mg total) by mouth every 4 (four) hours as needed for severe pain (pain score 7-10).   pantoprazole 40 MG tablet Commonly known as: PROTONIX Take 1 tablet (40 mg total) by mouth daily. Start taking on: Oct 21, 2022   polyethylene glycol 17 g packet Commonly known as: MIRALAX / GLYCOLAX Take 17 g by mouth daily as needed for mild constipation.   potassium chloride SA 20 MEQ tablet Commonly known as: KLOR-CON M Take 1-2 tablets (20-40 mEq total) by mouth daily as needed (low potassium level).   senna-docusate 8.6-50 MG tablet Commonly known as: Senokot-S Take 1 tablet by mouth at bedtime as needed for mild constipation.   zinc sulfate 220 (50 Zn) MG capsule Take 1 capsule (220 mg total) by mouth daily. Start taking on: Oct 21, 2022               Durable Medical Equipment  (From admission, onward)           Start     Ordered   10/14/22 1622  For home use only DME Bedside commode  Once       Comments: Confine to one room  Question:  Patient needs a bedside commode to treat with the following condition  Answer:  S/P BKA (below knee amputation), right (HCC)   10/14/22 1622   10/14/22 1620  For home use only DME Walker rolling  Once       Question Answer Comment  Walker: With 5 Inch Wheels   Patient needs a walker to treat with the following condition S/P BKA (below knee amputation), right (HCC)      10/14/22 1622              Discharge Care Instructions  (From admission, onward)           Start     Ordered   10/20/22 0000  Discharge wound care:       Comments: Stump care   10/20/22 1051            Follow-up Information     [name], MD Follow up in 1 week(s).   Specialty: Orthopedic Surgery Contact information: [address] [place] [address] [phone]         [facility]. Call.   Why: Call and arrange appointment time  for follow  hospitalization and to establish primary care. Contact information: [address] [place] [address] [phone]                 Time coordinating discharge: 39 minutes  Signed:  Sanuel Ladnier  [facility] 10/20/2022, 10:52 AM

## 2022-10-21 DIAGNOSIS — E119 Type 2 diabetes mellitus without complications: Secondary | ICD-10-CM

## 2022-10-21 DIAGNOSIS — S88111S Complete traumatic amputation at level between knee and ankle, right lower leg, sequela: Secondary | ICD-10-CM

## 2022-10-21 LAB — CBC WITH DIFFERENTIAL/PLATELET
Abs Immature Granulocytes: 0.04 10*3/uL (ref 0.00–0.07)
Basophils Absolute: 0.1 10*3/uL (ref 0.0–0.1)
Basophils Relative: 1 %
Eosinophils Absolute: 0 10*3/uL (ref 0.0–0.5)
Eosinophils Relative: 1 %
HCT: 29.9 % — ABNORMAL LOW (ref 39.0–52.0)
Hemoglobin: 9.5 g/dL — ABNORMAL LOW (ref 13.0–17.0)
Immature Granulocytes: 1 %
Lymphocytes Relative: 29 %
Lymphs Abs: 1.8 10*3/uL (ref 0.7–4.0)
MCH: 29.6 pg (ref 26.0–34.0)
MCHC: 31.8 g/dL (ref 30.0–36.0)
MCV: 93.1 fL (ref 80.0–100.0)
Monocytes Absolute: 0.6 10*3/uL (ref 0.1–1.0)
Monocytes Relative: 10 %
Neutro Abs: 3.6 10*3/uL (ref 1.7–7.7)
Neutrophils Relative %: 58 %
Platelets: 334 10*3/uL (ref 150–400)
RBC: 3.21 MIL/uL — ABNORMAL LOW (ref 4.22–5.81)
RDW: 14.5 % (ref 11.5–15.5)
WBC: 6.1 10*3/uL (ref 4.0–10.5)
nRBC: 0 % (ref 0.0–0.2)

## 2022-10-21 LAB — COMPREHENSIVE METABOLIC PANEL
ALT: 18 U/L (ref 0–44)
AST: 19 U/L (ref 15–41)
Albumin: 2.3 g/dL — ABNORMAL LOW (ref 3.5–5.0)
Alkaline Phosphatase: 44 U/L (ref 38–126)
Anion gap: 10 (ref 5–15)
BUN: <5 mg/dL — ABNORMAL LOW (ref 6–20)
CO2: 27 mmol/L (ref 22–32)
Calcium: 8.5 mg/dL — ABNORMAL LOW (ref 8.9–10.3)
Chloride: 96 mmol/L — ABNORMAL LOW (ref 98–111)
Creatinine, Ser: 0.75 mg/dL (ref 0.61–1.24)
GFR, Estimated: >60 mL/min (ref >60)
Glucose, Bld: 114 mg/dL — ABNORMAL HIGH (ref 70–99)
Potassium: 3.7 mmol/L (ref 3.5–5.1)
Sodium: 133 mmol/L — ABNORMAL LOW (ref 135–145)
Total Bilirubin: 0.6 mg/dL (ref 0.3–1.2)
Total Protein: 6.5 g/dL (ref 6.5–8.1)

## 2022-10-21 LAB — GLUCOSE, CAPILLARY
Glucose-Capillary: 115 mg/dL — ABNORMAL HIGH (ref 70–99)
Glucose-Capillary: 144 mg/dL — ABNORMAL HIGH (ref 70–99)
Glucose-Capillary: 152 mg/dL — ABNORMAL HIGH (ref 70–99)
Glucose-Capillary: 170 mg/dL — ABNORMAL HIGH (ref 70–99)

## 2022-10-21 LAB — MAGNESIUM: Magnesium: 1.6 mg/dL — ABNORMAL LOW (ref 1.7–2.4)

## 2022-10-21 MED ORDER — CARBAMIDE PEROXIDE 6.5 % OT SOLN
5.0000 [drp] | Freq: Two times a day (BID) | OTIC | Status: AC
  Administered 2022-10-21: 5 [drp] via OTIC
  Filled 2022-10-21: qty 15

## 2022-10-21 NOTE — Progress Notes (Signed)
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, right, sequela (HCC) Active Problems:   Diabetic gastroparesis (HCC)   Hypomagnesemia   Anemia of chronic disease   Gastroesophageal reflux disease   Past Medical History:  Past Medical History:  Diagnosis Date   Diabetes mellitus    Past Surgical History:  Past Surgical History:  Procedure Laterality Date   AMPUTATION Right 10/10/2022   Procedure: AMPUTATION BELOW KNEE;  Surgeon: [name], MD;  Location: [facility] OR;  Service: Orthopedics;  Laterality: Right;   STUMP REVISION Right 10/13/2022   Procedure: REVISION RIGHT BELOW KNEE AMPUTATION;  Surgeon: [name], MD;  Location: [facility] OR;  Service: Orthopedics;  Laterality: Right;   STUMP REVISION Right 10/15/2022   Procedure: REVISION RIGHT BELOW KNEE AMPUTATION;  Surgeon: [name], MD;  Location: [facility] OR;  Service: Orthopedics;  Laterality: Right;   TEE WITHOUT CARDIOVERSION N/A 10/14/2022   Procedure: TRANSESOPHAGEAL ECHOCARDIOGRAM;  Surgeon: Quintella Reichert, MD;  Location: [facility] INVASIVE CV LAB;  Service: Cardiovascular;  Laterality: N/A;    Assessment & Plan Clinical Impression: Patient is a 47 y.o. year old male with history of DM w/neuropathy but no meds X 1 year, right great toe ulceration and placed on doxycycline per urgent care on 04/11/245 but d/c due to GI intolerance. He presented to the ED after encouraged by daughter and was found to be febrile with T-102.6,  gangrenous changes, foul odor and purulent drainage from wound as well as reports of chills x one week. BS  and lactate elevated, sodium low at 123, acute renal failure with [place]-2.05 and WBC- 27.7.  He was started on IV antibiotics with fluid boluses and required encouragement to utilize insulin for BS control. Dr. Lajoyce Corners recommended R-BKA.  Dr. August Saucer provided 2 nd opinion at behest of patient and concurred with decision.  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 He was found to have strep oralis/mitis bacteremia from foot infection and TEE done was negative for endocarditis.    He was taken back to OR for repeat I and D on 05/08 and 05/10 with downward trend in WBC.  Dr. Daiva Eves following for input and antibiotics narrowed to Unasyn. Dr. Lajoyce Corners recommended long term antibiotics due to persistently elevated WBC  as well as poorly controlled BS as is refusing insulin as well as intermittently refusing antibiotics.   Has been refusing protein supplements, all insulin, vitamins, Mag supplement as well as IV antibiotics for past 24  hours. Multiple providers have been advising compliance and educating patient during his stay.  He did agree to IV antibiotics during hospitalization with transition to oral per prior discussion with Dr. Daiva Eves but has refused antibiotic for past 24+ hours.  Patient reports he thinks the antibiotics will damage his kidneys.  Patient says any oral medications worsen his gastroparesis.  Today Dr. Zenaida Niece dam discussed risks and benefits of antibiotics and that this is unlikely to cause kidney damage.  He also suggested Oritavancin which would provide 7 to 10 days of coverage.  Patient continues to decline  antibiotics.  He reports his last bowel movement was this morning.  PT/OT has been working with patient who continues to be limited by weakness with difficulty off loading LLE and R-BKA. CIR recommended due to functional decline. Patient transferred to CIR on 10/20/2022 .   Patient currently requires  CGA  with mobility secondary to  decreased cardiorespiratoy endurance and decreased postural control and decreased balance strategies.  Prior to hospitalization, patient was independent  with mobility and lived with Daughter in a Other(Comment) (condo) home.  Home access is  Level entry.  Patient will benefit from skilled PT intervention to maximize safe functional mobility, minimize fall risk, and decrease caregiver burden for planned discharge home with intermittent assist.  Anticipate patient will benefit from follow up [facility] at discharge.  PT - End of Session Activity Tolerance: Tolerates 30+ min activity with multiple rests Endurance Deficit: Yes Endurance Deficit Description: decreased PT Assessment Rehab Potential (ACUTE/IP ONLY): Excellent PT Barriers to Discharge: Decreased caregiver support PT Barriers to Discharge Comments: condo, 1 level, level entry with PRN family support PT Patient demonstrates impairments in the following area(s): Furniture conservator/restorer;Sensory PT Transfers Functional Problem(s): Bed Mobility;Bed to Chair;Car PT Locomotion Functional Problem(s): Ambulation;Wheelchair Mobility PT Plan PT Intensity: Minimum of 1-2 x/day ,45 to 90 minutes PT Frequency: 5 out of 7 days PT Duration Estimated Length of Stay: 7-10 days PT Treatment/Interventions: Ambulation/gait training;Discharge planning;DME/adaptive equipment instruction;Functional mobility training;Psychosocial support;Therapeutic Activities;UE/LE Strength taining/ROM;Wheelchair propulsion/positioning;UE/LE Coordination activities;Therapeutic Exercise;Skin care/wound management;Patient/family education;Disease management/prevention;Community reintegration;Balance/vestibular training PT Transfers Anticipated Outcome(s): Mod I PT Locomotion Anticipated Outcome(s): Mod I PT Recommendation Recommendations for Other Services: Other (comment) (TBD) Follow Up Recommendations: Home health PT Patient destination: Home Equipment Recommended: Rolling  walker with 5" wheels;Wheelchair (measurements) Equipment Details: RW, 18 x 18 manual wheelchair   PT Evaluation Precautions/Restrictions Precautions Precautions: Fall Precaution Comments: R BKA Restrictions Weight Bearing Restrictions: Yes Other Position/Activity Restrictions: Limb gaurd General   Vital Signs Pain   Pain Interference Pain Interference Pain Effect on Sleep: 0. Does not apply - I have not had any pain or hurting in the past 5 days Pain Interference with Therapy Activities: 0. Does not apply - I have not received rehabilitationtherapy in the past 5 days Pain Interference with Day-to-Day Activities: 1. Rarely or not at all Home Living/Prior Functioning Home Living Living Arrangements: Children Available Help at Discharge: Family;Available PRN/intermittently;Friend(s) Type of Home: Other(Comment) (condo) Home Access: Level entry Home Layout: One level Bathroom Shower/Tub: Engineer, manufacturing systems: Standard Bathroom Accessibility: Yes  Lives With: Daughter Prior Function Level of Independence: Independent with gait;Independent with transfers  Able to Take Stairs?: Yes Driving: Yes Vision/Perception     Cognition Overall Cognitive Status: Within Functional Limits for tasks assessed Arousal/Alertness: Awake/alert Orientation Level: Oriented X4 Memory: Appears intact Awareness: Appears intact Problem Solving: Appears intact Safety/Judgment: Appears intact Sensation Sensation Light Touch: Impaired by gross assessment Proprioception: Appears Intact Additional Comments: B LE neuropathy (N/T) Coordination Gross Motor Movements are Fluid and Coordinated: No Fine Motor Movements are Fluid and Coordinated: Yes Coordination and Movement Description: grossly uncoordinated 2/2 R BKA Finger Nose Finger Test: [facility] Heel Shin Test: UTA Motor  Motor Motor: Other (comment) (R BKA) Motor - Skilled Clinical Observations: grossly uncoordianted 2/2 R BKA    Trunk/Postural Assessment  Cervical Assessment Cervical Assessment: Within Functional Limits Thoracic Assessment Thoracic Assessment: Within Functional Limits Lumbar Assessment Lumbar Assessment: Within Functional Limits Postural Control Postural Control: Deficits on evaluation Righting Reactions: delayed Protective Responses: delayed  Balance Balance Balance Assessed: Yes  Static Sitting Balance Static Sitting - Balance Support: Feet supported;No upper extremity supported Static Sitting - Level of Assistance: 5: Stand by assistance (supervision) Dynamic Sitting Balance Dynamic Sitting - Balance Support: Feet supported Dynamic Sitting - Level of Assistance: 5: Stand by assistance (supervision) Dynamic Sitting - Balance Activities: Lateral lean/weight shifting;Forward lean/weight shifting;Reaching across midline Static Standing Balance Static Standing - Balance Support: Bilateral upper extremity supported;During functional activity Static Standing - Level of Assistance: 5: Stand by assistance (CGA) Dynamic Standing Balance Dynamic Standing - Balance Support: Bilateral upper extremity supported;During functional activity Dynamic Standing - Level of Assistance: 5: Stand by assistance (CGA) Dynamic Standing - Balance Activities: Lateral lean/weight shifting Extremity Assessment      RLE Assessment RLE Assessment: Exceptions to [facility] General Strength Comments: Grossly 3+/5 LLE Assessment LLE Assessment: Within Functional Limits General Strength Comments: Grossly 4+/5  Care Tool Care Tool Bed Mobility Roll left and right activity   Roll left and right assist level: Supervision/Verbal cueing    Sit to lying activity   Sit to lying assist level: Supervision/Verbal cueing    Lying to sitting on side of bed activity   Lying to sitting on side of bed assist level: the ability to move from lying on the back to sitting on the side of the bed with no back support.:  Supervision/Verbal cueing     Care Tool Transfers Sit to stand transfer   Sit to stand assist level: Contact Guard/Touching assist    Chair/bed transfer   Chair/bed transfer assist level: Contact Guard/Touching assist     Psychologist, clinical transfer assist level: Contact Guard/Touching assist      Care Tool Locomotion Ambulation   Assist level: Contact Guard/Touching assist Assistive device: Walker-rolling Max distance: 25 ft  Walk 10 feet activity   Assist level: Contact Guard/Touching assist Assistive device: Walker-rolling   Walk 50 feet with 2 turns activity Walk 50 feet with 2 turns activity did not occur: Safety/medical concerns (fatigue)      Walk 150 feet activity Walk 150 feet activity did not occur: Safety/medical concerns (fatigue)      Walk 10 feet on uneven surfaces activity   Assist level: Moderate Assistance - Patient - 50 - 74% Assistive device: Walker-rolling  Stairs Stair activity did not occur: Safety/medical concerns (fatigue, R BKA)        Walk up/down 1 step activity Walk up/down 1 step or curb (drop down) activity did not occur: Safety/medical concerns      Walk up/down 4 steps activity Walk up/down 4 steps activity did not occur: Safety/medical concerns      Walk up/down 12 steps activity Walk up/down 12 steps activity did not occur: Safety/medical concerns      Pick up small objects from floor Pick up small object from the floor (from standing position) activity did not occur: Safety/medical concerns (fatigue, R BKA)      Wheelchair Is the patient using a wheelchair?: Yes Type of Wheelchair: Manual   Wheelchair assist level: Supervision/Verbal cueing Max wheelchair distance: 200 ft  Wheel 50 feet with 2 turns activity   Assist Level: Supervision/Verbal cueing  Wheel 150 feet activity   Assist Level: Supervision/Verbal cueing    Refer to Care Plan for Long Term Goals  SHORT TERM GOAL WEEK 1 PT Short Term  Goal 1 (Week 1): STG=LTG's due to ELOS  Recommendations for other services: None   Skilled Therapeutic Intervention Mobility Bed Mobility  Bed Mobility: Rolling Right;Rolling Left;Supine to Sit;Sit to Supine Rolling Right: Supervision/verbal cueing Rolling Left: Supervision/Verbal cueing Supine to Sit: Supervision/Verbal cueing Sit to Supine: Supervision/Verbal cueing Transfers Transfers: Sit to Stand;Stand to Sit;Stand Pivot Transfers Sit to Stand: Contact Guard/Touching assist Stand to Sit: Contact Guard/Touching assist Stand Pivot Transfers: Contact Guard/Touching assist Transfer (Assistive device): Rolling walker Locomotion  Gait Ambulation: Yes Gait Assistance: Contact Guard/Touching assist Gait Distance (Feet): 25 Feet Assistive device: Rolling walker Gait Gait: Yes Gait Pattern: Impaired Gait Pattern: Shuffle;Poor foot clearance - left Gait velocity: decreased Stairs / Additional Locomotion Stairs: No Wheelchair Mobility Wheelchair Mobility: Yes Wheelchair Assistance: Doctor, general practice: Both upper extremities Wheelchair Parts Management: Needs assistance Distance: 200 ft   Treatment Session 1:   Pt received seated edge of bed and agreeable to PT evaluation. Pt oriented to CIR policies and procedures. Pt declines pain in session. PT assessed pain interference, sensation, coordination, strength and balance. Pt requested dressing change and PT notified nurse and case manager. Nurse performed dressing change and wound care and PT demonstrated to patient how to don shrinker and limb guard. PT requires CGA with stand pivot transfer from bed to w/c and with STS from wheelchair with RW. Pt ambulated ~25 ft with RW with decreased left foot clearance and increased reliance of UE's on to walker. Pt transitioned to w/c mobility and propelled w/c ~200 ft with supervision to main gym. Pt CGA with stand pivot to mat and requires supervision with supine  <>sit. Pt transported by w/c to room for energy conservation and left seated in w/c at bedside with all needs in reach and alarm on.    Treatment Session 2:   Pt received seated in w/c at bedside and reports fatigue. Pt declines pain and agreeable to PT session with emphasis on pt education and strength training.   Pt transported dependently for energy conservation by w/c to main gym. PT educated pt on daily skin inspections and provided pt with demo Nurse, mental health to trial. PT recommends pt have 18 x 18 manual w/c and cushion, rolling walker, and skin inspection mirror. Pt agreeable and PT notified social worker.   Pt performed seated knee extension 3 x 10 with right LE and requested to return to room due to fatigue. Pt left semi-reclined in bed with all needs in reach and alarm on.     Discharge Criteria: Patient will be discharged from PT if patient refuses treatment 3 consecutive times without medical reason, if treatment goals not met, if there is a change in medical status, if patient makes no progress towards goals or if patient is discharged from hospital.  The above assessment, treatment plan, treatment alternatives and goals were discussed and mutually agreed upon: by patient  Priscille Kluver PT, DPT  10/21/2022, 11:50 AM

## 2022-10-21 NOTE — Progress Notes (Signed)
RN called into patient room to assess residual limb bandage as serosanguinous fluid leaking from limb protector. RN assessed incision under shrinker;steri strips still in place.  Limb protector pad cleaned of drainage, new dressing of abd pad, kerlix and ace wrap placed on outside of shrinker. Dressing clean;dry and intact

## 2022-10-21 NOTE — Progress Notes (Signed)
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, right, sequela (HCC) Active Problems:   Diabetic gastroparesis (HCC)   Hypomagnesemia   Anemia of chronic disease   Gastroesophageal reflux disease  Past Medical History:  Past Medical History:  Diagnosis Date   Diabetes mellitus    Past Surgical History:  Past Surgical History:  Procedure Laterality Date   AMPUTATION Right 10/10/2022   Procedure: AMPUTATION BELOW KNEE;  Surgeon: [name], MD;  Location: [facility] OR;  Service: Orthopedics;  Laterality: Right;   STUMP REVISION Right 10/13/2022   Procedure: REVISION RIGHT BELOW KNEE AMPUTATION;  Surgeon: [name], MD;  Location: [facility] OR;  Service: Orthopedics;  Laterality: Right;   STUMP REVISION Right 10/15/2022   Procedure: REVISION RIGHT BELOW KNEE AMPUTATION;  Surgeon: [name], MD;  Location: [facility] OR;  Service: Orthopedics;  Laterality: Right;   TEE WITHOUT CARDIOVERSION N/A 10/14/2022   Procedure: TRANSESOPHAGEAL ECHOCARDIOGRAM;  Surgeon: Quintella Reichert, MD;  Location: [facility] INVASIVE CV LAB;  Service: Cardiovascular;  Laterality: N/A;   Social History:  reports that he has never smoked. He does not have any smokeless tobacco history on file. He reports current alcohol use. He reports that he does not use drugs.  Family / Support Systems Marital Status: Single Patient Roles: Parent Children: Autumn 19 yo [phone] Other Supports: nicholas-brother [phone]  sister has started SSD  Tiffany-friend [phone] Anticipated Caregiver: Autumn Ability/Limitations of Caregiver: Daughter works and goes to school pt will need to be mod/i to go home safely Caregiver Availability: Intermittent Family Dynamics: Close with daughter whom lives with him and his siblings. He feels between  them and his friends he has good supports.  Social History Preferred language: English Religion:  Cultural Background: No issues Education: HS Health Literacy - How often do you need to have someone help you when you read instructions, pamphlets, or other written material from your doctor or pharmacy?: Never Writes: Yes Employment Status: Unemployed Date Retired/Disabled/Unemployed: was fired day upon admission to the hospital Legal History/Current Legal Issues: No issues Guardian/Conservator: None-according to MD pt is capable of making his own decisions while here   Abuse/Neglect Abuse/Neglect Assessment Can Be Completed: Yes Physical Abuse: Denies Verbal Abuse: Denies Sexual Abuse: Denies Exploitation of patient/patient's resources: Denies Self-Neglect: Denies  Patient response to: Social Isolation - How often do you feel lonely or isolated from those around you?: Never  Emotional Status Pt's affect, behavior and adjustment status: Pt is motivated to regain his independence he needs to be mod/i to go home safely. He has always been independent and taken care of himself and his daughter. He plans to be again when he leaves here Recent Psychosocial Issues: recently fired from job due to missing work due to being ill. Psychiatric History: No history has a lot going on and would benefit from seeing neuro-psych while here. He still refuses his medications although aware of the consequences. Substance Abuse History: No issues  Patient / Family Perceptions, Expectations & Goals Pt/Family understanding of illness & functional limitations: Pt is able to explain his amputation and issues surrounnding his infection. He talks with the MD's daily and seems to not be able to be convinced of the importance of taking his insulin and IV antibiotics. Premorbid pt/family roles/activities: father, sibling, friend, etc Anticipated changes in roles/activities/participation: resume Pt/family  expectations/goals: Pt states: " I need to be independent when I go home I'll get there."  [employer]: None Premorbid Home Care/DME Agencies: None Transportation available at discharge: Self and daughter Is the patient able to respond to transportation needs?: Yes In the past 12 months, has lack of transportation kept you from medical appointments or from getting medications?: No In the past 12 months, has lack of transportation kept you from meetings, work, or from getting things needed for daily living?: No Resource referrals recommended: Neuropsychology  Discharge Planning Living Arrangements: Children Support Systems: Children, Other relatives, Friends/neighbors Type of Residence: Private residence Insurance Resources: Customer service manager Resources: Other (Comment) (recently unemployed) Surveyor, quantity Screen Referred: Yes Living Expenses: Rent Money Management: Patient Does the patient have any problems obtaining your medications?: No Home Management: self and daughter Patient/Family Preliminary Plans: Return home with daughter who does work and is in school so will not be there most of the time. He will need to be mod/i level to be safe home alone. Aware being evaluated today and goals being set for stay here. Care Coordinator Barriers to Discharge: Decreased caregiver support, Insurance for SNF coverage, Medication compliance Care Coordinator Anticipated Follow Up Needs: HH/OP  Clinical Impression Pleasant gentleman who has his own way of thinking about things. He has refused his IV antibiotics and insulin here. His daughter is supportive and his two siblings are also. Have placed on neuro-psych list to be seen and will work on discharge needs.  Lucy Chris 10/21/2022, 11:19 AM

## 2022-10-21 NOTE — Progress Notes (Signed)
Occupational Therapy Session Note  Patient Details  Name: Carlos [name] MRN: [id number] Date of Birth: 15-Jun-1975  Today's Date: 10/22/2022 OT Individual Time: 1110-1208 OT Individual Time Calculation (min): 58 min    Short Term Goals: Week 1:  OT Short Term Goal 1 (Week 1): STG=LTG's based on LOS  Skilled Therapeutic Interventions/Progress Updates:  Pt received resting in recliner for skilled OT session with focus on BADL retraining. Pt agreeable to interventions, demonstrating overall pleasant mood. Pt with no reports of pain. OT offering intermediate rest breaks and positioning suggestions throughout session to address pain/fatigue and maximize participation/safety in session.   Pt performs all functional transfers with stand-pivot technique, requiring CGA-close supervision. Pt self propels into bathroom for full-shower, requiring overall setup A, leaning laterally for peri-care. Pt setup for UB dressing, requesting to wear only gowns to simplify use of urinal. Pt dependent transitioned out of the bathroom for time management, performing stand-pivot back to recliner. Knee high TED donned for edema management.   Pt remained sitting in recliner with all immediate needs met at end of session. Pt continues to be appropriate for skilled OT intervention to promote further functional independence.   Therapy Documentation Precautions:  Precautions Precautions: Fall Precaution Comments: R BKA Required Braces or Orthoses: Other Brace Other Brace: Right limb guard Restrictions Weight Bearing Restrictions: Yes RLE Weight Bearing: Non weight bearing Other Position/Activity Restrictions: Limb gaurd   Therapy/Group: Individual Therapy  Lou Cal, OTR/L, MSOT  10/22/2022, 11:41 AM

## 2022-10-21 NOTE — Progress Notes (Addendum)
Initial Nutrition Assessment  DOCUMENTATION CODES:   Obesity unspecified  INTERVENTION:  - Continue to monitor PO intakes.   - Add Glucerna Shake po BID, each supplement provides 220 kcal and 10 grams of protein  - Add -1 packet Juven BID, each packet provides 95 calories, 2.5 grams of protein (collagen), and 9.8 grams of carbohydrate (3 grams sugar); also contains 7 grams of L-arginine and L-glutamine, 300 mg vitamin C, 15 mg vitamin E, 1.2 mcg vitamin B-12, 9.5 mg zinc, 200 mg calcium, and 1.5 g  Calcium Beta-hydroxy-Beta-methylbutyrate to support wound healing  - Add MVI q day   NUTRITION DIAGNOSIS:   Inadequate oral intake related to nausea, vomiting as evidenced by meal completion < 25%.  GOAL:   Patient will meet greater than or equal to 90% of their needs  MONITOR:   PO intake  REASON FOR ASSESSMENT:   Malnutrition Screening Tool    ASSESSMENT:   47 y.o. 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 PMH includes: DM.  Meds reviewed:  sliding scale insulin, semglee (30 units), mag-ox. Labs reviewed: Na low, chloride low, mag low.   RD saw pt yesterday. The pt was sleeping at time of assessment. The pt woke up long enough to say that he was eating well. Per MD note, pt has been experiencing nausea and vomiting overnight. RD will continue to monitor PO intakes. Pt also with increased nutrient needs related to wound healing. RD will add supplements.   NUTRITION - FOCUSED PHYSICAL EXAM:  Pt sleeping and fell right back asleep, will attempt at follow up.  Diet Order:   Diet Order             Diet Carb Modified Fluid consistency: Thin; Room service appropriate? Yes  Diet effective now                   EDUCATION NEEDS:   Not appropriate for education at this time  Skin:  Skin Assessment: Skin Integrity Issues: Incisions: R leg  Last BM:  5/16 - type 2  Height:   Ht Readings from Last 1 Encounters:  10/20/22 5\' 9"  (1.753  m)    Weight:   Wt Readings from Last 1 Encounters:  10/20/22 102 kg    Ideal Body Weight:     BMI:  Body mass index is 33.21 kg/m.  Estimated Nutritional Needs:   Kcal:  2000-2200  Protein:  110-125  Fluid:  >/= 2 L  Bethann Humble, RD, LDN, CNSC.

## 2022-10-21 NOTE — Progress Notes (Signed)
Inpatient Rehabilitation  Patient information reviewed and entered into eRehab system by Saide Lanuza Tiffay Pinette, OTR/L, Rehab Quality Coordinator.   Information including medical coding, functional ability and quality indicators will be reviewed and updated through discharge.   

## 2022-10-21 NOTE — Progress Notes (Signed)
Inpatient Rehabilitation Center Individual Statement of Services  Patient Name:  Carlos [name]  Date:  10/21/2022  Welcome to the Inpatient Rehabilitation Center.  Our goal is to provide you with an individualized program based on your diagnosis and situation, designed to meet your specific needs.  With this comprehensive rehabilitation program, you will be expected to participate in at least 3 hours of rehabilitation therapies Monday-Friday, with modified therapy programming on the weekends.  Your rehabilitation program will include the following services:  Physical Therapy (PT), Occupational Therapy (OT), 24 hour per day rehabilitation nursing, Therapeutic Recreaction (TR), Neuropsychology, Care Coordinator, Rehabilitation Medicine, Nutrition Services, and Pharmacy Services  Weekly team conferences will be held on Tuesday to discuss your progress.  Your Inpatient Rehabilitation Care Coordinator will talk with you frequently to get your input and to update you on team discussions.  Team conferences with you and your family in attendance may also be held.  Expected length of stay: 9-11 days  Overall anticipated outcome: Independent with device  Depending on your progress and recovery, your program may change. Your Inpatient Rehabilitation Care Coordinator will coordinate services and will keep you informed of any changes. Your Inpatient Rehabilitation Care Coordinator's name and contact numbers are listed  below.  The following services may also be recommended but are not provided by the Inpatient Rehabilitation Center:  Driving Evaluations Home Health Rehabiltiation Services Outpatient Rehabilitation Services Vocational Rehabilitation   Arrangements will be made to provide these services after discharge if needed.  Arrangements include referral to agencies that provide these services.  Your insurance has been verified to be:  none Your primary doctor is:  none  Pertinent information will be  shared with your doctor and your insurance company.  Inpatient Rehabilitation Care Coordinator:  Dossie Der, [place] [phone] or Luna Glasgow  Information discussed with and copy given to patient by: Lucy Chris, 10/21/2022, 11:20 AM

## 2022-10-21 NOTE — Progress Notes (Signed)
Patient refusing all scheduled medications including antibiotic and insulin.

## 2022-10-21 NOTE — Progress Notes (Signed)
PROGRESS NOTE   Subjective/Complaints:  Pt reports that feels like there's a Sore in his L ear- since saw a little blood when cleaning ear out, put cotton in L ear.  Denies pain from BKA and no phantom pain.   LBM yesterday- no constipation   Voiding "too much" but likely due to DM and not taking insulin.   Doesn't have shrinkers yet- being wrapped with ACE wrap  ROS:  Pt denies SOB, abd pain, CP, N/V/C/D, and vision changes Except for HPI    Objective:   No results found. Recent Labs    10/20/22 0634 10/21/22 0711  WBC 7.0 6.1  HGB 8.4* 9.5*  HCT 26.4* 29.9*  PLT 375 334   Recent Labs    10/19/22 0327 10/20/22 0634  NA 136 134*  K 4.1 4.4  CL 100 97*  CO2 30 30  GLUCOSE 157* 194*  BUN <5* 5*  CREATININE 0.67 0.78  CALCIUM 8.0* 8.0*    Intake/Output Summary (Last 24 hours) at 10/21/2022 0810 Last data filed at 10/21/2022 0242 Gross per 24 hour  Intake --  Output 1900 ml  Net -1900 ml        Physical Exam: Vital Signs Blood pressure (!) 156/88, pulse 97, temperature 98.5 F (36.9 C), temperature source Oral, resp. rate 18, height 5\' 9"  (1.753 m), weight 102 kg, SpO2 97 %.     General: awake, alert, appropriate, seen 2x- sitting up in bed and also at EOB with therapy; NAD HENT: conjugate gaze; oropharynx moist- has a lot of wax in L ear- no bulging of TM B/L- no sore seen on L ear CV: regular rhythm, borderline tachycardic rate; no JVD Pulmonary: CTA B/L; no W/R/R- good air movement GI: soft, NT, ND, (+)BS- normoactive BS Psychiatric: appropriate- very quiet- refusing almost all meds Neurological: Ox3 Neuro:  Alert and oriented, follows commands, CN 2-12 grossly intact, no speech or language deficits noted  Strength 5/5 in b/l UE and LLE Strength 5/5 hip flexion RLE, able to extend R knee to gravity Sensation intact to LT in b/l UE and LLE , intact to LT L residual limb No ataxia or  dysmetria noted Musculoskeletal: No joint swelling or tenderness noted  Skin- R BKA wrapped with ACE wrap and kerlex- C/D/I-  R forearm has healing blisters- ~ 60-70% healed   Assessment/Plan: 1. Functional deficits which require 3+ hours per day of interdisciplinary therapy in a comprehensive inpatient rehab setting. Physiatrist is providing close team supervision and 24 hour management of active medical problems listed below. Physiatrist and rehab team continue to assess barriers to discharge/monitor patient progress toward functional and medical goals  Care Tool:  Bathing              Bathing assist       Upper Body Dressing/Undressing Upper body dressing        Upper body assist      Lower Body Dressing/Undressing Lower body dressing            Lower body assist       Toileting Toileting    Toileting assist       Transfers Chair/bed transfer  Transfers  assist           Locomotion Ambulation   Ambulation assist              Walk 10 feet activity   Assist           Walk 50 feet activity   Assist           Walk 150 feet activity   Assist           Walk 10 feet on uneven surface  activity   Assist           Wheelchair     Assist               Wheelchair 50 feet with 2 turns activity    Assist            Wheelchair 150 feet activity     Assist          Blood pressure (!) 156/88, pulse 97, temperature 98.5 F (36.9 C), temperature source Oral, resp. rate 18, height 5\' 9"  (1.753 m), weight 102 kg, SpO2 97 %.   Medical Problem List and Plan: 1. 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   Antithrombotics: -DVT/anticoagulation:  Pharmaceutical: Lovenox             -antiplatelet therapy: N/A 3. Pain Management: Tylenol prn. 5/16- pt refusing meds- says has no pain/phantom pain  4. Mood/Behavior/Sleep: LCSW to follow for evaluation and support.              -antipsychotic agents: N/A 5. Neuropsych/cognition: This patient IS capable of making decisions on his own behalf. 6. Skin/Wound Care: Routine pressure relief measures.   5/16- will order Shrinkers x2 to wear and to wash for R BKA 7. Fluids/Electrolytes/Nutrition: Monitor I/O. Refusing all meds as it makes him sick             --briefly dicussed need for meds but he declines due to severe reflux/nausea             --will d/c vitamins and colace to simplify regimen 8. Strep oralis/mitis Bacteremia: Has been refusing IV antibiotics and any meds since yesterday pm.    5/16- continues to refuse ABX of any kind- knows he's at risk of becoming septic and mortality if becomes septic- WBC is 6.1- ID has already spoken with him 9. Severe GERD: Has refusing PPI--but after discussion was willing to try IV for relief.  10. T2DM: Hgb A1C- 10.4 , previously had blood sugars over 500 although now appear improved, he continues to refuse insulin.    5/16- CBG's running 115 this AM to 180- educated pt to try and keep CBGs below 150 for improved wound healing- also probably the reason he's voiding so much 11. Hyponatremia: 10/20/2022 NA 134 recheck in am  5/16- pending 12. Anemia of chronic illness?:  10/20/2022 his Hgb was 8.4 recheck in am.  13.  Diabetic gastroparesis.  Refused Reglan 14.  Hypomagnesemia.  Recheck tomorrow   5/16- Refusing all PO meds including Mg- last Mg 1.6- pending this AM 15. L  ear pain  5/16- assessed ear myself- no sore- will order Debrox 5 drops L ear BID x 4 days to clean out wax.     I spent a total of 53   minutes on total care today- >50% coordination of care- due to  Saw pt 2 separate times- spoke with  nursing and PA and reviewed chart- also educated pt about risk if doesn't take insulin or ABX as well as ID and admitting physician- explained that wound care will be slowed in healing due to CBG's being elevated.   LOS: 1 days A FACE TO FACE EVALUATION WAS PERFORMED  Dannel Rafter 10/21/2022, 8:10 AM

## 2022-10-21 NOTE — Plan of Care (Signed)
Problem: RH Balance Goal: LTG Patient will maintain dynamic standing with ADLs (OT) Description: LTG:  Patient will maintain dynamic standing balance with assist during activities of daily living (OT)  Flowsheets (Taken 10/21/2022 1236) LTG: Pt will maintain dynamic standing balance during ADLs with: Independent with assistive device   Problem: Sit to Stand Goal: LTG:  Patient will perform sit to stand in prep for activites of daily living with assistance level (OT) Description: LTG:  Patient will perform sit to stand in prep for activites of daily living with assistance level (OT) Flowsheets (Taken 10/21/2022 1236) LTG: PT will perform sit to stand in prep for activites of daily living with assistance level: Independent with assistive device   Problem: RH Grooming Goal: LTG Patient will perform grooming w/assist,cues/equip (OT) Description: LTG: Patient will perform grooming with assist, with/without cues using equipment (OT) Flowsheets (Taken 10/21/2022 1236) LTG: Pt will perform grooming with assistance level of: Independent with assistive device    Problem: RH Bathing Goal: LTG Patient will bathe all body parts with assist levels (OT) Description: LTG: Patient will bathe all body parts with assist levels (OT) Flowsheets (Taken 10/21/2022 1236) LTG: Pt will perform bathing with assistance level/cueing: Independent with assistive device    Problem: RH Dressing Goal: LTG Patient will perform upper body dressing (OT) Description: LTG Patient will perform upper body dressing with assist, with/without cues (OT). Flowsheets (Taken 10/21/2022 1236) LTG: Pt will perform upper body dressing with assistance level of: Independent with assistive device Goal: LTG Patient will perform lower body dressing w/assist (OT) Description: LTG: Patient will perform lower body dressing with assist, with/without cues in positioning using equipment (OT) Flowsheets (Taken 10/21/2022 1236) LTG: Pt will perform  lower body dressing with assistance level of: Independent with assistive device   Problem: RH Toileting Goal: LTG Patient will perform toileting task (3/3 steps) with assistance level (OT) Description: LTG: Patient will perform toileting task (3/3 steps) with assistance level (OT)  Flowsheets (Taken 10/21/2022 1236) LTG: Pt will perform toileting task (3/3 steps) with assistance level: Independent with assistive device   Problem: RH Simple Meal Prep Goal: LTG Patient will perform simple meal prep w/assist (OT) Description: LTG: Patient will perform simple meal prep with assistance, with/without cues (OT). Flowsheets (Taken 10/21/2022 1236) LTG: Pt will perform simple meal prep with assistance level of: Independent with assistive device LTG: Pt will perform simple meal prep w/level of: Wheelchair level   Problem: RH Laundry Goal: LTG Patient will perform laundry w/assist, cues (OT) Description: LTG: Patient will perform laundry with assistance, with/without cues (OT). Flowsheets (Taken 10/21/2022 1236) LTG: Pt will perform laundry with assistance level of: Independent with assistive device LTG: Pt will perform laundry with level of: Wheelchair level   Problem: RH Light Housekeeping Goal: LTG Patient will perform light housekeeping w/assist (OT) Description: LTG: Patient will perform light housekeeping with assistance, with/without cues (OT). Flowsheets (Taken 10/21/2022 1236) LTG: Pt will perform light housekeeping with assistance level of: Independent with assistive device LTG: Pt will perform light housekeeping w/level of: Wheelchair level   Problem: RH Toilet Transfers Goal: LTG Patient will perform toilet transfers w/assist (OT) Description: LTG: Patient will perform toilet transfers with assist, with/without cues using equipment (OT) Flowsheets (Taken 10/21/2022 1236) LTG: Pt will perform toilet transfers with assistance level of: Independent with assistive device   Problem: RH  Tub/Shower Transfers Goal: LTG Patient will perform tub/shower transfers w/assist (OT) Description: LTG: Patient will perform tub/shower transfers with assist, with/without cues using equipment (OT) Flowsheets (Taken  10/21/2022 1236) LTG: Pt will perform tub/shower stall transfers with assistance level of: Independent with assistive device

## 2022-10-21 NOTE — Progress Notes (Signed)
Orthopedic Tech Progress Note Patient Details:  Carlos [name] 10-28-1975 [id number] Called in order to [facility] for BK shrinker Patient ID: Carlos [name], male   DOB: Mar 12, 1976, 47 y.o.   MRN: [id number]  Carlos [name] 10/21/2022, 8:54 AM

## 2022-10-21 NOTE — Progress Notes (Signed)
Therapy attempted to placed shrinker on RLE residual limb, however old dressing covered in dried serosanguinous drainage. Saline soak used to remove old dressing. Two areas of dehiscence observed on incision both medially and laterally c minimal bleeding. RN placed steri strips and then non adherent gauze followed by therapy application of shrinker. No s/s of infection.

## 2022-10-21 NOTE — Evaluation (Signed)
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, right, sequela (HCC) Active Problems:   Diabetic gastroparesis (HCC)   Hypomagnesemia   Anemia of chronic disease   Gastroesophageal reflux disease   Past Medical History:  Past Medical History:  Diagnosis Date   Diabetes mellitus    Past Surgical History:  Past Surgical History:  Procedure Laterality Date   AMPUTATION Right 10/10/2022   Procedure: AMPUTATION BELOW KNEE;  Surgeon: [name], MD;  Location: [facility] OR;  Service: Orthopedics;  Laterality: Right;   STUMP REVISION Right 10/13/2022   Procedure: REVISION RIGHT BELOW KNEE AMPUTATION;  Surgeon: [name], MD;  Location: [facility] OR;  Service: Orthopedics;  Laterality: Right;   STUMP REVISION Right 10/15/2022   Procedure: REVISION RIGHT BELOW KNEE AMPUTATION;  Surgeon: [name], MD;  Location: [facility] OR;  Service: Orthopedics;  Laterality: Right;   TEE WITHOUT CARDIOVERSION N/A 10/14/2022   Procedure: TRANSESOPHAGEAL ECHOCARDIOGRAM;  Surgeon: Quintella Reichert, MD;  Location: [facility] INVASIVE CV LAB;  Service: Cardiovascular;  Laterality: N/A;    Assessment & Plan Clinical Impression: Yiyang Carlen is a 47 year old male with history of DM w/neuropathy but no meds X 1 year, right great toe ulceration and placed on doxycycline per urgent care on 04/11/245 but d/c due to GI intolerance. He presented to the ED after encouraged by daughter and was found to be febrile with T-102.6,  gangrenous changes, foul odor and purulent drainage from wound as well as reports of chills x one week. BS and lactate  elevated, sodium low at 123, acute renal failure with [place]-2.05 and WBC- 27.7.  He was started on IV antibiotics with fluid boluses and required encouragement to utilize insulin for BS control. Dr. Lajoyce Corners recommended R-BKA.  Dr. August Saucer provided 2 nd opinion at behest of patient and concurred with decision.  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 He was found to have strep oralis/mitis bacteremia from foot infection and TEE done was negative for endocarditis.    He was taken back to OR for repeat I and D on 05/08 and 05/10 with downward trend in WBC.  Dr. Daiva Eves following for input and antibiotics narrowed to Unasyn. Dr. Lajoyce Corners recommended long term antibiotics due to persistently elevated WBC  as well as poorly controlled BS as is refusing insulin as well as intermittently refusing antibiotics.   Has been refusing protein supplements, all insulin, vitamins, Mag supplement as well as IV antibiotics for past 24  hours. Multiple providers have been advising compliance and educating patient during his stay.  He did agree to IV antibiotics during hospitalization with transition to oral per prior discussion with Dr. Daiva Eves but has refused antibiotic for past 24+ hours.  Patient reports he thinks the antibiotics will damage his kidneys.  Patient says any oral medications worsen his gastroparesis.  Today Dr. Zenaida Niece dam discussed risks and benefits of antibiotics and that this is unlikely to cause kidney damage.  He also suggested Oritavancin which would provide 7 to 10 days of coverage.  Patient continues to decline antibiotics.  He reports his last bowel movement was this morning.  PT/OT has been working with patient who continues to be limited by weakness with difficulty off loading LLE and R-BKA. CIR recommended due to functional decline.   Patient currently requires mod with basic self-care skills and IADL secondary to muscle weakness, decreased  cardiorespiratoy endurance, and decreased standing balance, decreased postural control, and decreased balance strategies.  Prior to hospitalization, patient could complete all aspects of A/IADL's incl driving and working remote with independent .  Patient will benefit from skilled intervention to decrease level of assist with basic self-care skills, increase independence with basic self-care skills, and increase level of independence with iADL prior to discharge home with care partner.  Anticipate patient will require 24 hour supervision and follow up outpatient.  OT - End of Session Activity Tolerance: Decreased this session Endurance Deficit: Yes Endurance Deficit Description: decreased OT Assessment Rehab Potential (ACUTE ONLY): Good OT Barriers to Discharge: Inaccessible home environment;Decreased caregiver support;Lack of/limited family support;Weight bearing restrictions;Wound Care OT Barriers to Discharge Comments: family can provide intermittent assist/S, curb to sidewalk OT Patient demonstrates impairments in the following area(s): Motor;Sensory;Balance;Skin Integrity;Nutrition;Edema;Endurance;Safety OT Basic ADL's Functional Problem(s): Grooming;Bathing;Dressing;Toileting OT Advanced ADL's Functional Problem(s): Simple Meal Preparation;Laundry;Light Housekeeping OT Transfers Functional Problem(s): Toilet;Tub/Shower OT Plan OT Intensity: Minimum of 1-2 x/day, 45 to 90 minutes OT Frequency: 5 out of 7 days OT Duration/Estimated Length of Stay: 7-10 d OT Treatment/Interventions: Balance/vestibular training;Community reintegration;Disease mangement/prevention;Neuromuscular re-education;Patient/family education;Self Care/advanced ADL retraining;Splinting/orthotics;Therapeutic Exercise;UE/LE Coordination activities;Wheelchair propulsion/positioning;Discharge planning;DME/adaptive equipment instruction;Functional mobility training;Pain management;Psychosocial support;Skin care/wound  managment;Therapeutic Activities;UE/LE Strength taining/ROM OT Self Feeding Anticipated Outcome(s): indep OT Basic Self-Care Anticipated Outcome(s): mod Indep OT Toileting Anticipated Outcome(s): mod Indep OT Bathroom Transfers Anticipated Outcome(s): mod Indep OT Recommendation Recommendations for Other Services: Neuropsych consult;Therapeutic Recreation consult Therapeutic Recreation Interventions: Pet therapy Patient destination: Home Follow Up Recommendations: Outpatient OT Equipment Recommended: Tub/shower bench;Wheelchair (measurements);Wheelchair cushion (measurements) Equipment Details: TTB, w/c, RW   OT Evaluation Precautions/Restrictions  Precautions Precautions: Fall Precaution Comments: R BKA Required Braces or Orthoses: Other Brace Other Brace: Right limb guard Restrictions Weight Bearing Restrictions: Yes RLE Weight Bearing: Non weight bearing Other Position/Activity Restrictions: Limb gaurd General Chart Reviewed: Yes Family/Caregiver Present: No Vital Signs Therapy Vitals Temp: 98.2 F (36.8 C) Temp Source: Oral Pulse Rate: 97 Resp: 18 BP: 104/68 Patient Position (if appropriate): Sitting Oxygen Therapy SpO2: 99 % O2 Device: Room Air Pain Pain Assessment Pain Scale: 0-10 Pain Score: 0-No pain Home Living/Prior Functioning Home Living Family/patient expects to be discharged to:: Private residence Living Arrangements: Children Available Help at Discharge: Family, Available PRN/intermittently, Friend(s) Type of Home: Other(Comment) (condo) Home Access: Level entry Home Layout: One level Bathroom Shower/Tub: Engineer, manufacturing systems: Standard Bathroom Accessibility: Yes Additional Comments: daughter works and attends school but is in and out throughout the day. 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 Leisure and Hobbies: basketball Prior Function Level of Independence: Independent with gait, Independent with transfers, Independent with homemaking with ambulation, Independent with basic ADLs  Able to Take Stairs?: Yes Driving: Yes Vocation: Full time employment Vision Baseline Vision/History: 1 Wears glasses Ability to See in Adequate Light: 0 Adequate Patient Visual Report: No change  from baseline Vision Assessment?: No apparent visual deficits Perception  Perception: Within Functional Limits Praxis Praxis: Intact Cognition Cognition Overall Cognitive Status: Within Functional Limits for tasks assessed Arousal/Alertness: Awake/alert Memory: Appears intact Awareness: Appears intact Problem Solving: Appears intact Safety/Judgment: Appears intact Brief Interview for Mental Status (BIMS) Repetition of Three Words (First Attempt): 3 Temporal Orientation: Year: Correct Temporal Orientation: Month: Accurate within 5 days Temporal Orientation: Day: Correct Recall: "Sock": Yes, no cue required Recall: "Blue": Yes, no cue required Recall: "Bed": Yes, no cue required BIMS Summary Score: 15 Sensation Sensation Light Touch: Impaired by gross assessment Hot/Cold: Impaired by gross assessment Proprioception: Appears Intact Stereognosis: Appears Intact Additional Comments: B LE neuropathy (N/T) Coordination Gross Motor Movements are Fluid and Coordinated: No Fine Motor Movements are Fluid and Coordinated: Yes Coordination and Movement Description: grossly uncoordinated 2/2 R BKA Finger Nose Finger Test: [facility] Motor  Motor Motor: Other (comment) Motor - Skilled Clinical Observations: grossly uncoordianted  2/2 R BKA  Trunk/Postural Assessment  Cervical Assessment Cervical Assessment: Within Functional Limits Thoracic Assessment Thoracic Assessment: Within Functional Limits Lumbar Assessment Lumbar Assessment: Within Functional Limits Postural Control Postural Control: Deficits on evaluation Righting Reactions: delayed Protective Responses: delayed  Balance Balance Balance Assessed: Yes Static Sitting Balance Static Sitting - Balance Support: Feet supported;No upper extremity supported Static Sitting - Level of Assistance: 5: Stand by assistance Dynamic Sitting Balance Dynamic Sitting - Balance Support: Feet supported Dynamic Sitting - Level of Assistance: 5: Stand by assistance Dynamic Sitting - Balance Activities: Lateral lean/weight shifting;Forward lean/weight shifting;Reaching across midline Sitting balance - Comments: sitting EOB Static Standing Balance Static Standing - Balance Support: Bilateral upper extremity supported;During functional activity Static Standing - Level of Assistance: 5: Stand by assistance Dynamic Standing Balance Dynamic Standing - Balance Support: Bilateral upper extremity supported;During functional activity Dynamic Standing - Level of Assistance: 5: Stand by assistance Dynamic Standing - Balance Activities: Lateral lean/weight shifting Extremity/Trunk Assessment RUE Assessment RUE Assessment: Within Functional Limits LUE Assessment LUE Assessment: Within Functional Limits  Care Tool Care Tool Self Care Eating   Eating Assist Level: Set up assist    Oral Care    Oral Care Assist Level: Set up assist    Bathing   Body parts bathed by patient: Right arm;Left arm;Chest;Abdomen;Front perineal area;Buttocks;Right upper leg;Left upper leg;Left lower leg;Face   Body parts n/a: Right lower leg Assist Level: Moderate Assistance - Patient 50 - 74%    Upper Body Dressing(including orthotics)   What is the patient wearing?: Hospital gown only    Assist Level: Minimal Assistance - Patient > 75%    Lower Body Dressing (excluding footwear)   What is the patient wearing?: Ace wrap/stump shrinker;Hospital gown only Assist for lower body dressing: Maximal Assistance - Patient 25 - 49%    Putting on/Taking off footwear   What is the patient wearing?: Non-skid slipper socks Assist for footwear: Moderate Assistance - Patient 50 - 74%       Care Tool Toileting Toileting activity   Assist for toileting: Minimal Assistance - Patient > 75%     Care Tool Bed Mobility Roll left and right activity   Roll left and right assist level: Supervision/Verbal cueing    Sit to lying activity   Sit to lying assist level: Supervision/Verbal cueing    Lying to sitting on side of bed activity   Lying to sitting on side of bed assist level: the ability to move from lying on the back to sitting on the side of the bed  with no back support.: Supervision/Verbal cueing     Care Tool Transfers Sit to stand transfer   Sit to stand assist level: Contact Guard/Touching assist    Chair/bed transfer   Chair/bed transfer assist level: Contact Guard/Touching assist     Toilet transfer   Assist Level: Minimal Assistance - Patient > 75%     Care Tool Cognition  Expression of Ideas and Wants Expression of Ideas and Wants: 4. Without difficulty (complex and basic) - expresses complex messages without difficulty and with speech that is clear and easy to understand  Understanding Verbal and Non-Verbal Content Understanding Verbal and Non-Verbal Content: 4. Understands (complex and basic) - clear comprehension without cues or repetitions   Memory/Recall Ability Memory/Recall Ability : Current season;Location of own room;Staff names and faces;That he or she is in a hospital/hospital unit   Refer to Care Plan for Long Term Goals  SHORT TERM GOAL WEEK 1 OT Short Term Goal 1 (Week 1): STG=LTG's based on LOS  Recommendations for other services: Neuropsych and  Therapeutic Recreation  Pet therapy, Kitchen group, Stress management, and Outing/community reintegration   Skilled Therapeutic Intervention ADL ADL Equipment Provided: Long-handled sponge Eating: Set up Where Assessed-Eating: Wheelchair Grooming: Setup Where Assessed-Grooming: Wheelchair Upper Body Bathing: Contact guard Where Assessed-Upper Body Bathing: Sitting at sink Lower Body Bathing: Maximal assistance Where Assessed-Lower Body Bathing: Standing at sink;Sitting at sink Upper Body Dressing: Setup Where Assessed-Upper Body Dressing: Sitting at sink Lower Body Dressing: Maximal assistance Where Assessed-Lower Body Dressing: Sitting at sink;Standing at sink Toileting: Minimal assistance Where Assessed-Toileting: Teacher, adult education: Curator Method: Ambulating;Stand Wellsite geologist: Bedside commode;Grab bars Tub/Shower Transfer: Minimal Radiation protection practitioner Method: Conservation officer, historic buildings: Insurance underwriter: Insurance underwriter Method: Designer, industrial/product: Emergency planning/management officer ADL Comments: overall mod-max a LB self care, D shrinker to R residual limb and max A Right limb protector, min A UB self care, set up grooming seated, amb transfer to commode with B rails with min A, TTB with min A lateral scoot Mobility  Bed Mobility Bed Mobility: Rolling Right;Rolling Left;Supine to Sit;Sit to Supine Rolling Right: Supervision/verbal cueing Rolling Left: Supervision/Verbal cueing Supine to Sit: Supervision/Verbal cueing Sit to Supine: Supervision/Verbal cueing Transfers Sit to Stand: Contact Guard/Touching assist Stand to Sit: Contact Guard/Touching assist  OT Treatment/Interventions:   Pt seen for full initial OT evaluation and training session this am. Pt in w/c upon OT arrival. OT introduced role of therapy and purpose of session. OT conducted  comprehensive OT evaluation and treatment session with status outlined in eval and treatment to include toilet retraining, TTB transfer and DME rec, skin protection and pressure reliefs as well as care coordination completed for initiating OT treatment. Pt will benefit from CIR to maximize functional level and safety prior to discharge home with family support. Pt left at end of session up in w/c with chair alarm set, tray table and nurse call bell within reach.    Discharge Criteria: Patient will be discharged from OT if patient refuses treatment 3 consecutive times without medical reason, if treatment goals not met, if there is a change in medical status, if patient makes no progress towards goals or if patient is discharged from hospital.  The above assessment, treatment plan, treatment alternatives and goals were discussed and mutually agreed upon: by patient  Vicenta Dunning 10/21/2022, 3:59 PM

## 2022-10-22 DIAGNOSIS — S88111S Complete traumatic amputation at level between knee and ankle, right lower leg, sequela: Secondary | ICD-10-CM

## 2022-10-22 DIAGNOSIS — F4329 Adjustment disorder with other symptoms: Secondary | ICD-10-CM

## 2022-10-22 DIAGNOSIS — F4320 Adjustment disorder, unspecified: Secondary | ICD-10-CM

## 2022-10-22 LAB — GLUCOSE, CAPILLARY
Glucose-Capillary: 178 mg/dL — ABNORMAL HIGH (ref 70–99)
Glucose-Capillary: 149 mg/dL — ABNORMAL HIGH (ref 70–99)
Glucose-Capillary: 131 mg/dL — ABNORMAL HIGH (ref 70–99)
Glucose-Capillary: 126 mg/dL — ABNORMAL HIGH (ref 70–99)

## 2022-10-22 MED ORDER — ADULT MULTIVITAMIN W/MINERALS CH: 1.0000 | ORAL_TABLET | Freq: Every day | ORAL | Status: DC

## 2022-10-22 MED ORDER — JUVEN PO PACK: 1.0000 | PACK | Freq: Two times a day (BID) | ORAL | Status: DC

## 2022-10-22 MED ORDER — GLUCERNA SHAKE PO LIQD: 237.0000 mL | Freq: Two times a day (BID) | ORAL | Status: DC

## 2022-10-22 NOTE — Consult Note (Signed)
Today I met with Carlos [name] is a 47 year old male referred for acute neuropsychological consultation with patient ongoing refusal to take antibiotics after recent right BKA.  Patient had been noncompliant to insulin and other medical care prior to developing infection in his right lower leg and developed a significant illness with gangrenous changes and ultimately identified to also have necrotizing fasciitis.  Patient has had multiple providers encouraged him to continue with his antibiotics was transitioned to oral antibiotics but patient has continued to refuse antibiotics for the past several days.  Patient is consistently reported his concern over risk of damage to his kidneys.  Patient did have kidney response when he was dealing with the worst of his infection prior to surgery and the risk to his kidneys from infection is likely greater than any risk from antibiotic use.  However, the patient has become quite steadfast in his refusal to take antibiotics at this point I suspect that the more discussions that go on the more the patient will hear pieces of information that he will use to backup his decision rather than a persuasive argument changing his stance regarding continued antibiotic.  Patient displayed a rather flat affect but denied any attempts to self-harm or passively kill himself.  Patient did agree that if he shows blood work indicating an increase or rise in white blood cell counts or other indications of recurrence of infection that he would agree to antibiotic reintroduction.  I am not sure if further discussions from infectious disease will help at this point as the patient is selectively hearing information rather than taking into consideration all of the information that is being provided to him.  Patient is frustrated by having multiple people, and addressed this and I suspect it is having increasing loss of impact on his decision making.  Will need to continue monitoring for recurrence of  infection quite closely and the patient has agreed to restart antibiotics if there are any indications that infection has reoccurred.

## 2022-10-22 NOTE — Progress Notes (Signed)
Physical Therapy Session Note  Patient Details  Name: Carlos [name] MRN: [id number] Date of Birth: 10-07-1975  Today's Date: 10/22/2022 PT Individual Time: 4098-1191 + 1350-1345 PT Individual Time Calculation (min): 45 min +  Short Term Goals: Week 1:  PT Short Term Goal 1 (Week 1): STG=LTG's due to ELOS  Skilled Therapeutic Interventions/Progress Updates:     SESSION 1: Pt presents in room in bed, reporting increased abdominal pain and nausea, decline intervention at this time. Pt agreeable to PT, denies pain in residual limb. Session completed in room secondary to pt abdominal discomfort and nausea. Session focused on therex for RLE, education on new limb loss, nutrition to promote healing, precautions, and expected mobility outcomes at DC with pt verbalizing understanding. Pt completes sit<>stands with CGA throughout session to RW.  Therapist dons thigh high TED hose with pt sitting EOB, max assist for pulling up lower leg, pt able to pull up thigh to hip. Total assist for donning pt sock for time management. Pt dons limb guard with mod assist sitting EOB, limb guard falling off with upright mobility.  Pt completes standing and seated therex for RLE open chain strengthening including SLR x10 Hip abduction x10 Hip extension x10 R LAQ x10  Pt reporting during session that he does not take medications such as pain medicine, antibiotics or insulin secondary to GI intolerance. Pt also reports that he does not eat lunch due to GI and had a very specific diet that he is unable to maintain while in hospital. Pt educated on importance of nutrition such as protein intake for wound healing following limb loss as well as regulating blood glucose to allow for wound healing with pt verbalizing understanding. Pt discussed biggest deficits at this time are with endurance but feels confident with mobility. Pt educated on safety with mobility such as using WC for primary mobility, RW for transfers or  short distances to improve outcome with gait training once receiving prosthesis with pt agreeable and verbalizing understanding. Pt educated on reducing contracture risk and maintaining ROM and strength for return to walking with pt agreeable to trial therex.  Pt ambulates from EOB to recliner in room 10' with RW CGA good LLE foot clearance for hopping. Pt remains in recliner with legs extended with all needs within reach, call light in place at end of session.    SESSION 2: Pt presents in room in recliner, reporting feeling slightly better with abdominal pain and nausea compared to this morning. Pt denies pain in residual limb. Session focused on completing prone/supine therex and education on HEP to promote BLE strengthening and ROM needed for functional transfers and return to functional ambulation. Pt completes sit<>stand transfers CGA and squat pivot transfers with CGA throughout session. Pt completes all bed mobility modI for sit<>supine and supine<>prone during session.  Pt self propels WC 150' from room to main gym with supervision, verbal cues for [facility] propulsion technique with pt demonstrating R path deviation secondary to WC alignment and new limb loss. Pt educated on this path deviation with pt able to correct.  Pt completes prone/supine therex for BLE strengthening and ROM with verbal/tactile cues provided throughout for desired muscular activation including: Access Code: [email or web address] URL: [email or web address] Date: 10/22/2022 Prepared by: Edwin Cap Exercises - Prone Glute Set  - 2 x daily - 7 x weekly - 1 sets - 10 reps - Prone Knee Flexion  - 2 x daily - 7 x weekly - 1 sets - 10 reps - 1 hold -  Prone Hip Extension with Residual Limb (BKA)  - 2 x daily - 7 x weekly - 1 sets - 10 reps - Prone Press Up  - 2 x daily - 7 x weekly - 1 sets - 10 reps - 1 hold - Supine Single Leg Bridge with Sound Leg (BKA)  - 1 x daily - 7 x weekly - 1 sets - 10 reps - 1 hold - supine hip  abduction RLE x10 (not added to HEP  Pt transitions to sitting EOB and transfers to [facility]. Pt educated on WC parts management for leg rests with min assist for removing and placing bilateral WC leg rests, provided with mod verbal cues and demonstration during session.  Pt self propels from main gym to ortho gym with good recall of propulsion technique however endorses increased abdominal pain, declines intervention and agreeable to continue with session.  Pt positioned at UE ergometer and completes x50min forward at level 1 for UE endurance needed for propelling WC and functional mobility.  Pt transported back to room and ambulates to recliner in room with CGA and RW. Pt remains seated with all needs within reach, call light in place, and NT present in room at end of session.  Therapy Documentation Precautions:  Precautions Precautions: Fall Precaution Comments: R BKA Required Braces or Orthoses: Other Brace Other Brace: Right limb guard Restrictions Weight Bearing Restrictions: Yes RLE Weight Bearing: Non weight bearing Other Position/Activity Restrictions: Limb gaurd   Therapy/Group: Individual Therapy  Edwin Cap PT, DPT 10/22/2022, 12:52 PM

## 2022-10-22 NOTE — Progress Notes (Signed)
Occupational Therapy Session Note  Patient Details  Name: Carlos [name] MRN: [id number] Date of Birth: Jan 28, 1976  Today's Date: 10/22/2022 OT Individual Time: 1500-1530 OT Individual Time Calculation (min): 30 min    Short Term Goals: Week 1:  OT Short Term Goal 1 (Week 1): STG=LTG's based on LOS  Skilled Therapeutic Interventions/Progress Updates:    Patient agreeable to participate in OT session. Reports stomach pain/discomfort. Pt's chart reviewed during session with report of vomiting during the night. Monitored pain during session.   Patient participated in skilled OT session focusing on establishing HEP focusing on BUE strengthening in order to increase functional performance during transfers. VC and visual demonstrating provided with handout for reference. Pt was able to return demonstration and verbalized understanding.   Strengthening:  - seated, green band, BUE, shoulder IR/er, PNF 2, horizontal abduction/adduction, adduction, 15X, 1 set.  Discussed curb in parking lot at apartment to determine if there is a curb cut out from parking lot or not which will determine if pt will need to hop a curb up/down from apartment entrance.  Google Maps showed one handicap spot on the opposite side of the parking lot where pt lives and the only curb cut out is locating there.   Pt completed functional transfers (recliner to w/c then w/c to toilet with SBA. Toileting completed with set-up. At end of session, pt performed sqt pvt transfer from w/c to bed with SBA. No device used besides bed rail.       Therapy Documentation Precautions:  Precautions Precautions: Fall Precaution Comments: R BKA Required Braces or Orthoses: Other Brace Other Brace: Right limb guard Restrictions Weight Bearing Restrictions: Yes RLE Weight Bearing: Non weight bearing Other Position/Activity Restrictions: Limb gaurd   Therapy/Group: Individual Therapy  Limmie Patricia, OTR/L,CBIS  Supplemental OT -  [facility] Secure Chat Preferred   10/22/2022, 8:04 AM

## 2022-10-22 NOTE — Patient Instructions (Signed)

## 2022-10-22 NOTE — Progress Notes (Signed)
PROGRESS NOTE   Subjective/Complaints:  Got shrinkers.   Nausea and vomiting overnight.   Usually eats breakfast and sometimes dinner, but ate lunch yesterday thinking to give him energy, but vomiting up lunch.   Feeling bad from GERD and nausea/vomiting.  Tried laxatives in past- just irritates his stomach";    ROS:   Pt denies SOB, (+) abd pain, CP, N/V/C/D, and vision changes  Except for HPI    Objective:   No results found. Recent Labs    10/20/22 0634 10/21/22 0711  WBC 7.0 6.1  HGB 8.4* 9.5*  HCT 26.4* 29.9*  PLT 375 334   Recent Labs    10/20/22 0634 10/21/22 0711  NA 134* 133*  K 4.4 3.7  CL 97* 96*  CO2 30 27  GLUCOSE 194* 114*  BUN 5* <5*  CREATININE 0.78 0.75  CALCIUM 8.0* 8.5*    Intake/Output Summary (Last 24 hours) at 10/22/2022 0847 Last data filed at 10/22/2022 0731 Gross per 24 hour  Intake 1080 ml  Output 950 ml  Net 130 ml        Physical Exam: Vital Signs Blood pressure (!) 152/84, pulse 94, temperature 98 F (36.7 C), resp. rate 16, height 5\' 9"  (1.753 m), weight 102 kg, SpO2 98 %.      General: awake, alert, appears in abdominal pain; hunched over in bed; foot hanging over EOB; NAD HENT: conjugate gaze; oropharynx moist CV: regular rate and rhythm; no JVD Pulmonary: CTA B/L; no W/R/R- good air movement GI: soft, NT, ND, (+)BS Psychiatric: appropriate- but refusing all meds/interventions Neurological: Ox3  Neuro:  Alert and oriented, follows commands, CN 2-12 grossly intact, no speech or language deficits noted  Strength 5/5 in b/l UE and LLE Strength 5/5 hip flexion RLE, able to extend R knee to gravity Sensation intact to LT in b/l UE and LLE , intact to LT L residual limb No ataxia or dysmetria noted Musculoskeletal: No joint swelling or tenderness noted  Skin- R BKA wrapped with ACE wrap and kerlex- C/D/I-  R forearm has healing blisters- ~ 60-70%  healed   Assessment/Plan: 1. Functional deficits which require 3+ hours per day of interdisciplinary therapy in a comprehensive inpatient rehab setting. Physiatrist is providing close team supervision and 24 hour management of active medical problems listed below. Physiatrist and rehab team continue to assess barriers to discharge/monitor patient progress toward functional and medical goals  Care Tool:  Bathing    Body parts bathed by patient: Right arm, Left arm, Chest, Abdomen, Front perineal area, Buttocks, Right upper leg, Left upper leg, Left lower leg, Face     Body parts n/a: Right lower leg   Bathing assist Assist Level: Moderate Assistance - Patient 50 - 74%     Upper Body Dressing/Undressing Upper body dressing   What is the patient wearing?: Hospital gown only    Upper body assist Assist Level: Minimal Assistance - Patient > 75%    Lower Body Dressing/Undressing Lower body dressing      What is the patient wearing?: Ace wrap/stump shrinker, Hospital gown only     Lower body assist Assist for lower body dressing: Maximal Assistance - Patient  25 - 49%     Toileting Toileting    Toileting assist Assist for toileting: Minimal Assistance - Patient > 75%     Transfers Chair/bed transfer  Transfers assist     Chair/bed transfer assist level: Contact Guard/Touching assist     Locomotion Ambulation   Ambulation assist      Assist level: Contact Guard/Touching assist Assistive device: Walker-rolling Max distance: 25 ft   Walk 10 feet activity   Assist     Assist level: Contact Guard/Touching assist Assistive device: Walker-rolling   Walk 50 feet activity   Assist Walk 50 feet with 2 turns activity did not occur: Safety/medical concerns (fatigue)         Walk 150 feet activity   Assist Walk 150 feet activity did not occur: Safety/medical concerns (fatigue)         Walk 10 feet on uneven surface  activity   Assist      Assist level: Moderate Assistance - Patient - 50 - 74% Assistive device: Photographer Is the patient using a wheelchair?: Yes Type of Wheelchair: Manual    Wheelchair assist level: Supervision/Verbal cueing Max wheelchair distance: 200 ft    Wheelchair 50 feet with 2 turns activity    Assist        Assist Level: Supervision/Verbal cueing   Wheelchair 150 feet activity     Assist      Assist Level: Supervision/Verbal cueing   Blood pressure (!) 152/84, pulse 94, temperature 98 F (36.7 C), resp. rate 16, height 5\' 9"  (1.753 m), weight 102 kg, SpO2 98 %.   Medical Problem List and Plan: 1. 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  Antithrombotics: -DVT/anticoagulation:  Pharmaceutical: Lovenox             -antiplatelet therapy: N/A 3. Pain Management: Tylenol prn. 5/16- pt refusing meds- says has no pain/phantom pain  4. Mood/Behavior/Sleep: LCSW to follow for evaluation and support.              -antipsychotic agents: N/A 5. Neuropsych/cognition: This patient IS capable of making decisions on his own behalf. 6. Skin/Wound Care: Routine pressure relief measures.   5/16- will order Shrinkers x2 to wear and to wash for R BKA  5/17- got shrinkers 7. Fluids/Electrolytes/Nutrition: Monitor I/O. Refusing all meds as it makes him sick             --briefly dicussed need for meds but he declines due to severe reflux/nausea             --will d/c vitamins and colace to simplify regimen 8. Strep oralis/mitis Bacteremia: Has been refusing IV antibiotics and any meds since yesterday pm.    5/16- continues to refuse ABX of any kind- knows he's at risk of becoming septic and mortality if becomes  septic- WBC is 6.1- ID has already spoken with him 9. Severe GERD: Has refusing PPI--but after discussion was willing to try IV for relief.   5/17- refusing any PPI 10. T2DM: Hgb A1C- 10.4 , previously had blood sugars over 500  although now appear improved, he continues to refuse insulin.    5/16- CBG's running 115 this AM to 180- educated pt to try and keep CBGs below 150 for improved wound healing- also probably the reason he's voiding so much  5/17- CBGs 115-178- refusing all SSI, etc- says he usually eats just breakfast/dinner, but ate lunch yesterday.  11. Hyponatremia: 10/20/2022 NA 134 recheck in am  5/16- pending  5/17- Na 133-  12. Anemia of chronic illness?:  10/20/2022 his Hgb was 8.4 recheck in am.   5/17- Hb up to 9.5 13.  Diabetic gastroparesis.  Refused Reglan 14.  Hypomagnesemia.  Recheck tomorrow   5/16- Refusing all PO meds including Mg- last Mg 1.6- pending this AM  5/17- Mg 1.6- refusing any intervention 15. L ear pain  5/16- assessed ear myself- no sore- will order Debrox 5 drops L ear BID x 4 days to clean out wax.     I spent a total of 45   minutes on total care today- >50% coordination of care- due to  D/w pt about GERD, as well as gastroparesis and CBGs and low Mg- needs repletion.   LOS: 2 days A FACE TO FACE EVALUATION WAS PERFORMED  Lindley Stachnik 10/22/2022, 8:47 AM

## 2022-10-23 LAB — GLUCOSE, CAPILLARY
Glucose-Capillary: 111 mg/dL — ABNORMAL HIGH (ref 70–99)
Glucose-Capillary: 124 mg/dL — ABNORMAL HIGH (ref 70–99)
Glucose-Capillary: 121 mg/dL — ABNORMAL HIGH (ref 70–99)

## 2022-10-23 NOTE — Progress Notes (Signed)
I went to the patient room to respond to the bed alarm. Up on entering the room, the patient  was sitting at the edge of the bed. Patient  was very upset because I went in his room earlier to check on him and ask  about his night time medications. Patient stated " are you the nurse that came in here earlier? You know that I do have hard time sleeping because of my Gastroparesis, and for you to come in the room just to ask me how I was doing, and about the medications was wrong. Everybody knows that I don't take any medication here. Next time please don't come in my room for that". I discussed with the patient the fact that I was fulfilling my duty to assess him and make sure everything is ok with him. As for the medications I have to confirm the refusal by offering him his scheduled medications, but I cannot just assume that he will refuse.

## 2022-10-23 NOTE — Progress Notes (Signed)
Pt continues to refuse all medications including glucerna shakes and Lovenox. Pt also refused dinner tray and requested that nurse removes from his room. Nurse offered to order different food and was asked to leave patients room.

## 2022-10-23 NOTE — IPOC Note (Signed)
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, right, sequela (HCC) Active Problems:   Diabetic gastroparesis (HCC)   Hypomagnesemia   Anemia of chronic disease   Gastroesophageal reflux disease   Adjustment disorder     Functional Problem List: Nursing Bowel, Safety, Edema, Endurance, Medication Management, Pain  PT Balance, Edema, Skin Integrity, Sensory  OT Motor, Sensory, Balance, Skin Integrity, Nutrition, Edema, Endurance, Safety  SLP    TR         Basic ADL's: OT Grooming, Bathing, Dressing, Toileting     Advanced  ADL's: OT Simple Meal Preparation, Laundry, Light Housekeeping     Transfers: PT Bed Mobility, Bed to Chair, Customer service manager, Tub/Shower     Locomotion: PT Ambulation, Wheelchair Mobility     Additional Impairments: OT    SLP        TR      Anticipated Outcomes Item Anticipated Outcome  Self Feeding indep  Swallowing      Basic self-care  mod Indep  Toileting  mod Indep   Bathroom Transfers mod Indep  Bowel/Bladder  continent B/B  Transfers  Mod I  Locomotion  Mod I  Communication     Cognition     Pain  less than  Safety/Judgment  remain fall free   Therapy Plan: PT Intensity: Minimum of 1-2 x/day ,45 to 90 minutes PT Frequency: 5 out of 7 days PT Duration Estimated Length of Stay: 7-10 days OT Intensity: Minimum of 1-2 x/day, 45 to 90 minutes OT Frequency: 5 out of 7 days OT Duration/Estimated Length of Stay: 7-10 d     Team Interventions: Nursing Interventions Patient/Family Education, Pain Management, Medication Management, Discharge Planning, Bowel Management, Skin Care/Wound Management, Disease Management/Prevention  PT interventions Ambulation/gait training, Discharge planning,  DME/adaptive equipment instruction, Functional mobility training, Psychosocial support, Therapeutic Activities, UE/LE Strength taining/ROM, Wheelchair propulsion/positioning, UE/LE Coordination activities, Therapeutic Exercise, Skin care/wound management, Patient/family education, Disease management/prevention, Firefighter, Warden/ranger  OT Interventions Warden/ranger, Community reintegration, Disease mangement/prevention, Chief of Staff, Equities trader education, Self Care/advanced ADL retraining, Splinting/orthotics, Therapeutic Exercise, UE/LE Coordination activities, Wheelchair propulsion/positioning, Discharge planning, DME/adaptive equipment instruction, Functional mobility training, Pain management, Psychosocial support, Skin care/wound managment, Therapeutic Activities, UE/LE Strength taining/ROM  SLP Interventions    TR Interventions    SW/CM Interventions Discharge Planning, Psychosocial Support, Patient/Family Education   Barriers to Discharge MD  Medical stability, Home enviroment access/loayout, Wound care, Lack of/limited family support, Weight, Weight bearing restrictions, Medication compliance, and Behavior  Nursing Decreased caregiver support, New diabetic, Wound Care, Lack of/limited family support, Weight, Weight bearing restrictions home with spouse to 1 level with level entry  PT Decreased caregiver support condo, 1 level, level entry with PRN family support  OT Inaccessible home environment, Decreased caregiver support, Lack of/limited family support, Weight bearing restrictions, Wound Care family can provide intermittent assist/S, curb to sidewalk  SLP      SW Decreased caregiver support, Insurance for SNF coverage, Medication compliance     Team Discharge Planning: Destination: PT-Home ,OT- Home , SLP-  Projected Follow-up: PT-Home health PT, OT-  Outpatient OT, SLP-  Projected Equipment Needs: PT-Rolling walker with  5" wheels, Wheelchair (measurements), OT- Tub/shower bench, Wheelchair (measurements), Wheelchair cushion (measurements), SLP-  Equipment Details: PT-RW, 18 x 18 manual wheelchair, OT-TTB, w/c, RW Patient/family involved  in discharge planning: PT- Patient,  OT-Patient, SLP-   MD ELOS: 7-10 days Medical Rehab Prognosis:  Good Assessment: The patient has been admitted for CIR therapies with the diagnosis of R BKA. The team will be addressing functional mobility, strength, stamina, balance, safety, adaptive techniques and equipment, self-care, bowel and bladder mgt, patient and caregiver education, wound care. Goals have been set at mod I. Anticipated discharge destination is home.        See Team Conference Notes for weekly updates to the plan of care

## 2022-10-23 NOTE — Progress Notes (Signed)
PROGRESS NOTE   Subjective/Complaints:  Pt reports upset that nursing checked on him/woke him up last night- was prone in bed per nursing.   Said only ate applesauce yesterday but plans on eating breakfast this AM.   Abd pain as usual.   Said pees all the time- q45-60 minutes- only at night.   Explained if started meds to reduce voiding, would cause urinary retention.  However it's likely due to retention in legs- and equilibrates when lays down at night.   ROS:   Pt denies SOB,  (+)abd pain, CP, N/V/C/D, and vision changes   Except for HPI    Objective:   No results found. Recent Labs    10/21/22 0711  WBC 6.1  HGB 9.5*  HCT 29.9*  PLT 334   Recent Labs    10/21/22 0711  NA 133*  K 3.7  CL 96*  CO2 27  GLUCOSE 114*  BUN <5*  CREATININE 0.75  CALCIUM 8.5*    Intake/Output Summary (Last 24 hours) at 10/23/2022 1159 Last data filed at 10/23/2022 0900 Gross per 24 hour  Intake 1070 ml  Output 2300 ml  Net -1230 ml        Physical Exam: Vital Signs Blood pressure (!) 154/88, pulse 100, temperature 98.3 F (36.8 C), temperature source Oral, resp. rate 18, height 5\' 9"  (1.753 m), weight 102 kg, SpO2 96 %.       General: awake, alert, appropriate,  appears uncomfortable- hunched over somewhat holding stomach- therapy in room; NAD HENT: conjugate gaze; oropharynx moist CV: regular rate; no JVD Pulmonary: CTA B/L; no W/R/R- good air movement GI: soft, NT, ND, (+)BS- normoactive BS Psychiatric: appropriate- refusing all meds Neurological: Ox3 L BKA with shrinker in place  Neuro:  Alert and oriented, follows commands, CN 2-12 grossly intact, no speech or language deficits noted  Strength 5/5 in b/l UE and LLE Strength 5/5 hip flexion RLE, able to extend R knee to gravity Sensation intact to LT in b/l UE and LLE , intact to LT L residual limb No ataxia or dysmetria noted Musculoskeletal: No  joint swelling or tenderness noted  Skin- R BKA wrapped with ACE wrap and kerlex- C/D/I-  R forearm has healing blisters- ~ 60-70% healed   Assessment/Plan: 1. Functional deficits which require 3+ hours per day of interdisciplinary therapy in a comprehensive inpatient rehab setting. Physiatrist is providing close team supervision and 24 hour management of active medical problems listed below. Physiatrist and rehab team continue to assess barriers to discharge/monitor patient progress toward functional and medical goals  Care Tool:  Bathing    Body parts bathed by patient: Right arm, Left arm, Chest, Abdomen, Front perineal area, Buttocks, Right upper leg, Left upper leg, Left lower leg, Face     Body parts n/a: Right lower leg   Bathing assist Assist Level: Moderate Assistance - Patient 50 - 74%     Upper Body Dressing/Undressing Upper body dressing   What is the patient wearing?: Hospital gown only    Upper body assist Assist Level: Minimal Assistance - Patient > 75%    Lower Body Dressing/Undressing Lower body dressing      What  is the patient wearing?: Ace wrap/stump shrinker, Hospital gown only     Lower body assist Assist for lower body dressing: Maximal Assistance - Patient 25 - 49%     Toileting Toileting    Toileting assist Assist for toileting: Minimal Assistance - Patient > 75%     Transfers Chair/bed transfer  Transfers assist     Chair/bed transfer assist level: Contact Guard/Touching assist     Locomotion Ambulation   Ambulation assist      Assist level: Contact Guard/Touching assist Assistive device: Walker-rolling Max distance: 25 ft   Walk 10 feet activity   Assist     Assist level: Contact Guard/Touching assist Assistive device: Walker-rolling   Walk 50 feet activity   Assist Walk 50 feet with 2 turns activity did not occur: Safety/medical concerns (fatigue)         Walk 150 feet activity   Assist Walk 150 feet  activity did not occur: Safety/medical concerns (fatigue)         Walk 10 feet on uneven surface  activity   Assist     Assist level: Moderate Assistance - Patient - 50 - 74% Assistive device: Photographer Is the patient using a wheelchair?: Yes Type of Wheelchair: Manual    Wheelchair assist level: Supervision/Verbal cueing Max wheelchair distance: 200 ft    Wheelchair 50 feet with 2 turns activity    Assist        Assist Level: Supervision/Verbal cueing   Wheelchair 150 feet activity     Assist      Assist Level: Supervision/Verbal cueing   Blood pressure (!) 154/88, pulse 100, temperature 98.3 F (36.8 C), temperature source Oral, resp. rate 18, height 5\' 9"  (1.753 m), weight 102 kg, SpO2 96 %.   Medical Problem List and Plan: 1. 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  Antithrombotics: -DVT/anticoagulation:  Pharmaceutical: Lovenox             -antiplatelet therapy: N/A 3. Pain Management: Tylenol prn. 5/16- pt refusing meds- says has no pain/phantom pain  4. Mood/Behavior/Sleep: LCSW to follow for evaluation and support.              -antipsychotic agents: N/A 5. Neuropsych/cognition: This patient IS capable of making decisions on his own behalf. 6. Skin/Wound Care: Routine pressure relief measures.   5/16- will order Shrinkers x2 to wear and to wash for R BKA  5/17- got shrinkers 7. Fluids/Electrolytes/Nutrition: Monitor I/O. Refusing all meds as it makes him sick             --briefly dicussed need for meds but he declines due to severe reflux/nausea             --will d/c vitamins and colace to simplify regimen 8. Strep oralis/mitis Bacteremia: Has been refusing IV  antibiotics and any meds since yesterday pm.    5/16- continues to refuse ABX of any kind- knows he's at risk of becoming septic and mortality if becomes septic- WBC is 6.1- ID has already spoken with him  5/18- labs q Monday to f/u  on CBC 9. Severe GERD: Has refusing PPI--but after discussion was willing to try IV for relief.   5/17- refusing any PPI  5/18- still refusing 10. T2DM: Hgb A1C- 10.4 , previously had blood sugars over 500 although now appear improved, he continues to refuse insulin.    5/16- CBG's running 115 this AM to 180- educated pt to try and keep CBGs below 150 for improved wound healing- also probably the reason he's voiding so much  5/17- CBGs 115-178- refusing all SSI, etc- says he usually eats just breakfast/dinner, but ate lunch yesterday. 5.18- refusing all insulin-  CBGs 111-178- con't to encourage pt to take meds 11. Hyponatremia: 10/20/2022 NA 134 recheck in am  5/16- pending  5/17- Na 133-  12. Anemia of chronic illness?:  10/20/2022 his Hgb was 8.4 recheck in am.   5/17- Hb up to 9.5 13.  Diabetic gastroparesis.  Refused Reglan  5/18- having a lot of gastroparesis- however doesn't want laxatives or Reglan.  14.  Hypomagnesemia.  Recheck tomorrow   5/16- Refusing all PO meds including Mg- last Mg 1.6- pending this AM  5/17- Mg 1.6- refusing any intervention  5/18- pt still refusing meds 15. L ear pain  5/16- assessed ear myself- no sore- will order Debrox 5 drops L ear BID x 4 days to clean out wax.     I spent a total of 37   minutes on total care today- >50% coordination of care- due to   D/w nursing about pt's being upset due to checking on him- went over details- also pt seen and examined and IPOC done.   LOS: 3 days A FACE TO FACE EVALUATION WAS PERFORMED  Matilyn Fehrman 10/23/2022, 11:59 AM

## 2022-10-23 NOTE — Progress Notes (Signed)
Occupational Therapy Session Note  Patient Details  Name: Carlos [name] MRN: [id number] Date of Birth: [birth year]  Today's Date: 10/23/2022 OT Individual Time:: missed 45 min due to GI issues       Short Term Goals: Week 1:  OT Short Term Goal 1 (Week 1): STG=LTG's based on LOS  Skilled Therapeutic Interventions/Progress Updates:    Attempted OT session and pt in bed with lights off in prone. Pt reports GI issues and nursing aware. Refused tx. Will attempt make up minutes as able.   Therapy Documentation Precautions:  Precautions Precautions: Fall Precaution Comments: R BKA Required Braces or Orthoses: Other Brace Other Brace: Right limb guard Restrictions Weight Bearing Restrictions: Yes RLE Weight Bearing: Weight bearing as tolerated Other Position/Activity Restrictions: Limb gaurd    Therapy/Group: Individual Therapy  Vicenta Dunning 10/23/2022, 2:42 PM

## 2022-10-24 LAB — GLUCOSE, CAPILLARY
Glucose-Capillary: 105 mg/dL — ABNORMAL HIGH (ref 70–99)
Glucose-Capillary: 121 mg/dL — ABNORMAL HIGH (ref 70–99)
Glucose-Capillary: 171 mg/dL — ABNORMAL HIGH (ref 70–99)
Glucose-Capillary: 251 mg/dL — ABNORMAL HIGH (ref 70–99)

## 2022-10-24 NOTE — Progress Notes (Signed)
Occupational Therapy Session Note  Patient Details  Name: Carlos [name] MRN: [id number] Date of Birth: 1975-07-09  {CHL IP REHAB OT TIME CALCULATIONS:304400400}  {CHL IP REHAB OT TIME CALCULATIONS:304400400}  Short Term Goals: Week 1:  OT Short Term Goal 1 (Week 1): STG=LTG's based on LOS  Skilled Therapeutic Interventions/Progress Updates:   Session 1: Pt received *** for skilled OT session with focus on ***. Pt agreeable to interventions, demonstrating overall *** mood. Pt reported ***/10 pain, stating "***" in reference to ***. OT offering intermediate rest breaks and positioning suggestions throughout session to address pain/fatigue and maximize participation/safety in session.    Pt remained *** with all immediate needs met at end of session. Pt continues to be appropriate for skilled OT intervention to promote further functional independence.   Session 2: Pt received *** for skilled OT session with focus on ***. Pt agreeable to interventions, demonstrating overall *** mood. Pt reported ***/10 pain, stating "***" in reference to ***. OT offering intermediate rest breaks and positioning suggestions throughout session to address pain/fatigue and maximize participation/safety in session.    Pt remained *** with all immediate needs met at end of session. Pt continues to be appropriate for skilled OT intervention to promote further functional independence.    Therapy Documentation Precautions:  Precautions Precautions: Fall Precaution Comments: R BKA Required Braces or Orthoses: Other Brace Other Brace: Right limb guard Restrictions Weight Bearing Restrictions: Yes RLE Weight Bearing: Non weight bearing Other Position/Activity Restrictions: Limb gaurd   Therapy/Group: Individual Therapy  Lou Cal, OTR/L, MSOT  10/24/2022, 10:53 PM

## 2022-10-24 NOTE — Progress Notes (Signed)
PROGRESS NOTE   Subjective/Complaints:   Pt reports had bacon yesterday which made gastroparesis worse- would like diet liberalized so can have more choices.   Just ordered breakfast.  ROS:   Pt denies: SOB, (+) abd pain, CP, N/V/C/D, and vision changes  Except for HPI    Objective:   No results found. No results for input(s): "WBC", "HGB", "HCT", "PLT" in the last 72 hours.  No results for input(s): "NA", "K", "CL", "CO2", "GLUCOSE", "BUN", "CREATININE", "CALCIUM" in the last 72 hours.   Intake/Output Summary (Last 24 hours) at 10/24/2022 0854 Last data filed at 10/24/2022 0738 Gross per 24 hour  Intake 717 ml  Output 2450 ml  Net -1733 ml        Physical Exam: Vital Signs Blood pressure (!) 153/93, pulse 98, temperature 98.4 F (36.9 C), temperature source Oral, resp. rate 18, height 5\' 9"  (1.753 m), weight 102 kg, SpO2 99 %.        General: awake, alert, appropriate, sitting up at EOB;  NAD HENT: conjugate gaze; oropharynx moist CV: regular to borderline tachycardic rate; no JVD Pulmonary: CTA B/L; no W/R/R- good air movement GI: soft, NT, ND, (+)BS- c/o abd pain Psychiatric: appropriate- pleasant Neurological: Ox3- able to make decisions  Neuro:  Alert and oriented, follows commands, CN 2-12 grossly intact, no speech or language deficits noted  Strength 5/5 in b/l UE and LLE Strength 5/5 hip flexion RLE, able to extend R knee to gravity Sensation intact to LT in b/l UE and LLE , intact to LT L residual limb No ataxia or dysmetria noted Musculoskeletal: No joint swelling or tenderness noted  Skin- R BKA wrapped with ACE wrap and kerlex- C/D/I-  R forearm has healing blisters- ~ 60-70% healed   Assessment/Plan: 1. Functional deficits which require 3+ hours per day of interdisciplinary therapy in a comprehensive inpatient rehab setting. Physiatrist is providing close team supervision and 24 hour  management of active medical problems listed below. Physiatrist and rehab team continue to assess barriers to discharge/monitor patient progress toward functional and medical goals  Care Tool:  Bathing    Body parts bathed by patient: Right arm, Left arm, Chest, Abdomen, Front perineal area, Buttocks, Right upper leg, Left upper leg, Left lower leg, Face     Body parts n/a: Right lower leg   Bathing assist Assist Level: Moderate Assistance - Patient 50 - 74%     Upper Body Dressing/Undressing Upper body dressing   What is the patient wearing?: Hospital gown only    Upper body assist Assist Level: Minimal Assistance - Patient > 75%    Lower Body Dressing/Undressing Lower body dressing      What is the patient wearing?: Ace wrap/stump shrinker, Hospital gown only     Lower body assist Assist for lower body dressing: Maximal Assistance - Patient 25 - 49%     Toileting Toileting    Toileting assist Assist for toileting: Minimal Assistance - Patient > 75%     Transfers Chair/bed transfer  Transfers assist     Chair/bed transfer assist level: Contact Guard/Touching assist     Locomotion Ambulation   Ambulation assist  Assist level: Contact Guard/Touching assist Assistive device: Walker-rolling Max distance: 25 ft   Walk 10 feet activity   Assist     Assist level: Contact Guard/Touching assist Assistive device: Walker-rolling   Walk 50 feet activity   Assist Walk 50 feet with 2 turns activity did not occur: Safety/medical concerns (fatigue)         Walk 150 feet activity   Assist Walk 150 feet activity did not occur: Safety/medical concerns (fatigue)         Walk 10 feet on uneven surface  activity   Assist     Assist level: Moderate Assistance - Patient - 50 - 74% Assistive device: Photographer Is the patient using a wheelchair?: Yes Type of Wheelchair: Manual    Wheelchair assist level:  Supervision/Verbal cueing Max wheelchair distance: 200 ft    Wheelchair 50 feet with 2 turns activity    Assist        Assist Level: Supervision/Verbal cueing   Wheelchair 150 feet activity     Assist      Assist Level: Supervision/Verbal cueing   Blood pressure (!) 153/93, pulse 98, temperature 98.4 F (36.9 C), temperature source Oral, resp. rate 18, height 5\' 9"  (1.753 m), weight 102 kg, SpO2 99 %.   Medical Problem List and Plan: 1. 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  Antithrombotics: -DVT/anticoagulation:  Pharmaceutical: Lovenox             -antiplatelet therapy: N/A 3. Pain Management: Tylenol prn. 5/16- pt refusing meds- says has no pain/phantom pain  4. Mood/Behavior/Sleep: LCSW to follow for evaluation and support.              -antipsychotic agents: N/A 5. Neuropsych/cognition: This patient IS capable of making decisions on his own behalf. 6. Skin/Wound Care: Routine pressure relief measures.   5/16- will order Shrinkers x2 to wear and to wash for R BKA  5/17- got shrinkers 7. Fluids/Electrolytes/Nutrition: Monitor I/O. Refusing all meds as it makes him sick             --briefly dicussed need for meds but he declines due to severe reflux/nausea             --will d/c vitamins and colace to simplify regimen 8. Strep oralis/mitis Bacteremia: Has been refusing IV antibiotics and any meds since yesterday pm.    5/16- continues to refuse ABX of any kind- knows he's at risk of becoming septic and mortality if becomes septic- WBC is 6.1- ID has already spoken with him  5/18- labs q Monday to f/u on CBC 9. Severe GERD: Has refusing PPI--but after discussion was willing to try IV for relief.   5/17- refusing any  PPI  5/18- still refusing 10. T2DM: Hgb A1C- 10.4 , previously had blood sugars over 500 although now appear improved, he continues to refuse insulin.    5/16- CBG's running 115 this AM to 180- educated pt to try and keep CBGs below 150 for improved wound healing- also probably the reason he's voiding so much  5/17- CBGs 115-178- refusing all  SSI, etc- says he usually eats just breakfast/dinner, but ate lunch yesterday. 5.18- refusing all insulin-  CBGs 111-178- con't to encourage pt to take meds 11. Hyponatremia: 10/20/2022 NA 134 recheck in am  5/16- pending  5/17- Na 133-  12. Anemia of chronic illness?:  10/20/2022 his Hgb was 8.4 recheck in am.   5/17- Hb up to 9.5 13.  Diabetic gastroparesis.  Refused Reglan  5/18- having a lot of gastroparesis- however doesn't want laxatives or Reglan.   5/19- will change diet to regular diet to see if can help him with better/choices for gastroparesis/abd irritation.  14.  Hypomagnesemia.  Recheck tomorrow   5/16- Refusing all PO meds including Mg- last Mg 1.6- pending this AM  5/17- Mg 1.6- refusing any intervention  5/18- pt still refusing meds 15. L ear pain  5/16- assessed ear myself- no sore- will order Debrox 5 drops L ear BID x 4 days to clean out wax.   Pt still refusing all meds- and sees no reason to replete Mg, do meds for gastroparesis/GERD.     LOS: 4 days A FACE TO FACE EVALUATION WAS PERFORMED  Serenna Deroy 10/24/2022, 8:54 AM

## 2022-10-24 NOTE — Progress Notes (Signed)
Occupational Therapy Session Note  Patient Details  Name: Carlos [name] MRN: [id number] Date of Birth: [birth year]  Today's Date: 10/24/2022 OT Individual Time: 7829-5621 OT Individual Time Calculation (min): 29 min   Today's Date: 10/24/2022 OT Individual Time: 1300-1425 OT Individual Time Calculation (min): 85 min   Short Term Goals: Week 1:  OT Short Term Goal 1 (Week 1): STG=LTG's based on LOS  Skilled Therapeutic Interventions/Progress Updates:   Session 1: Pt received resting in bed for skilled OT session with focus on IADLs. Pt agreeable to interventions, despite demonstrating overall flat mood/affect. Pt with no reports of pain. OT offering intermediate rest breaks and positioning suggestions throughout session to address potential pain/fatigue and maximize participation/safety in session.   Pt comes to EOB, performing EOB>WC squat-pivot all with supervision. Pt propels WC to ADL apartment with supervision. In ADL apartment, pt simulates simple meal prep, education provided on reversing technique to access refrigerator and safe transportation of food all at [facility] level. Pt then performs series of functional transfers onto love seat and flat bed. Pt requires no more than CGA for all transfers, requiring cuing/education of safe WC management to decrease potential for falls. Pt appears receptive of education.   Pt remained in care of PT due to direct handoff. Pt continues to be appropriate for skilled OT intervention to promote further functional independence.   Session 2: Pt received sitting EOB for skilled OT session with focus on general conditioning and BADL participation. Pt agreeable to interventions, despite demonstrating overall flat mood/affect. Pt with no reports of pain. OT offering intermediate rest breaks and positioning suggestions throughout session to address potential pain/fatigue and maximize participation/safety in session.   Pt performs all squat pivot functional  transfers with close supervision. Pt propels to all therapy locations with supervision. In ortho gym, pt participates in 5 mins of SciFit at 1.5 resistance.   In main therapy gym, pt participates in the following UB strengthening exercises: -Bicep curls -Punches -Lateral raises  Pt performs 1 set/10 reps of each exercise with cuing for form.   Back in patient's room, pt completes full shower with distant supervision provided after residual limb/IV covered. Pt prefers gowns, donning with item retrieval.   Pt remained resting in bed with all immediate needs met at end of session. Pt continues to be appropriate for skilled OT intervention to promote further functional independence.   Therapy Documentation Precautions:  Precautions Precautions: Fall Precaution Comments: R BKA Required Braces or Orthoses: Other Brace Other Brace: Right limb guard Restrictions Weight Bearing Restrictions: Yes RLE Weight Bearing: Weight bearing as tolerated Other Position/Activity Restrictions: Limb gaurd   Therapy/Group: Individual Therapy  Lou Cal, OTR/L, MSOT  10/24/2022, 5:26 AM

## 2022-10-24 NOTE — Progress Notes (Signed)
Physical Therapy Session Note  Patient Details  Name: Carlos [name] MRN: [id number] Date of Birth: May 06, 1976  Today's Date: 10/24/2022 PT Individual Time: 0915-1015 PT Individual Time Calculation (min): 60 min   Short Term Goals: Week 1:  PT Short Term Goal 1 (Week 1): STG=LTG's due to ELOS  Skilled Therapeutic Interventions/Progress Updates:      Therapy Documentation Precautions:  Precautions Precautions: Fall Precaution Comments: R BKA Required Braces or Orthoses: Other Brace Other Brace: Right limb guard Restrictions Weight Bearing Restrictions: Yes RLE Weight Bearing: Weight bearing as tolerated Other Position/Activity Restrictions: Limb gaurd  Treatment Session 1:   Pt received semi-reclined in bed, agreeable to PT session. Pt (S) for bed mobility and stand pivot transfer to w/c no AD. Pt dependent for left ted hose donning for edema management.  Pt transported dependently for energy conservation to main gym and declines pain in session.   Pt performed 1 x 10 mini left single leg squats with unilateral support in parallel bars to address strength deficits. Pt participated in discussion regarding discharge planning. Pt reports he has to navigate a curb to access condo and does not have a cut out or ramp. PT performed wheelchair curb step with patient and pt reports he believes his daughter will be able to perform. PT scheduled family education for Wed May 22 nd from 1-3 with his daughter for discharge prep.   Pt discussed difficulty sleeping at night due to hospital setting. MD cleared sign to minimize interruptions for patient during night time.   Pt returned to room by w/c and left in bed with all needs in reach and declines alarm.   Treatment Session 2:   Pt received in ADL apartment with OT and PT present for handoff. Pt (S) for squat pivot to w/c and transported dependently for time management to main gym.   Pt without reports of pain in session and does state he has  had phantom limb pain. PT educated pt on sensory desensitization techniques and mirror therapy.   Pt performed following LE exercises to address strength deficits and improve activity tolerance:   -left single leg mini squats 2 x 10   -standing right LE hip abduction 3 x 10   -standing left LE heel raises 3 x 8   Pt transported to room by w/c dependently for energy conservation and left seated at bedside with all needs in reach, pt declines alarm.   Therapy/Group: Individual Therapy  Truitt Leep Truitt Leep PT, DPT  10/24/2022, 7:31 AM

## 2022-10-24 NOTE — Progress Notes (Signed)
Patient refused all medication this shift. Writer educated patient on importance of medication management. Patient alert and oriented and resting comfortably.

## 2022-10-25 DIAGNOSIS — R7881 Bacteremia: Secondary | ICD-10-CM

## 2022-10-25 DIAGNOSIS — F54 Psychological and behavioral factors associated with disorders or diseases classified elsewhere: Secondary | ICD-10-CM

## 2022-10-25 LAB — GLUCOSE, CAPILLARY
Glucose-Capillary: 160 mg/dL — ABNORMAL HIGH (ref 70–99)
Glucose-Capillary: 147 mg/dL — ABNORMAL HIGH (ref 70–99)
Glucose-Capillary: 249 mg/dL — ABNORMAL HIGH (ref 70–99)
Glucose-Capillary: 255 mg/dL — ABNORMAL HIGH (ref 70–99)

## 2022-10-25 LAB — BASIC METABOLIC PANEL
Anion gap: 10 (ref 5–15)
BUN: 12 mg/dL (ref 6–20)
CO2: 26 mmol/L (ref 22–32)
Calcium: 8.5 mg/dL — ABNORMAL LOW (ref 8.9–10.3)
Chloride: 99 mmol/L (ref 98–111)
Creatinine, Ser: 0.9 mg/dL (ref 0.61–1.24)
GFR, Estimated: >60 mL/min (ref >60)
Glucose, Bld: 171 mg/dL — ABNORMAL HIGH (ref 70–99)
Potassium: 4.3 mmol/L (ref 3.5–5.1)
Sodium: 135 mmol/L (ref 135–145)

## 2022-10-25 LAB — CBC WITH DIFFERENTIAL/PLATELET
Abs Immature Granulocytes: 0.01 10*3/uL (ref 0.00–0.07)
Basophils Absolute: 0 10*3/uL (ref 0.0–0.1)
Basophils Relative: 1 %
Eosinophils Absolute: 0 10*3/uL (ref 0.0–0.5)
Eosinophils Relative: 1 %
HCT: 32.9 % — ABNORMAL LOW (ref 39.0–52.0)
Hemoglobin: 10.3 g/dL — ABNORMAL LOW (ref 13.0–17.0)
Immature Granulocytes: 0 %
Lymphocytes Relative: 32 %
Lymphs Abs: 1.3 10*3/uL (ref 0.7–4.0)
MCH: 29.1 pg (ref 26.0–34.0)
MCHC: 31.3 g/dL (ref 30.0–36.0)
MCV: 92.9 fL (ref 80.0–100.0)
Monocytes Absolute: 0.6 10*3/uL (ref 0.1–1.0)
Monocytes Relative: 15 %
Neutro Abs: 2.1 10*3/uL (ref 1.7–7.7)
Neutrophils Relative %: 51 %
Platelets: 214 10*3/uL (ref 150–400)
RBC: 3.54 MIL/uL — ABNORMAL LOW (ref 4.22–5.81)
RDW: 15.4 % (ref 11.5–15.5)
WBC: 4.2 10*3/uL (ref 4.0–10.5)
nRBC: 0 % (ref 0.0–0.2)

## 2022-10-25 NOTE — Progress Notes (Signed)
Physical Therapy Discharge Summary  Patient Details  Name: Carlos [name] MRN: [id number] Date of Birth: 1976-05-14  Date of Discharge from PT service:{Time; dates multiple:304500300}  {CHL IP REHAB PT TIME CALCULATION:304800500}   Patient has met {NUMBERS 0-12:18577} of {NUMBERS 0-12:18577} long term goals due to {due HQ:4696295}.  Patient to discharge at a wheelchair level Modified Independent.   Patient's care partner is independent to provide the necessary physical assistance at discharge an available intermittently throughout the day.  Reasons goals not met: n/a  Recommendation:  Patient will benefit from ongoing skilled PT services in home health setting to continue to advance safe functional mobility, address ongoing impairments in balance, activity tolerance, ambulation endurance, and minimize fall risk.  Equipment: Manual WC; pt receiving RW from friend  Reasons for discharge: {Reason for discharge:3049018}  Patient/family agrees with progress made and goals achieved: {Pt/Family agree with progress/goals:3049020}  PT Discharge Precautions/Restrictions Precautions Precautions: Fall Required Braces or Orthoses: Other Brace Other Brace: Right limb guard Restrictions RLE Weight Bearing: Non weight bearing Vital Signs  Pain Pain Assessment Pain Scale: 0-10 Pain Score: 0-No pain Pain Interference Pain Interference Pain Effect on Sleep: 0. Does not apply - I have not had any pain or hurting in the past 5 days Pain Interference with Therapy Activities: 0. Does not apply - I have not received rehabilitationtherapy in the past 5 days Pain Interference with Day-to-Day Activities: 1. Rarely or not at all Vision/Perception     Cognition Overall Cognitive Status: Within Functional Limits for tasks assessed Arousal/Alertness: Awake/alert Orientation Level: Oriented X4 Sensation Sensation Light Touch: Impaired by gross assessment Additional Comments: LLE  neuropathy Coordination Gross Motor Movements are Fluid and Coordinated: Yes Fine Motor Movements are Fluid and Coordinated: Yes Motor  Motor Motor: Within Functional Limits  Mobility Bed Mobility Bed Mobility: Rolling Right;Rolling Left;Supine to Sit;Sit to Supine Rolling Right: Independent Rolling Left: Independent Supine to Sit: Independent Sit to Supine: Independent Transfers Transfers: Sit to Stand;Stand to Sit;Stand Pivot Transfers Sit to Stand: Independent with assistive device Stand to Sit: Independent with assistive device Stand Pivot Transfers: Independent with assistive device Transfer (Assistive device): Rolling walker Locomotion  Gait Ambulation: Yes Gait Assistance: Independent with assistive device Gait Distance (Feet): 100 Feet Assistive device: Rolling walker Stairs / Additional Locomotion Stairs: No Ramp: Independent with assistive device Curb: Dependent - Patient 0% Naval architect Mobility: Yes Wheelchair Assistance: Independent with Scientist, research (life sciences): Both upper extremities Wheelchair Parts Management: Independent Distance: 300  Trunk/Postural Assessment  Cervical Assessment Cervical Assessment: Within Functional Limits Thoracic Assessment Thoracic Assessment: Within Functional Limits Lumbar Assessment Lumbar Assessment: Within Functional Limits  Balance Balance Balance Assessed: Yes Static Sitting Balance Static Sitting - Balance Support: Feet supported;No upper extremity supported Static Sitting - Level of Assistance: 7: Independent Dynamic Sitting Balance Dynamic Sitting - Balance Support: Feet supported Dynamic Sitting - Level of Assistance: 7: Independent Dynamic Sitting - Balance Activities: Lateral lean/weight shifting;Reaching for objects Static Standing Balance Static Standing - Balance Support: Bilateral upper extremity supported Static Standing - Level of Assistance: 6: Modified independent  (Device/Increase time) Dynamic Standing Balance Dynamic Standing - Balance Support: Bilateral upper extremity supported;During functional activity Dynamic Standing - Level of Assistance: 6: Modified independent (Device/Increase time) Dynamic Standing - Balance Activities: Reaching for objects Extremity Assessment      RLE Assessment RLE Assessment: Exceptions to [facility] General Strength Comments: gross 4-/5 LLE Assessment LLE Assessment: Within Functional Limits   Dionne Milo 10/25/2022, 10:55 AM

## 2022-10-25 NOTE — Progress Notes (Addendum)
PROGRESS NOTE   Subjective/Complaints:   Working in gym with therapy. No new concerns this AM. Continues to decline medications.   Just ordered breakfast.  ROS:   Pt denies: SOB,fever, chills, abd pain, CP, N/V/C/D, and vision changes  Except for HPI    Objective:   No results found. Recent Labs    10/25/22 0517  WBC 4.2  HGB 10.3*  HCT 32.9*  PLT 214    Recent Labs    10/25/22 0517  NA 135  K 4.3  CL 99  CO2 26  GLUCOSE 171*  BUN 12  CREATININE 0.90  CALCIUM 8.5*     Intake/Output Summary (Last 24 hours) at 10/25/2022 1717 Last data filed at 10/25/2022 0700 Gross per 24 hour  Intake 330 ml  Output 600 ml  Net -270 ml         Physical Exam: Vital Signs Blood pressure 125/72, pulse 96, temperature 98.2 F (36.8 C), temperature source Oral, resp. rate 19, height 5\' 9"  (1.753 m), weight 102 kg, SpO2 100 %.        General: awake, alert, appropriate, working in gym HENT: conjugate gaze; oropharynx moist CV: regular to borderline tachycardic rate; no JVD Pulmonary: CTA B/L; no W/R/R- good air movement GI: soft, NT, ND, (+)BS-normoactive Psychiatric: appropriate- pleasant Neurological: Ox3- able to make decisions  Neuro:  Alert and oriented, follows commands, CN 2-12 grossly intact, no speech or language deficits noted  Strength 5/5 in b/l UE and LLE Strength 5/5 hip flexion RLE, able to extend R knee to gravity Sensation intact to LT in b/l UE and LLE , intact to LT L residual limb No ataxia or dysmetria noted Musculoskeletal: No joint swelling or tenderness noted  Skin- R BKA wrapped with ACE wrap and kerlex- C/D/I-  R forearm has healing blisters- ~ 60-70% healed   Assessment/Plan: 1. Functional deficits which require 3+ hours per day of interdisciplinary therapy in a comprehensive inpatient rehab setting. Physiatrist is providing close team supervision and 24 hour management of  active medical problems listed below. Physiatrist and rehab team continue to assess barriers to discharge/monitor patient progress toward functional and medical goals  Care Tool:  Bathing    Body parts bathed by patient: Right arm, Left arm, Chest, Abdomen, Front perineal area, Buttocks, Right upper leg, Left upper leg, Left lower leg, Face     Body parts n/a: Right lower leg   Bathing assist Assist Level: Moderate Assistance - Patient 50 - 74%     Upper Body Dressing/Undressing Upper body dressing   What is the patient wearing?: Hospital gown only    Upper body assist Assist Level: Minimal Assistance - Patient > 75%    Lower Body Dressing/Undressing Lower body dressing      What is the patient wearing?: Ace wrap/stump shrinker, Hospital gown only     Lower body assist Assist for lower body dressing: Maximal Assistance - Patient 25 - 49%     Toileting Toileting    Toileting assist Assist for toileting: Independent     Transfers Chair/bed transfer  Transfers assist     Chair/bed transfer assist level: Independent with assistive device Chair/bed transfer assistive device:  Walker   Locomotion Ambulation   Ambulation assist      Assist level: Independent with assistive device Assistive device: Walker-rolling Max distance: 121ft   Walk 10 feet activity   Assist     Assist level: Independent with assistive device Assistive device: Walker-rolling   Walk 50 feet activity   Assist Walk 50 feet with 2 turns activity did not occur: Safety/medical concerns (fatigue)  Assist level: Independent with assistive device Assistive device: Walker-rolling    Walk 150 feet activity   Assist Walk 150 feet activity did not occur: Safety/medical concerns (endurance deficit)         Walk 10 feet on uneven surface  activity   Assist     Assist level: Independent with assistive device Assistive device: Walker-rolling   Wheelchair     Assist Is  the patient using a wheelchair?: Yes Type of Wheelchair: Manual    Wheelchair assist level: Independent Max wheelchair distance: 367ft    Wheelchair 50 feet with 2 turns activity    Assist        Assist Level: Independent   Wheelchair 150 feet activity     Assist      Assist Level: Independent   Blood pressure 125/72, pulse 96, temperature 98.2 F (36.8 C), temperature source Oral, resp. rate 19, height 5\' 9"  (1.753 m), weight 102 kg, SpO2 100 %.   Medical Problem List and Plan: 1. 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  Antithrombotics: -DVT/anticoagulation:  Pharmaceutical: Lovenox             -antiplatelet therapy: N/A 3. Pain Management: Tylenol prn. 5/16- pt refusing meds- says has no pain/phantom pain  4. Mood/Behavior/Sleep: LCSW to follow for evaluation and support.              -antipsychotic agents: N/A 5. Neuropsych/cognition: This patient IS capable of making decisions on his own behalf.  -Seen by neuropsychology- appreciate 6. Skin/Wound Care: Routine pressure relief measures.   5/16- will order Shrinkers x2 to wear and to wash for R BKA  5/17- got shrinkers 7. Fluids/Electrolytes/Nutrition: Monitor I/O. Refusing all meds as it makes him sick             --briefly dicussed need for meds but he declines due to severe reflux/nausea             --will d/c vitamins and colace to simplify regimen 8. Strep oralis/mitis Bacteremia: Has been refusing IV antibiotics and any meds since yesterday pm.    5/16- continues to refuse ABX of any kind- knows he's at risk of becoming septic and mortality if becomes septic- WBC is 6.1- ID has already spoken with him  5/18- labs q Monday to f/u on  CBC  5/20 WBC stable at 4.2, stable 9. Severe GERD: Has refusing PPI--but after discussion was willing to try IV for relief.   5/17- refusing any PPI  5/18- still refusing 10. T2DM: Hgb A1C- 10.4 , previously had blood sugars over 500 although now appear improved, he continues to refuse insulin.    5/16- CBG's running 115 this AM to  180- educated pt to try and keep CBGs below 150 for improved wound healing- also probably the reason he's voiding so much  5/17- CBGs 115-178- refusing all SSI, etc- says he usually eats just breakfast/dinner, but ate lunch yesterday. 5.18- refusing all insulin-  CBGs 111-178- con't to encourage pt to take meds 5/20 CBG elevated at times, he refuses medications 11. Hyponatremia: 10/20/2022 NA 134 recheck in am  5/16- pending  5/17- Na 133-   5/20 Na stable 135 12. Anemia of chronic illness?:  10/20/2022 his Hgb was 8.4 recheck in am.   5/17- Hb up to 9.5  5/20 HGB up to 10.3, a little improved 13.  Diabetic gastroparesis.  Refused Reglan  5/18- having a lot of gastroparesis- however doesn't want laxatives or Reglan.   5/19- will change diet to regular diet to see if can help him with better/choices for gastroparesis/abd irritation.   5/20 LBM today 14.  Hypomagnesemia.  Recheck tomorrow   5/16- Refusing all PO meds including Mg- last Mg 1.6- pending this AM  5/17- Mg 1.6- refusing any intervention  5/18- pt still refusing meds 15. L ear pain  5/16- assessed ear myself- no sore- will order Debrox 5 drops L ear BID x 4 days to clean out wax.   Pt still refusing all meds- and sees no reason to replete Mg, do meds for gastroparesis/GERD.     LOS: 5 days A FACE TO FACE EVALUATION WAS PERFORMED  Fanny Dance 10/25/2022, 5:17 PM

## 2022-10-25 NOTE — Progress Notes (Signed)
Education provided to patient re: Diabetes. Patient is very aware claims he had DM x 20 years now and knows how to manage. Provided education re stump care.  Daughter in room with no further question.

## 2022-10-25 NOTE — Progress Notes (Signed)
Patient refused all his scheduled medication. Patient verbalized understanding aboput his medication regimen. Care ongoing.

## 2022-10-25 NOTE — Consult Note (Signed)
Neuropsychological Consultation Comprehensive Inpatient Rehab   Patient:   Carlos [name]   DOB:   23-Dec-1975  MR Number:  [id number]  Location:  [facility] [address] CENTER [id number] [place] [address] Dept: [phone] Loc: [phone]           Date of Service:   10/25/2022  Start Time:   9 AM End Time:   10 AM  Provider/Observer:  Arley Phenix, Psy.D.       Clinical Neuropsychologist       Billing Code/Service: [phone]  Reason for Service:    [name] is a 47 year old male.  Patient with recent BKA and still refusing to take antibiotics.  Saw the patient again today and he continues with his refusal.  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  Concerned about kidney function etc.  Patient reports good mood and discharge soon.  Reviewed issues patient would need close attention post discharge.  Tried to stress the reason for recommended treatment post BKA but patient still refused.  Said appetite has improved and ate breakfast and is looking to lunch.    HPI for the current admission:      Medical History:   Past Medical History:  Diagnosis Date   Diabetes mellitus          Patient Active Problem List   Diagnosis Date Noted   Coping style affecting medical condition 10/25/2022   Adjustment disorder 10/22/2022   Complete below knee amputation of lower extremity, right, sequela (HCC) 10/20/2022   Hypomagnesemia 10/20/2022   Anemia of chronic disease 10/20/2022   Gastroesophageal reflux disease 10/20/2022   Necrotizing fasciitis of lower leg (HCC) 10/10/2022   Gangrene of right foot (HCC) 10/09/2022   Cellulitis of right lower extremity 10/09/2022   Diabetic foot ulcer (HCC) 10/08/2022   Diabetes (HCC) 10/08/2022   Severe sepsis (HCC) 10/08/2022   Hyponatremia 10/08/2022   Lactic acidosis 10/08/2022   AKI (acute kidney injury) (HCC) 10/08/2022   Diabetic gastroparesis (HCC)  10/08/2022   Elevated troponin 10/08/2022   H/O: CVA (cerebrovascular accident) 10/08/2022    Behavioral Observation/Mental Status:   [name]  presents as a 47 y.o.-year-old Right handed African American Male who appeared his stated age. his dress was Appropriate and he was Well Groomed and his manners were Appropriate to the situation.  his participation was indicative of Appropriate and Attentive behaviors.  There were physical disabilities noted.  he displayed an appropriate level of cooperation and motivation.    Interactions:    Active Appropriate  Attention:   within normal limits and attention span and concentration were age appropriate  Memory:   within normal limits; recent and remote memory intact  Visuo-spatial:   not examined  Speech (Volume):  normal  Speech:   normal; normal  Thought Process:  Coherent and Relevant  Coherent and Linear  Though Content:  WNL; not suicidal and not homicidal  Orientation:   person, place, time/date, and situation  Judgment:   Fair  Planning:   Fair  Affect:    Appropriate  Mood:    Dysphoric  Insight:   Fair  Intelligence:   normal    Impression/DX:   [name] is a 47 year old male.  Patient with recent BKA and still refusing to take antibiotics.  Saw the patient again today and he continues with his refusal.  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  Concerned about kidney function etc.  Patient reports good mood and discharge soon.  Reviewed issues patient would need close attention post discharge.  Tried to stress the reason for recommended treatment post BKA but patient still refused.  Said appetite has improved and ate breakfast and is looking to lunch.     Diagnosis:    Coping style impacting medical Condition         Electronically Signed   _______________________ Arley Phenix, Psy.D. Clinical Neuropsychologist

## 2022-10-25 NOTE — Progress Notes (Signed)
Physical Therapy Session Note  Patient Details  Name: Carlos [name] MRN: [id number] Date of Birth: 1976/04/29  Today's Date: 10/25/2022 PT Individual Time: 1015-1110 + 1445 - 1525 PT Individual Time Calculation (min): 55 min + 40 min  Short Term Goals: Week 1:  PT Short Term Goal 1 (Week 1): STG=LTG's due to ELOS  Skilled Therapeutic Interventions/Progress Updates:     Session 1: Chart reviewed and pt agreeable to therapy. Pt received seated EOB with no c/o pain. Session focused on preparation for d/c and safe home access. Pt initiated session with transfer to [facility] using ModI. Pt then completed 357ft WC propulsion to therapy gym with ModI. Pt then completed assessment of pain, functional mobility, transfers and locomotion as noted in d/c note. Of note, pt completed both WC navigation of ramp and amb of ramp (2ft) with modI and use of Rw for amb. Pt also reviewed WC parts and management with PT, displaying ability to manage arm rests, leg rests, and breaks. Session education emphasized role of next level of PT care. Pt then completed return to bed with same mobility level as start of session. At end of session, pt was left seated EOB with alarm engaged, nurse call bell and all needs in reach.  Session 2: Chart reviewed and pt agreeable to therapy. Pt received seated in WC with no c/o pain. Session focused on family education with daughter and amb practice to promote safe home access. Pt initiated session with WC propulsion of 157ft to therapy gym using ModI. With support of 2nd PT in gym, PT then demonstrated safe curb bump with WC to daughter. Daughter then demonstrated safe bump of WC on/off curb x3. PT emphasized safety and appropriate WC direction to avoid pt fall out of chair. Pt and daughter confirmed feeling comfortable with PT transfer. Pt then completed car transfer x2 with modI and daughter verbalizing no questions after viewing. Pt then amb 61ft using supervision + RW before expressing  fatigue. PT, pt, and family then reviewed all WC parts and how to store [facility], with daughter confirming and demonstrating understanding. Pt then completed modI WC propulsion back to room. At end of session, pt was left sitting EOB with alarm engaged, nurse call bell and all needs in reach.    Therapy Documentation Precautions:  Precautions Precautions: Fall Precaution Comments: R BKA Required Braces or Orthoses: Other Brace Other Brace: Right limb guard Restrictions Weight Bearing Restrictions: Yes RLE Weight Bearing: Non weight bearing Other Position/Activity Restrictions: Limb gaurd    Therapy/Group: Individual Therapy  Dorise Bullion, DPT 10/25/2022, 12:35 PM

## 2022-10-25 NOTE — Progress Notes (Signed)
Patient refused all his morning medications except cbg checks.Per report MD aware and care coordinator is aware.

## 2022-10-25 NOTE — Progress Notes (Addendum)
Patient ID: Carlos [name], male   DOB: 01/05/1976, 47 y.o.   MRN: [id number]  Have scheduled PCP follow up with Gwinda Passe NP at the [facility] for 5/29 @ 3;45 pm. Placed in AVS.  10;30 Am Placed pt in Match program have let PA know this. So will have assistance with his medications for DC

## 2022-10-26 ENCOUNTER — Other Ambulatory Visit (HOSPITAL_COMMUNITY): Payer: Self-pay

## 2022-10-26 LAB — GLUCOSE, CAPILLARY
Glucose-Capillary: 178 mg/dL — ABNORMAL HIGH (ref 70–99)
Glucose-Capillary: 140 mg/dL — ABNORMAL HIGH (ref 70–99)
Glucose-Capillary: 240 mg/dL — ABNORMAL HIGH (ref 70–99)
Glucose-Capillary: 241 mg/dL — ABNORMAL HIGH (ref 70–99)

## 2022-10-26 MED ORDER — ASCORBIC ACID 1000 MG PO TABS
1000.0000 mg | ORAL_TABLET | Freq: Every day | ORAL | 0 refills | Status: DC
  Filled 2022-10-26: qty 100, 100d supply, fill #0

## 2022-10-26 MED ORDER — MAGNESIUM OXIDE 400 MG PO TABS
400.0000 mg | ORAL_TABLET | Freq: Two times a day (BID) | ORAL | 0 refills | Status: DC
  Filled 2022-10-26: qty 120, 60d supply, fill #0

## 2022-10-26 NOTE — Progress Notes (Signed)
Patient refused medications only agreed for his lovenox shot at 1700. MD  aware.

## 2022-10-26 NOTE — Progress Notes (Signed)
Patient ID: Carlos [name], male   DOB: 1975-06-17, 47 y.o.   MRN: [id number]  Met with pt who would like to begin SSD application unsure if his sister has applied for him, thinks it might have been medicaid. [facility] application completed and emailed to them to begin the process. Have given pt the paperwork back for his records.

## 2022-10-26 NOTE — Progress Notes (Signed)
Inpatient Rehabilitation Care Coordinator Discharge Note   Patient Details  Name: Carlos [name] MRN: [id number] Date of Birth: 06-15-1975   Discharge location: HOME WITH 49 YO DAUGHTER WHO ATTENDS SCHOOL AND WORKS  Length of Stay: 7 DAYS  Discharge activity level: MOD/I LEVEL  Home/community participation: ACTIVE  Patient response WU:JWJXBJ Literacy - How often do you need to have someone help you when you read instructions, pamphlets, or other written material from your doctor or pharmacy?: Never  Patient response YN:WGNFAO Isolation - How often do you feel lonely or isolated from those around you?: Never  Services provided included: MD, RD, PT, OT, RN, CM, TR, Pharmacy, Neuropsych, SW  Financial Services:  Field seismologist Utilized: Other (Comment) (APPLYING FOR MEDICAID)    Choices offered to/list presented to: PT  Follow-up services arranged:  DME, Patient/Family has no preference for HH/DME agencies      DME : [facility]  WHEELCHAIR AND TUB BENCH WILL GET ROLLING WALKER ON OWN [facility] DID NOT ACCEPT PT'S REFERRAL. MAY NEED TO WAIT FOR MEDICAID TO GET OP OR HH FOLLOW UP. [facility] REFERRAL MADE FOR ASSISTANCE WITH SSD/SSI APPLICATION. APPLIED FOR MEDICAID WHILE HERE. MATCH PLACED FOR ASSISTANCE WITH MEDICATIONS FOR 30 DAYS. PCP APPOINTMENT MADE AT [facility] 5/29 @ 3:45 PM. HAS APPLIED FOR FOOD STAMPS WHILE HERE-ON-LINE. OFFERED PEER SUPPORT AND FOLLOW UP COUNSELING PT DECLINED BOTH. HANDICAPPED CAR APPLICATION GIVEN TO PT IN PAPERWORK PACKET.    Patient response to transportation need: Is the patient able to respond to transportation needs?: Yes In the past 12 months, has lack of transportation kept you from medical appointments or from getting medications?: No In the past 12 months, has lack of transportation kept you from meetings, work, or from getting things needed for daily living?: No   Patient/Family verbalized understanding of follow-up  arrangements:  Yes  Individual responsible for coordination of the follow-up plan: SELF [phone]  Confirmed correct DME delivered: Lucy Chris 10/26/2022    Comments (or additional information): PT HAS DONE WELL HERE AND FEELS READY TO GO HOME. HAS CONTINUED TO REFUSE MEDICATIONS AND AWARE OF THE CONSEQUENCES. WILL BE AT HIGH RISK FOR RE-ADMISSION DUE TO THIS. APPLYING FOR MEDICAID AND REFERRAL SENT TO [facility] FOR SSD/SSI. DAUGHTER WAS HERE FOR EDUCATION AND IT WENT WELL.  Summary of Stay    Date/Time Discharge Planning CSW  10/26/22 1005 Home with 49 yo daughter who works and goes to school. Will need to be mod/i before going home to be safe alone. Have ordered wheelchair and tub bench pt to get rolling walker on own. [facility] get [facility] and have placed match in for prescription assistance. Have also made PCP appointment in [facility] here. RGD       Lucy Chris

## 2022-10-26 NOTE — Progress Notes (Signed)
Pt refused all hs medications. Education provided with the risk of non compliance with insulin for diabetic management.

## 2022-10-26 NOTE — Progress Notes (Signed)
Occupational Therapy Discharge Summary  Patient Details  Name: Carlos [name] MRN: [id number] Date of Birth: 01-Aug-1975  Date of Discharge from OT service:Oct 26, 2022  Today's Date: 10/26/2022 OT Individual Time: 9562-1308 OT Individual Time Calculation (min): 40 min   Today's Date: 10/26/2022 OT Individual Time: 6578-4696 OT Individual Time Calculation (min): 42 min   Patient has met 12 of 12 long term goals due to improved activity tolerance, improved balance, postural control, and ability to compensate for deficits.  Patient to discharge at overall Modified Independent level.  Patient's care partner is independent to provide the necessary physical assistance at discharge.    Recommendation:  Patient will benefit from ongoing skilled OT services in home health setting to continue to advance functional skills in the area of iADL, Vocation, and Reduce care partner burden.  Equipment: TTB  Reasons for discharge: treatment goals met  Patient/family agrees with progress made and goals achieved: Yes  OT Discharge Precautions/Restrictions  Precautions Precautions: Fall Precaution Comments: R BKA Required Braces or Orthoses: Other Brace Other Brace: Right limb guard Restrictions Weight Bearing Restrictions: Yes RLE Weight Bearing: Non weight bearing Other Position/Activity Restrictions: Limb guard Pain Pain Assessment Pain Scale: 0-10 Pain Score: 0-No pain ADL ADL Equipment Provided: Long-handled sponge Eating: Independent Where Assessed-Eating: Edge of bed Grooming: Modified independent Where Assessed-Grooming: Wheelchair Upper Body Bathing: Modified independent Where Assessed-Upper Body Bathing: Shower Lower Body Bathing: Modified independent Where Assessed-Lower Body Bathing: Shower Upper Body Dressing: Modified independent (Device) Where Assessed-Upper Body Dressing: Wheelchair Lower Body Dressing: Modified independent Where Assessed-Lower Body Dressing:  Wheelchair Toileting: Modified independent Where Assessed-Toileting: Neurosurgeon Method: Ambulating, Surveyor, minerals: Engineer, technical sales: Modified independent Web designer Method: Conservation officer, historic buildings: Insurance underwriter: Modified independent Film/video editor Method: Warden/ranger: Emergency planning/management officer ADL Comments: overall mod-max a LB self care, D shrinker to R residual limb and max A Right limb protector, min A UB self care, set up grooming seated, amb transfer to commode with B rails with min A, TTB with min A lateral scoot Vision Baseline Vision/History: 1 Wears glasses Patient Visual Report: No change from baseline Vision Assessment?: No apparent visual deficits Perception  Perception: Within Functional Limits Praxis Praxis: Intact Cognition Cognition Overall Cognitive Status: Within Functional Limits for tasks assessed Arousal/Alertness: Awake/alert Memory: Appears intact Awareness: Appears intact Problem Solving: Appears intact Safety/Judgment: Appears intact Sensation Sensation Light Touch: Impaired by gross assessment Hot/Cold: Appears Intact Proprioception: Appears Intact Stereognosis: Appears Intact Additional Comments: LLE neuropathy Coordination Gross Motor Movements are Fluid and Coordinated: Yes Fine Motor Movements are Fluid and Coordinated: Yes Motor  Motor Motor: Within Functional Limits Mobility  Transfers Sit to Stand: Independent with assistive device Stand to Sit: Independent with assistive device  Trunk/Postural Assessment  Cervical Assessment Cervical Assessment: Within Functional Limits Thoracic Assessment Thoracic Assessment: Within Functional Limits Lumbar Assessment Lumbar Assessment: Within Functional Limits Postural Control Postural Control: Within Functional Limits   Balance Balance Balance Assessed: Yes Static Sitting Balance Static Sitting - Balance Support: Feet supported;No upper extremity supported Static Sitting - Level of Assistance: 7: Independent Dynamic Sitting Balance Dynamic Sitting - Balance Support: Feet supported;During functional activity Dynamic Sitting - Level of Assistance: 6: Modified independent (Device/Increase time) Dynamic Sitting - Balance Activities: Lateral lean/weight shifting;Reaching for objects Static Standing Balance Static Standing - Balance Support: Bilateral upper extremity supported Static Standing - Level of Assistance: 6: Modified independent (Device/Increase time) Dynamic  Standing Balance Dynamic Standing - Balance Support: Bilateral upper extremity supported;During functional activity Dynamic Standing - Level of Assistance: 6: Modified independent (Device/Increase time) Dynamic Standing - Balance Activities: Reaching for objects Extremity/Trunk Assessment RUE Assessment RUE Assessment: Within Functional Limits LUE Assessment LUE Assessment: Within Functional Limits  Skilled OT Intervention   Session 1: Pt received sitting in [facility] for skilled OT session with focus on IADLs and discharge planning. Pt agreeable to interventions, demonstrating overall pleasant mood. Pt with no reports of pain. OT offering intermediate rest breaks and positioning suggestions throughout session to address potential pain/fatigue and maximize participation/safety in session.  Pt propels self to all therapy locations with Mod I. In ortho gym, pt participates in simulated house-keeping and laundry tasks. Pt retrieves bean bags from varying heights around gym, requiring education on safely managing WC to retrieve items from floor. Pt able to teach back method of elevating arm-rest to increase functional reach. Pt and OT discuss the benefits of a reacher, to be purchased at patient's discretion.   Pt remained sitting in [facility] with all  immediate needs met at end of session. Pt continues to be appropriate for skilled OT intervention to promote further functional independence.   Session 2: Pt received resting in bed for skilled OT session with focus on BADL participation. Pt agreeable to interventions, demonstrating overall pleasant mood. Pt with no reports of pain OT offering intermediate rest breaks and positioning suggestions throughout session to address pain/fatigue and maximize participation/safety in session.   Pt performs all functional transfers at Mod I level. Pt completed water-proofing of residual limb with setup A, bathing with distant supervision. Pt simulates LB dressing with Mod I. HEP and skin integrity procedures reviewed.   Pt remained resting in bed with all immediate needs met at end of session. Pt continues to be appropriate for skilled OT intervention to promote further functional independence.   Lou Cal, OTR/L, MSOT  10/26/2022, 9:26 AM

## 2022-10-26 NOTE — Plan of Care (Signed)
  Problem: RH Balance Goal: LTG Patient will maintain dynamic standing with ADLs (OT) Description: LTG:  Patient will maintain dynamic standing balance with assist during activities of daily living (OT)  Outcome: Completed/Met   Problem: Sit to Stand Goal: LTG:  Patient will perform sit to stand in prep for activites of daily living with assistance level (OT) Description: LTG:  Patient will perform sit to stand in prep for activites of daily living with assistance level (OT) Outcome: Completed/Met   Problem: RH Grooming Goal: LTG Patient will perform grooming w/assist,cues/equip (OT) Description: LTG: Patient will perform grooming with assist, with/without cues using equipment (OT) Outcome: Completed/Met   Problem: RH Bathing Goal: LTG Patient will bathe all body parts with assist levels (OT) Description: LTG: Patient will bathe all body parts with assist levels (OT) Outcome: Completed/Met   Problem: RH Dressing Goal: LTG Patient will perform upper body dressing (OT) Description: LTG Patient will perform upper body dressing with assist, with/without cues (OT). Outcome: Completed/Met Goal: LTG Patient will perform lower body dressing w/assist (OT) Description: LTG: Patient will perform lower body dressing with assist, with/without cues in positioning using equipment (OT) Outcome: Completed/Met   Problem: RH Toileting Goal: LTG Patient will perform toileting task (3/3 steps) with assistance level (OT) Description: LTG: Patient will perform toileting task (3/3 steps) with assistance level (OT)  Outcome: Completed/Met   Problem: RH Simple Meal Prep Goal: LTG Patient will perform simple meal prep w/assist (OT) Description: LTG: Patient will perform simple meal prep with assistance, with/without cues (OT). Outcome: Completed/Met   Problem: RH Laundry Goal: LTG Patient will perform laundry w/assist, cues (OT) Description: LTG: Patient will perform laundry with assistance,  with/without cues (OT). Outcome: Completed/Met   Problem: RH Light Housekeeping Goal: LTG Patient will perform light housekeeping w/assist (OT) Description: LTG: Patient will perform light housekeeping with assistance, with/without cues (OT). Outcome: Completed/Met   Problem: RH Toilet Transfers Goal: LTG Patient will perform toilet transfers w/assist (OT) Description: LTG: Patient will perform toilet transfers with assist, with/without cues using equipment (OT) Outcome: Completed/Met   Problem: RH Tub/Shower Transfers Goal: LTG Patient will perform tub/shower transfers w/assist (OT) Description: LTG: Patient will perform tub/shower transfers with assist, with/without cues using equipment (OT) Outcome: Completed/Met   

## 2022-10-26 NOTE — Discharge Summary (Signed)
Physician Discharge Summary  Patient ID: Carlos [name] MRN: [id number] DOB/AGE: March 29, 1976 47 y.o.  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, right, sequela (HCC) Active Problems:   Diabetic gastroparesis (HCC)   Hypomagnesemia   Anemia of chronic disease   Gastroesophageal reflux disease   Adjustment disorder   Coping style affecting medical condition   Discharged Condition: stable  Significant Diagnostic Studies: N/A   Labs:  Basic Metabolic Panel: Recent Labs  Lab 10/20/22 0634 10/21/22 0711 10/25/22 0517  NA 134* 133* 135  K 4.4 3.7 4.3  CL 97* 96* 99  CO2 30 27 26   GLUCOSE 194* 114* 171*  BUN 5* <5* 12  CREATININE 0.78 0.75 0.90  CALCIUM 8.0* 8.5* 8.5*  MG 1.6* 1.6*  --     CBC: Recent Labs  Lab 10/20/22 0634 10/21/22 0711 10/25/22 0517  WBC 7.0 6.1 4.2  NEUTROABS  --  3.6 2.1  HGB 8.4* 9.5* 10.3*  HCT 26.4* 29.9* 32.9*  MCV 93.3 93.1 92.9  PLT 375 334 214    CBG: Recent Labs  Lab 10/25/22 1644 10/25/22 2153 10/26/22 0619 10/26/22 1136 10/26/22 1655  GLUCAP 249* 255* 178* 140* 240*      10/27/2022    6:28 AM 10/26/2022    7:36 PM 10/26/2022   12:56 PM  Vitals with BMI  Systolic 163 141 409  Diastolic 91 79 85  Pulse 96  100      Brief HPI:   Carlos [name] is a 47 y.o. male with history of T2DM with neuropathy but no meds x 1 year, right great toe ulceration who was started on doxycycline per urgent care visit on 09/16/2022 but decided to DC due to GI intolerance.  He was encouraged to seek help by his daughter, presented to the ED and was found to be septic with gangrenous changes and foul purulent drainage from wound.  White count was 27.7.  He had acute renal failure with serum creatinine 2.05, sodium 123 and temp 102.8.  He was started on IV antibiotics and fluid boluses and required much encouragement to utilize insulin use to help manage  blood sugar.  He requested a second opinion prior to right BKA by Dr. Lajoyce Corners on 10/10/2022.    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  He was taken back to the OR for repeat I&D on 05/08 and 05/10 with downward trend in white count.  Dr. Daiva Eves recommended narrowing antibiotics to Unasyn and Dr. Lajoyce Corners recommended long-term antibiotics due to persistently elevated white count as well as poorly controlled blood sugars.  Patient continued to refuse medicines as well as insulin.  He started refusing IV antibiotics 24 hours prior to discharge despite multiple providers educating him on compliance as well as importance of infection eradication.  Dr. Algis Liming was following for input and has discussed extensively risk versus benefits of antibiotic as well as oritavancin to provide 7 to 10 days of coverage.  Patient continued to decline antibiotics.  PT OT was working with patient who was limited by weakness, difficulty offloading LLE as well as right BKA.  CIR was recommended due to functional decline.   Hospital Course: Carlos [name] was admitted to rehab 10/20/2022 for inpatient therapies to consist of PT  and OT at least three hours five days a week. Past admission physiatrist, therapy  team and rehab RN have worked together to provide customized collaborative inpatient rehab. He has been afebrile during his stay with serial CBC showing H/H to be slowly improving and WBC has been stable. Follow up check of electrolytes showed hyponatremia has resolved and renal status is stable. He continues to have hypomagnesemia and has refused  supplement.  He has had issues with GI distress and IV Protonix was offered which he was willing to use once but then declined all of the doses.  Dr. Algis Liming also followed up to offer the option of single dose Oritavancin but patient declined this and stated that he was willing to resume antibiotics  if he became septic again.   Blood pressures were monitored on TID basis and continue to be labile. Diabetes has been monitored with ac/hs CBG checks and blood sugars continue to be poorly controlled. He has continued to refuse use of short acting or basal insulin during his stay.  He was willing to use Lovenox a coupe of times and has refused all other medications despite extensi bilateral ve education by staff and providers. Follow up check of CBC showed H/H to be improving and WBC is stable. Check of BMET showed renal  status to be stable and hyponatremia has resolved.  Incision is intact with min serosanguinous drainage.  He has made steady progress during his rehab stay and is modified independent at discharge.[name] He has been educated on HEP and charity HH not available.    Rehab course: During patient's stay in rehab weekly team conferences were held to monitor patient's progress, set goals and discuss barriers to discharge. At admission, patient required mod assist with ADL tasks and CGA for mobility.  He  has had improvement in activity tolerance, balance, postural control as well as ability to compensate for deficits. He is able to complete ADL tasks at modified independent level.  He is modified independent for transfers and to ambulate 100' with use of RW.    Disposition: Home  Diet: Carb Modified.  Special Instructions: Wash incision with soap and water, keep clean and dry.   Allergies as of 10/26/2022   No Known Allergies      Medication List     STOP taking these medications    acetaminophen 325 MG tablet Commonly known as: TYLENOL   alum & mag hydroxide-simeth 200-200-20 MG/5ML suspension Commonly known as: MAALOX/MYLANTA   Ampicillin-Sulbactam 3 g in sodium chloride 0.9 % 100 mL   bisacodyl 5 MG EC tablet Commonly known as: DULCOLAX   docusate sodium 100 MG capsule Commonly known as: COLACE   guaiFENesin-dextromethorphan 100-10 MG/5ML syrup Commonly known as:  ROBITUSSIN DM   heparin 5000 UNIT/ML injection   HYDROmorphone 1 MG/ML injection Commonly known as: DILAUDID   insulin aspart 100 UNIT/ML injection Commonly known as: novoLOG   insulin glargine-yfgn 100 UNIT/ML injection Commonly known as: SEMGLEE   morphine (PF) 2 MG/ML injection   ondansetron 4 MG/2ML Soln injection Commonly known as: ZOFRAN   oxyCODONE 5 MG immediate release tablet Commonly known as: Oxy IR/ROXICODONE   Oxycodone HCl 10 MG Tabs   pantoprazole 40 MG tablet Commonly known as: PROTONIX   polyethylene glycol 17 g packet Commonly known as: MIRALAX / GLYCOLAX   potassium chloride SA 20 MEQ tablet Commonly known as: KLOR-CON M   senna-docusate 8.6-50 MG tablet Commonly known as: Senokot-S   zinc sulfate 220 (50 Zn) MG capsule       TAKE these medications    ascorbic acid  1000 MG tablet Commonly known as: VITAMIN C Take 1 tablet (1,000 mg total) by mouth daily.   magnesium oxide 400 (240 Mg) MG tablet Commonly known as: MAG-OX Take 1 tablet (400 mg total) by mouth 2 (two) times daily.        Follow-up Information     Grayce Sessions, NP Follow up on 11/03/2022.   Specialty: Internal Medicine Why: Appointment @ 3;45 PM Contact information: [address] [place] [address] [phone]         Genice Rouge, MD Follow up.   Specialty: Physical Medicine and Rehabilitation Why: office will call you with follow up appointment Contact information: [address] [place] [address] [phone]         [name], MD Follow up.   Specialty: Orthopedic Surgery Contact information: [address] [place] [address] [phone]         Daiva Eves, Lisette Grinder, MD Follow up on 11/09/2022.   Specialty: Infectious Diseases Why: Be there at 2:45 for 3 pm appointment Contact information: [address] [place] [address] [phone]                 Signed: Jacquelynn Cree 10/26/2022, 4:56 PM

## 2022-10-26 NOTE — Progress Notes (Signed)
Patient ID: Carlos [name], male   DOB: Apr 28, 1976, 47 y.o.   MRN: [id number]  Met with pt to give team conference update regarding goals of mod/I wheelchair level made mod/I in room today in preparation for discharge tomorrow. Pt feels comfortable with this and aware worker is trying to get charity home health will let know when hear back from agency if can accept the referral. Family education completed with daughter yesterday. Pt reports he has applied for food stamps on-line and will apply for any other resource he is eligible for. Equipment in his room and ready for discharge tomorrow.

## 2022-10-26 NOTE — Patient Care Conference (Signed)
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 Genice Rouge Social Worker Present: Dossie Der, LCSW Nurse Present: Vedia Pereyra, RN PT Present: Bernie Covey, PT OT Present: Lou Cal, OT PPS Coordinator present : Fae Pippin, SLP     Current Status/Progress Goal Weekly Team Focus  Bowel/Bladder   Pt is continent B/B  LBM 10/25/22   Will maintain normal B/B pattern   Assist with toileting qshift/prn    Swallow/Nutrition/ Hydration               ADL's   CGA-supervision for functional transfers, supervision for all ADLs   Mod I, supervision for IADLs   Caregiver education and discharge planning    Mobility   mod I transfers and bed mobility, w/c mobility   Mod I, tentative discharge 5/23 or 5/24  d/c planning, education, transfers, LE strength    Communication                Safety/Cognition/ Behavioral Observations               Pain   Denies pain on this shift   Will be free from pain   Assess pt for pain qshift/prn    Skin   Surgical incision (Right BKA)   Will be free from infection   Promote healing and assess for s/s of infection qshift/prn      Discharge Planning:  Home with 55 yo daughter who works and goes to school. Will need to be mod/i before going home to be safe alone. Have ordered wheelchair and tub bench pt to get rolling walker on own. Trying to get charity home health and have placed match in for prescription assistance. Have also made P[facility].   Team Discussion: Right BKA. Continent. Denies pain. Diet education provided. Patient reported that "knows how to manage his diet and diabetes". Patient refuses IV antibiotic, insulin and other medications. Will take Lovenox. Has dressing changes to burn on right arm with unattached edges and no drainage.  Patient on target to meet rehab goals: yes, patient meeting therapy goals for discharge 10/27/22  *See Care Plan and progress notes for long and short-term goals.   Revisions to Treatment Plan:  Incision to right BKA to be unwrapped in AM for MD to view. RN made aware.  Teaching Needs: Medications, safety, self care, diet education, transfer training, etc.   Current Barriers to Discharge: Decreased caregiver support, IV antibiotics, Wound care, Weight, Weight bearing restrictions, and Medication compliance  Possible Resolutions to Barriers: Family education, nursing education, resources in place, recommended DME ordered and will have to get RW on  his own.      Medical Summary Current Status: competent to make decisions- has serosanguinous drainage- improving- shrinker- refusing all meds including Magnesium- doing applicaiton for disability  Barriers to Discharge: Uncontrolled Diabetes;Weight bearing restrictions;Self-care education;Complicated Wound  Barriers to Discharge Comments: r3efuises all meds- limtied by uninsured- will not do H/H- will not me assess wounds tomorrow Possible Resolutions to [employer] Focus: uninsured- limitations- mod I functionally- ready  for d/c- no pain- d/c 5/22   Continued Need for Acute Rehabilitation Level of Care: The patient requires daily medical management by a physician with specialized training in physical medicine and rehabilitation for the following reasons: Direction of a multidisciplinary physical rehabilitation program to maximize functional independence : Yes Medical management of patient stability for increased activity during participation in an intensive rehabilitation regime.: Yes Analysis of laboratory values and/or radiology reports with any subsequent need for medication adjustment and/or medical intervention. : Yes   I attest that I was present, lead the team conference, and concur with the assessment and plan of the team.   Jearld Adjutant 10/26/2022, 6:27 PM

## 2022-10-26 NOTE — Discharge Instructions (Signed)
Inpatient Rehab Discharge Instructions  Carlos [name] Discharge date and time: 10/27/22   Activities/Precautions/ Functional Status: Activity: activity as tolerated Diet: diabetic diet Wound Care: Wash with soap and water, pat dry and keep wound clean and dry. Do not soak the limb. Do not put creams, oils or lotions. Cover with stump shrinker.    Functional status:  ___ No restrictions     ___ Walk up steps independently ___ 24/7 supervision/assistance   ___ Walk up steps with assistance _X__ Intermittent supervision/assistance  ___ Bathe/dress independently ___ Walk with walker     _X__ Bathe/dress with assistance ___ Walk Independently    ___ Shower independently ___ Walk with assistance    ___ Shower with assistance _X__ No alcohol     ___ Return to work/school ________  Special Instructions:  COMMUNITY REFERRALS UPON DISCHARGE:    Home EXERCISE PROGRAM GIVEN [facility] DENIED HIS REFERRAL                     Medical Equipment/Items Ordered:WHEELCHAIR AND TUB BENCH WILL GET ROLLING WALKER ON OWN                                                 Agency/Supplier:[facility]   [phone]  [facility] REFERRAL MADE FOR ASSISTANCE WITH SSD/SSI APPLICATIONS [phone] [email or web address]  My questions have been answered and I understand these instructions. I will adhere to these goals and the provided educational materials after my discharge from the hospital.  Patient/Caregiver Signature _______________________________ Date __________  Clinician Signature _______________________________________ Date __________  Please bring this form and your medication list with you to all your follow-up doctor's appointments.

## 2022-10-26 NOTE — Progress Notes (Signed)
PROGRESS NOTE   Subjective/Complaints:  Pt reports feeling pretty good this AM LBM yesterday No drainage from R BKA per pt- was just redressed so asked me not to undress it.   Said therapy has been showering him- but asked how could do at home- went over plan.    ROS:   Pt denies SOB, abd pain, CP, N/V/C/D, and vision changes  Except for HPI    Objective:   No results found. Recent Labs    10/25/22 0517  WBC 4.2  HGB 10.3*  HCT 32.9*  PLT 214    Recent Labs    10/25/22 0517  NA 135  K 4.3  CL 99  CO2 26  GLUCOSE 171*  BUN 12  CREATININE 0.90  CALCIUM 8.5*     Intake/Output Summary (Last 24 hours) at 10/26/2022 9604 Last data filed at 10/26/2022 0700 Gross per 24 hour  Intake 448 ml  Output 1050 ml  Net -602 ml        Physical Exam: Vital Signs Blood pressure (!) 145/91, pulse (!) 104, temperature 98.6 F (37 C), temperature source Oral, resp. rate 18, height 5\' 9"  (1.753 m), weight 102 kg, SpO2 100 %.         General: awake, alert, appropriate, sitting EOB; NAD HENT: conjugate gaze; oropharynx moist CV: regular rhythm, slightly tachycardic rate; no JVD Pulmonary: CTA B/L; no W/R/R- good air movement GI: soft, NT, ND, (+)BS- normoactive Psychiatric: appropriate- pleasant, asking about showering at home Neurological: Ox3- competent MS: pt wearing shrinker and limb guard  Neuro:  Alert and oriented, follows commands, CN 2-12 grossly intact, no speech or language deficits noted  Strength 5/5 in b/l UE and LLE Strength 5/5 hip flexion RLE, able to extend R knee to gravity Sensation intact to LT in b/l UE and LLE , intact to LT L residual limb No ataxia or dysmetria noted Musculoskeletal: No joint swelling or tenderness noted  Skin- R BKA wrapped with ACE wrap and kerlex- C/D/I-  R forearm has healing blisters- ~ 60-70% healed   Assessment/Plan: 1. Functional deficits which require  3+ hours per day of interdisciplinary therapy in a comprehensive inpatient rehab setting. Physiatrist is providing close team supervision and 24 hour management of active medical problems listed below. Physiatrist and rehab team continue to assess barriers to discharge/monitor patient progress toward functional and medical goals  Care Tool:  Bathing    Body parts bathed by patient: Right arm, Left arm, Chest, Abdomen, Front perineal area, Buttocks, Right upper leg, Left upper leg, Left lower leg, Face     Body parts n/a: Right lower leg   Bathing assist Assist Level: Moderate Assistance - Patient 50 - 74%     Upper Body Dressing/Undressing Upper body dressing   What is the patient wearing?: Hospital gown only    Upper body assist Assist Level: Minimal Assistance - Patient > 75%    Lower Body Dressing/Undressing Lower body dressing      What is the patient wearing?: Ace wrap/stump shrinker, Hospital gown only     Lower body assist Assist for lower body dressing: Maximal Assistance - Patient 25 - 49%  Toileting Toileting    Toileting assist Assist for toileting: Independent (urinal)     Transfers Chair/bed transfer  Transfers assist     Chair/bed transfer assist level: Independent with assistive device Chair/bed transfer assistive device: Geologist, engineering   Ambulation assist      Assist level: Independent with assistive device Assistive device: Walker-rolling Max distance: 136ft   Walk 10 feet activity   Assist     Assist level: Independent with assistive device Assistive device: Walker-rolling   Walk 50 feet activity   Assist Walk 50 feet with 2 turns activity did not occur: Safety/medical concerns (fatigue)  Assist level: Independent with assistive device Assistive device: Walker-rolling    Walk 150 feet activity   Assist Walk 150 feet activity did not occur: Safety/medical concerns (endurance deficit)         Walk  10 feet on uneven surface  activity   Assist     Assist level: Independent with assistive device Assistive device: Walker-rolling   Wheelchair     Assist Is the patient using a wheelchair?: Yes Type of Wheelchair: Manual    Wheelchair assist level: Independent Max wheelchair distance: 345ft    Wheelchair 50 feet with 2 turns activity    Assist        Assist Level: Independent   Wheelchair 150 feet activity     Assist      Assist Level: Independent   Blood pressure (!) 145/91, pulse (!) 104, temperature 98.6 F (37 C), temperature source Oral, resp. rate 18, height 5\' 9"  (1.753 m), weight 102 kg, SpO2 100 %.   Medical Problem List and Plan: 1. 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  Antithrombotics: -DVT/anticoagulation:  Pharmaceutical: Lovenox             -antiplatelet therapy: N/A 3. Pain Management: Tylenol prn. 5/16- pt refusing meds- says has no pain/phantom pain  4. Mood/Behavior/Sleep: LCSW to follow for evaluation and support.              -antipsychotic agents: N/A 5. Neuropsych/cognition: This patient IS capable of making decisions on his own behalf.  -Seen by neuropsychology- appreciate 6. Skin/Wound Care: Routine pressure relief measures.   5/16- will order Shrinkers x2 to wear and to wash for R BKA  5/17- got shrinkers  5/21- got new shrinkers- d/w pt how to shower at home- suggest small/medium trash bag and tie/tape above knee 7. Fluids/Electrolytes/Nutrition: Monitor I/O. Refusing all meds as it makes him sick             --briefly dicussed need for meds but he declines due to severe reflux/nausea             --will d/c vitamins  and colace to simplify regimen 8. Strep oralis/mitis Bacteremia: Has been refusing IV antibiotics and any meds since yesterday pm.    5/16- continues to refuse ABX of any kind- knows he's at risk of becoming septic and mortality if becomes septic- WBC is 6.1- ID has already spoken with him  5/18- labs q Monday to  f/u on CBC  5/20 WBC stable at 4.2, stable 9. Severe GERD: Has refusing PPI--but after discussion was willing to try IV for relief.   5/17- refusing any PPI  5/18- still refusing 10. T2DM: Hgb A1C- 10.4 , previously had blood sugars over 500 although now appear improved, he continues to refuse insulin.    5/16- CBG's running 115 this AM to 180- educated pt to try and keep CBGs below 150 for improved wound healing- also probably the reason he's voiding so much  5/17- CBGs 115-178- refusing all SSI, etc- says he usually eats just breakfast/dinner, but ate lunch yesterday. 5.18- refusing all insulin-  CBGs 111-178- con't to encourage pt to take meds 5/20 CBG elevated at times, he refuses medications 11. Hyponatremia: 10/20/2022 NA 134 recheck in am  5/16- pending  5/17- Na 133-   5/20 Na stable 135 12. Anemia of chronic illness?:  10/20/2022 his Hgb was 8.4 recheck in am.   5/17- Hb up to 9.5  5/20 HGB up to 10.3, a little improved 13.  Diabetic gastroparesis.  Refused Reglan  5/18- having a lot of gastroparesis- however doesn't want laxatives or Reglan.   5/19- will change diet to regular diet to see if can help him with better/choices for gastroparesis/abd irritation.   5/20 LBM today 14.  Hypomagnesemia.  Recheck tomorrow   5/16- Refusing all PO meds including Mg- last Mg 1.6- pending this AM  5/17- Mg 1.6- refusing any intervention  5/18- pt still refusing meds 15. L ear pain  5/16- assessed ear myself- no sore- will order Debrox 5 drops L ear BID x 4 days to clean out wax.   Pt still refusing all medicines and is competent to make decisions    I spent a total of 39    minutes on total care today- >50% coordination of care- due to d/w pt about refusing meds; also how to shower at home; and team conference to determine length of stay  LOS: 6 days A FACE TO FACE EVALUATION WAS PERFORMED  Kristiana Jacko 10/26/2022, 9:22 AM

## 2022-10-27 ENCOUNTER — Other Ambulatory Visit (HOSPITAL_COMMUNITY): Payer: Self-pay

## 2022-10-27 LAB — GLUCOSE, CAPILLARY: Glucose-Capillary: 159 mg/dL — ABNORMAL HIGH (ref 70–99)

## 2022-10-27 NOTE — Progress Notes (Signed)
PROGRESS NOTE   Subjective/Complaints:  Pt reports doing well- denies pain;   Said didn't think was having too much drainage from R BKA, however has drained onto bed.  ROS:   Pt denies SOB, abd pain, CP, N/V/C/D, and vision changes   Except for HPI    Objective:   No results found. Recent Labs    10/25/22 0517  WBC 4.2  HGB 10.3*  HCT 32.9*  PLT 214    Recent Labs    10/25/22 0517  NA 135  K 4.3  CL 99  CO2 26  GLUCOSE 171*  BUN 12  CREATININE 0.90  CALCIUM 8.5*     Intake/Output Summary (Last 24 hours) at 10/27/2022 0817 Last data filed at 10/27/2022 0630 Gross per 24 hour  Intake --  Output 1000 ml  Net -1000 ml        Physical Exam: Vital Signs Blood pressure (!) 163/91, pulse 96, temperature 98.1 F (36.7 C), temperature source Oral, resp. rate 18, height 5\' 9"  (1.753 m), weight 102 kg, SpO2 100 %.          General: awake, alert, appropriate, sitting up in bed after woke him up; NAD HENT: conjugate gaze; oropharynx moist CV: regular rate; no JVD Pulmonary: CTA B/L; no W/R/R- good air movement GI: soft, NT, ND, (+)BS Psychiatric: appropriate- competent Neurological: Ox3  MS: pt wearing shrinker and limb guard- has 1 area very small amount of dehiscence medially where drainage- serosanguinous drainage appears to be coming from- kerlex not soaked, but is ~25% covered with drainage, and on bed- had shrinker on first, then dressing over it    Neuro:  Alert and oriented, follows commands, CN 2-12 grossly intact, no speech or language deficits noted  Strength 5/5 in b/l UE and LLE Strength 5/5 hip flexion RLE, able to extend R knee to gravity Sensation intact to LT in b/l UE and LLE , intact to LT L residual limb No ataxia or dysmetria noted Musculoskeletal: No joint swelling or tenderness noted  Skin- R BKA wrapped with ACE wrap and kerlex- C/D/I-  R forearm has healing blisters-  ~ 60-70% healed   Assessment/Plan: 1. Functional deficits which require 3+ hours per day of interdisciplinary therapy in a comprehensive inpatient rehab setting. Physiatrist is providing close team supervision and 24 hour management of active medical problems listed below. Physiatrist and rehab team continue to assess barriers to discharge/monitor patient progress toward functional and medical goals  Care Tool:  Bathing    Body parts bathed by patient: Right arm, Left arm, Chest, Abdomen, Front perineal area, Buttocks, Right upper leg, Left upper leg, Left lower leg, Face     Body parts n/a: Right lower leg   Bathing assist Assist Level: Independent with assistive device     Upper Body Dressing/Undressing Upper body dressing   What is the patient wearing?: Pull over shirt    Upper body assist Assist Level: Independent with assistive device    Lower Body Dressing/Undressing Lower body dressing      What is the patient wearing?: Ace wrap/stump shrinker, Pants     Lower body assist Assist for lower body dressing: Independent with assitive  device     Toileting Toileting    Toileting assist Assist for toileting: Independent with assistive device     Transfers Chair/bed transfer  Transfers assist     Chair/bed transfer assist level: Independent with assistive device Chair/bed transfer assistive device: Geologist, engineering   Ambulation assist      Assist level: Independent with assistive device Assistive device: Walker-rolling Max distance: 117ft   Walk 10 feet activity   Assist     Assist level: Independent with assistive device Assistive device: Walker-rolling   Walk 50 feet activity   Assist Walk 50 feet with 2 turns activity did not occur: Safety/medical concerns (fatigue)  Assist level: Independent with assistive device Assistive device: Walker-rolling    Walk 150 feet activity   Assist Walk 150 feet activity did not occur:  Safety/medical concerns (endurance deficit)         Walk 10 feet on uneven surface  activity   Assist     Assist level: Independent with assistive device Assistive device: Walker-rolling   Wheelchair     Assist Is the patient using a wheelchair?: Yes Type of Wheelchair: Manual    Wheelchair assist level: Independent Max wheelchair distance: 350ft    Wheelchair 50 feet with 2 turns activity    Assist        Assist Level: Independent   Wheelchair 150 feet activity     Assist      Assist Level: Independent   Blood pressure (!) 163/91, pulse 96, temperature 98.1 F (36.7 C), temperature source Oral, resp. rate 18, height 5\' 9"  (1.753 m), weight 102 kg, SpO2 100 %.   Medical Problem List and Plan: 1. 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  2.  Antithrombotics: -DVT/anticoagulation:  Pharmaceutical: Lovenox             -antiplatelet therapy: N/A 3. Pain Management: Tylenol prn. 5/16- pt refusing meds- says has no pain/phantom pain  4. Mood/Behavior/Sleep: LCSW to follow for evaluation and support.              -antipsychotic agents: N/A 5. Neuropsych/cognition: This patient IS capable of making decisions on his own behalf.  -Seen by neuropsychology- appreciate 6. Skin/Wound Care: Routine pressure relief measures.   5/16- will order Shrinkers x2 to wear and to wash for R BKA  5/17- got shrinkers  5/21- got new shrinkers- d/w pt how to shower at home- suggest small/medium trash bag and tie/tape above knee 7. Fluids/Electrolytes/Nutrition: Monitor I/O. Refusing all meds as it makes him sick             --briefly dicussed need for  meds but he declines due to severe reflux/nausea             --will d/c vitamins and colace to simplify regimen 8. Strep oralis/mitis Bacteremia: Has been refusing IV antibiotics and any meds since yesterday pm.    5/16- continues to refuse ABX of any kind- knows he's at risk of becoming septic and mortality if becomes septic- WBC is  6.1- ID has already spoken with him  5/18- labs q Monday to f/u on CBC  5/20 WBC stable at 4.2, stable 9. Severe GERD: Has refusing PPI--but after discussion was willing to try IV for relief.   5/17- refusing any PPI  5/18- still refusing 10. T2DM: Hgb A1C- 10.4 , previously had blood sugars over 500 although now appear improved, he continues to refuse insulin.    5/16- CBG's running 115 this AM to 180- educated pt to try and keep CBGs below 150 for improved wound healing- also probably the reason he's voiding so much  5/17- CBGs 115-178- refusing all SSI, etc- says he usually eats just breakfast/dinner, but ate lunch yesterday. 5.18- refusing all insulin-  CBGs 111-178- con't to encourage pt to take meds 5/20 CBG elevated at times, he refuses medications 11. Hyponatremia: 10/20/2022 NA 134 recheck in am  5/16- pending  5/17- Na 133-   5/20 Na stable 135 12. Anemia of chronic illness?:  10/20/2022 his Hgb was 8.4 recheck in am.   5/17- Hb up to 9.5  5/20 HGB up to 10.3, a little improved 13.  Diabetic gastroparesis.  Refused Reglan  5/18- having a lot of gastroparesis- however doesn't want laxatives or Reglan.   5/19- will change diet to regular diet to see if can help him with better/choices for gastroparesis/abd irritation.   5/20 LBM today 14.  Hypomagnesemia.  Recheck tomorrow   5/16- Refusing all PO meds including Mg- last Mg 1.6- pending this AM  5/17- Mg 1.6- refusing any intervention  5/18- pt still refusing meds 15. L ear pain  5/16- assessed ear myself- no sore- will order Debrox 5 drops L ear BID x 4 days to clean out wax.   Pt still refusing all  medicines and is competent to make decisions- no change    I spent a total of 34   minutes on total care today- >50% coordination of care- due to removal of Dressings, and taking picture of R BKA as well as education about planned f/u.     LOS: 7 days A FACE TO FACE EVALUATION WAS PERFORMED  Desaree Downen 10/27/2022, 8:17 AM

## 2022-10-27 NOTE — Progress Notes (Signed)
Inpatient Rehabilitation Discharge Medication Review by a Pharmacist  A complete drug regimen review was completed for this patient to identify any potential clinically significant medication issues.  High Risk Drug Classes Is patient taking? Indication by Medication  Antipsychotic No   Anticoagulant No   Antibiotic No   Opioid No   Antiplatelet No   Hypoglycemics/insulin No   Vasoactive Medication No   Chemotherapy No   Other Yes Vitamin C and Magnesium Oxide - supplements     Type of Medication Issue Identified Description of Issue Recommendation(s)  Drug Interaction(s) (clinically significant)     Duplicate Therapy     Allergy     No Medication Administration End Date     Incorrect Dose     Additional Drug Therapy Needed     Significant med changes from prior encounter (inform family/care partners about these prior to discharge).    Other       Clinically significant medication issues were identified that warrant physician communication and completion of prescribed/recommended actions by midnight of the next day:  No  Pharmacist comments:   - He has been refusing medications, including antibiotic (Unasyn) while inpatient. Providers are aware.   Time spent performing this drug regimen review (minutes):  20  Dennie Fetters, [place] 10/27/2022 9:56 AM

## 2022-10-27 NOTE — Progress Notes (Signed)
INPATIENT REHABILITATION DISCHARGE NOTE   Discharge instructions by: Elita Quick PA  Verbalized understanding: yes  Skin care/Wound care healing? Yes. Education provided to patient  Pain: none  IV's: none  Tubes/Drains: none  O2: none  Safety instructions: reviewed with pt  Patient belongings: dent with pt  Discharged to: home  Discharged via: family transport  Notes: done  Marylu Lund, Charity fundraiser

## 2022-11-02 ENCOUNTER — Encounter: Payer: Self-pay | Admitting: Orthopedic Surgery

## 2022-11-03 ENCOUNTER — Inpatient Hospital Stay (INDEPENDENT_AMBULATORY_CARE_PROVIDER_SITE_OTHER): Payer: Self-pay | Admitting: Primary Care

## 2022-11-08 NOTE — Progress Notes (Deleted)
   Subjective:    Patient ID: Carlos [name], male    DOB: 1976-02-08, 47 y.o.   MRN: [id number]  HPI   Carlos [name] is a 47 y.o. 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 He is grew operative site has grown Streptococcus mitis oralis   Past Medical History:  Diagnosis Date   Diabetes mellitus     Past Surgical History:  Procedure Laterality Date   AMPUTATION Right 10/10/2022   Procedure: AMPUTATION BELOW KNEE;  Surgeon: [name], MD;  Location: [facility] OR;  Service: Orthopedics;  Laterality: Right;   STUMP REVISION Right 10/13/2022   Procedure: REVISION RIGHT BELOW KNEE AMPUTATION;  Surgeon: [name], MD;  Location: [facility] OR;  Service: Orthopedics;  Laterality: Right;   STUMP REVISION Right 10/15/2022   Procedure: REVISION RIGHT BELOW KNEE AMPUTATION;  Surgeon: [name], MD;  Location: [facility] OR;  Service: Orthopedics;  Laterality: Right;   TEE WITHOUT CARDIOVERSION N/A 10/14/2022   Procedure: TRANSESOPHAGEAL ECHOCARDIOGRAM;  Surgeon: Quintella Reichert, MD;  Location: [facility] INVASIVE CV LAB;  Service: Cardiovascular;  Laterality: N/A;    No family history on file.    Social History   Socioeconomic History   Marital status: Single    Spouse name: Not on file   Number of children: Not on file   Years of education: Not on file   Highest education level: Not on file  Occupational History   Not on file  Tobacco Use   Smoking status: Never   Smokeless tobacco: Not on file  Vaping Use   Vaping Use: Never used  Substance and Sexual Activity   Alcohol use: Yes    Comment: rarely   Drug use: No   Sexual activity: Not on file  Other Topics Concern   Not on file  Social  History Narrative   Not on file   Social Determinants of Health   Financial Resource Strain: Not on file  Food Insecurity: No Food Insecurity (10/08/2022)   Hunger Vital Sign    Worried About Running Out of Food in the Last Year: Never true    Ran Out of Food in the Last Year: Never true  Transportation Needs: No Transportation Needs (10/08/2022)   PRAPARE - Administrator, Civil Service (Medical): No    Lack of Transportation (Non-Medical): No  Physical Activity: Not on file  Stress: Not on file  Social Connections: Not on file    No Known Allergies   Current Outpatient Medications:    ascorbic acid (VITAMIN C) 1000 MG tablet, Take 1 tablet (1,000 mg total) by mouth daily., Disp: 100 tablet, Rfl: 0   magnesium oxide (MAG-OX) 400 MG tablet, Take 1 tablet (400 mg total) by mouth 2 (two) times daily., Disp: 120 tablet, Rfl: 0    Review of Systems     Objective:   Physical Exam        Assessment & Plan:

## 2022-11-09 ENCOUNTER — Ambulatory Visit: Payer: Medicaid Other | Admitting: Infectious Disease

## 2022-11-09 ENCOUNTER — Encounter: Payer: Self-pay | Admitting: Family

## 2022-11-09 DIAGNOSIS — S88111S Complete traumatic amputation at level between knee and ankle, right lower leg, sequela: Secondary | ICD-10-CM

## 2022-11-09 DIAGNOSIS — I96 Gangrene, not elsewhere classified: Secondary | ICD-10-CM

## 2022-11-10 ENCOUNTER — Encounter: Payer: Self-pay | Admitting: Family

## 2022-11-10 ENCOUNTER — Ambulatory Visit (INDEPENDENT_AMBULATORY_CARE_PROVIDER_SITE_OTHER): Payer: Self-pay | Admitting: Family

## 2022-11-10 DIAGNOSIS — Z89511 Acquired absence of right leg below knee: Secondary | ICD-10-CM

## 2022-11-10 DIAGNOSIS — S88111S Complete traumatic amputation at level between knee and ankle, right lower leg, sequela: Secondary | ICD-10-CM

## 2022-11-10 NOTE — Progress Notes (Addendum)
Post-Op Visit Note   Patient: Carlos [name]           Date of Birth: 12-21-1975           MRN: [id number] Visit Date: 11/10/2022 PCP: System, Provider Not In  Chief Complaint:  Chief Complaint  Patient presents with   Right Leg - Routine Post Op    10/15/2022 revision right BKA     HPI:  HPI The patient is a 47 year old gentleman who is seen status post below-knee amputation revision on the right which is healing quite well he does continue to wear his limb protector  Patient is a new right transtibial  amputee.  Patient's current comorbidities are not expected to impact the ability to function with the prescribed prosthesis. Patient verbally communicates a strong desire to use a prosthesis. Patient currently requires mobility aids to ambulate without a prosthesis.  Expects not to use mobility aids with a new prosthesis.  Patient is a K3 level ambulator that spends a lot of time walking around on uneven terrain over obstacles, up and down stairs, and ambulates with a variable cadence.    Ortho Exam On examination of right residual limb this is well consolidated well-healed sutures in place these were harvested today without incident there is no drainage no erythema or warmth Visit Diagnoses: No diagnosis found.  Plan: Given an order for prosthesis set up.  Continue shrinker May discontinue the limb protector when at rest.  Follow-Up Instructions: No follow-ups on file.   Imaging: No results found.  Orders:  No orders of the defined types were placed in this encounter.  No orders of the defined types were placed in this encounter.    PMFS History: Patient Active Problem List   Diagnosis Date Noted   Coping style affecting medical condition 10/25/2022   Adjustment disorder 10/22/2022   Complete below knee amputation of lower extremity, right, sequela (HCC) 10/20/2022   Hypomagnesemia 10/20/2022   Anemia of chronic disease 10/20/2022   Gastroesophageal reflux  disease 10/20/2022   Necrotizing fasciitis of lower leg (HCC) 10/10/2022   Gangrene of right foot (HCC) 10/09/2022   Cellulitis of right lower extremity 10/09/2022   Diabetic foot ulcer (HCC) 10/08/2022   Diabetes (HCC) 10/08/2022   Severe sepsis (HCC) 10/08/2022   Hyponatremia 10/08/2022   Lactic acidosis 10/08/2022   AKI (acute kidney injury) (HCC) 10/08/2022   Diabetic gastroparesis (HCC) 10/08/2022   Elevated troponin 10/08/2022   H/O: CVA (cerebrovascular accident) 10/08/2022   Past Medical History:  Diagnosis Date   Diabetes mellitus     No family history on file.  Past Surgical History:  Procedure Laterality Date   AMPUTATION Right 10/10/2022   Procedure: AMPUTATION BELOW KNEE;  Surgeon: [name], MD;  Location: [facility] OR;  Service: Orthopedics;  Laterality: Right;   STUMP REVISION Right 10/13/2022   Procedure: REVISION RIGHT BELOW KNEE AMPUTATION;  Surgeon: [name], MD;  Location: [facility] OR;  Service: Orthopedics;  Laterality: Right;   STUMP REVISION Right 10/15/2022   Procedure: REVISION RIGHT BELOW KNEE AMPUTATION;  Surgeon: [name], MD;  Location: [facility] OR;  Service: Orthopedics;  Laterality: Right;   TEE WITHOUT CARDIOVERSION N/A 10/14/2022   Procedure: TRANSESOPHAGEAL ECHOCARDIOGRAM;  Surgeon: Quintella Reichert, MD;  Location: [facility] INVASIVE CV LAB;  Service: Cardiovascular;  Laterality: N/A;   Social History   Occupational History   Not on file  Tobacco Use   Smoking status: Never   Smokeless tobacco: Not on file  Vaping Use   Vaping Use: Never used  Substance and Sexual Activity   Alcohol use: Yes    Comment: rarely   Drug use: No   Sexual activity: Not on file

## 2022-11-24 ENCOUNTER — Encounter
Payer: Medicaid Other | Attending: Physical Medicine and Rehabilitation | Admitting: Physical Medicine and Rehabilitation

## 2022-11-25 IMAGING — CR DG CHEST 2V
2 series · 2 of 2 positions shown · non-contrast
Comparison: Comparison made with April 23, 2021.

CLINICAL DATA: Abdominal pain for 3D days in a 45-year-old male.

EXAM:
CHEST - 2 VIEW

[w chest lat]
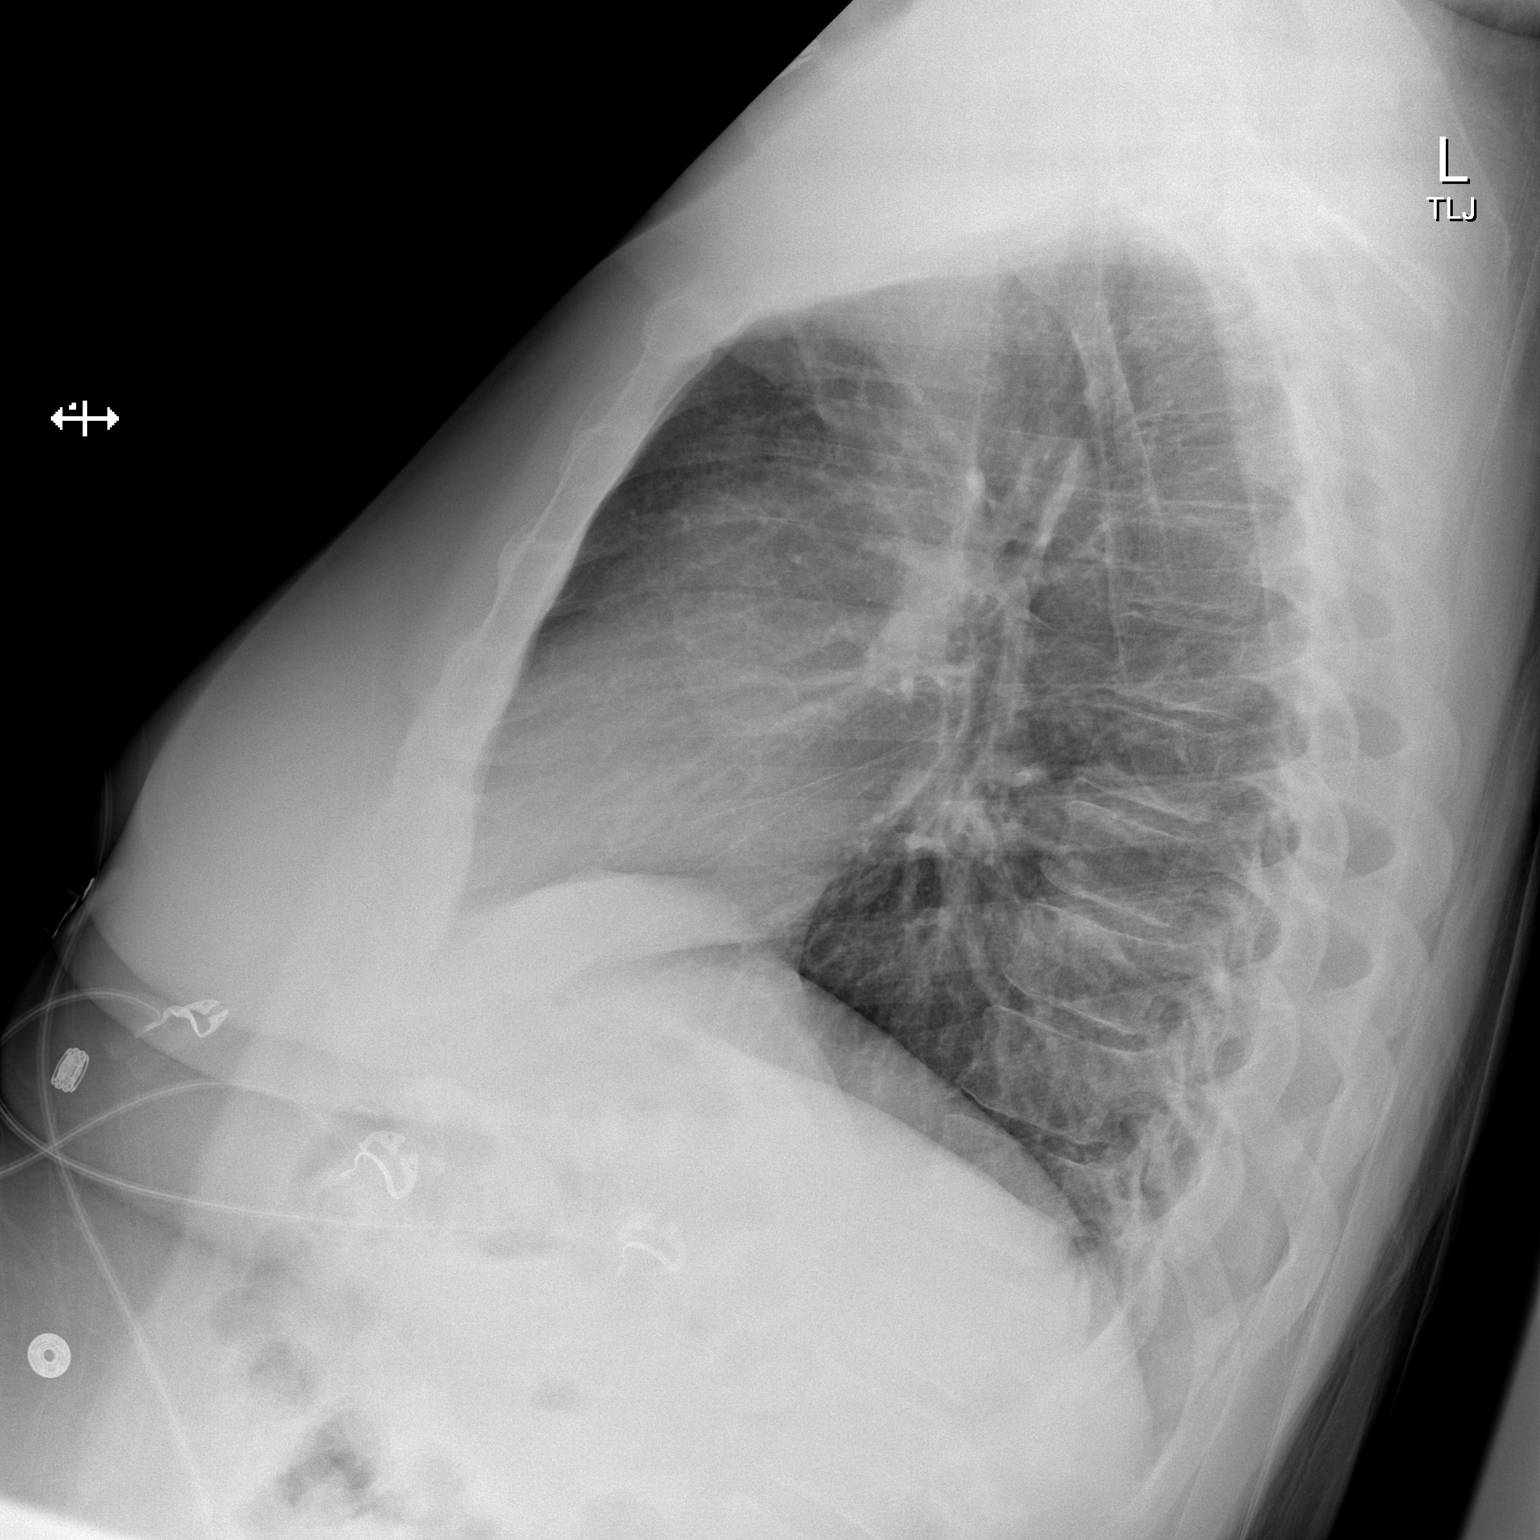

[x chest ap]
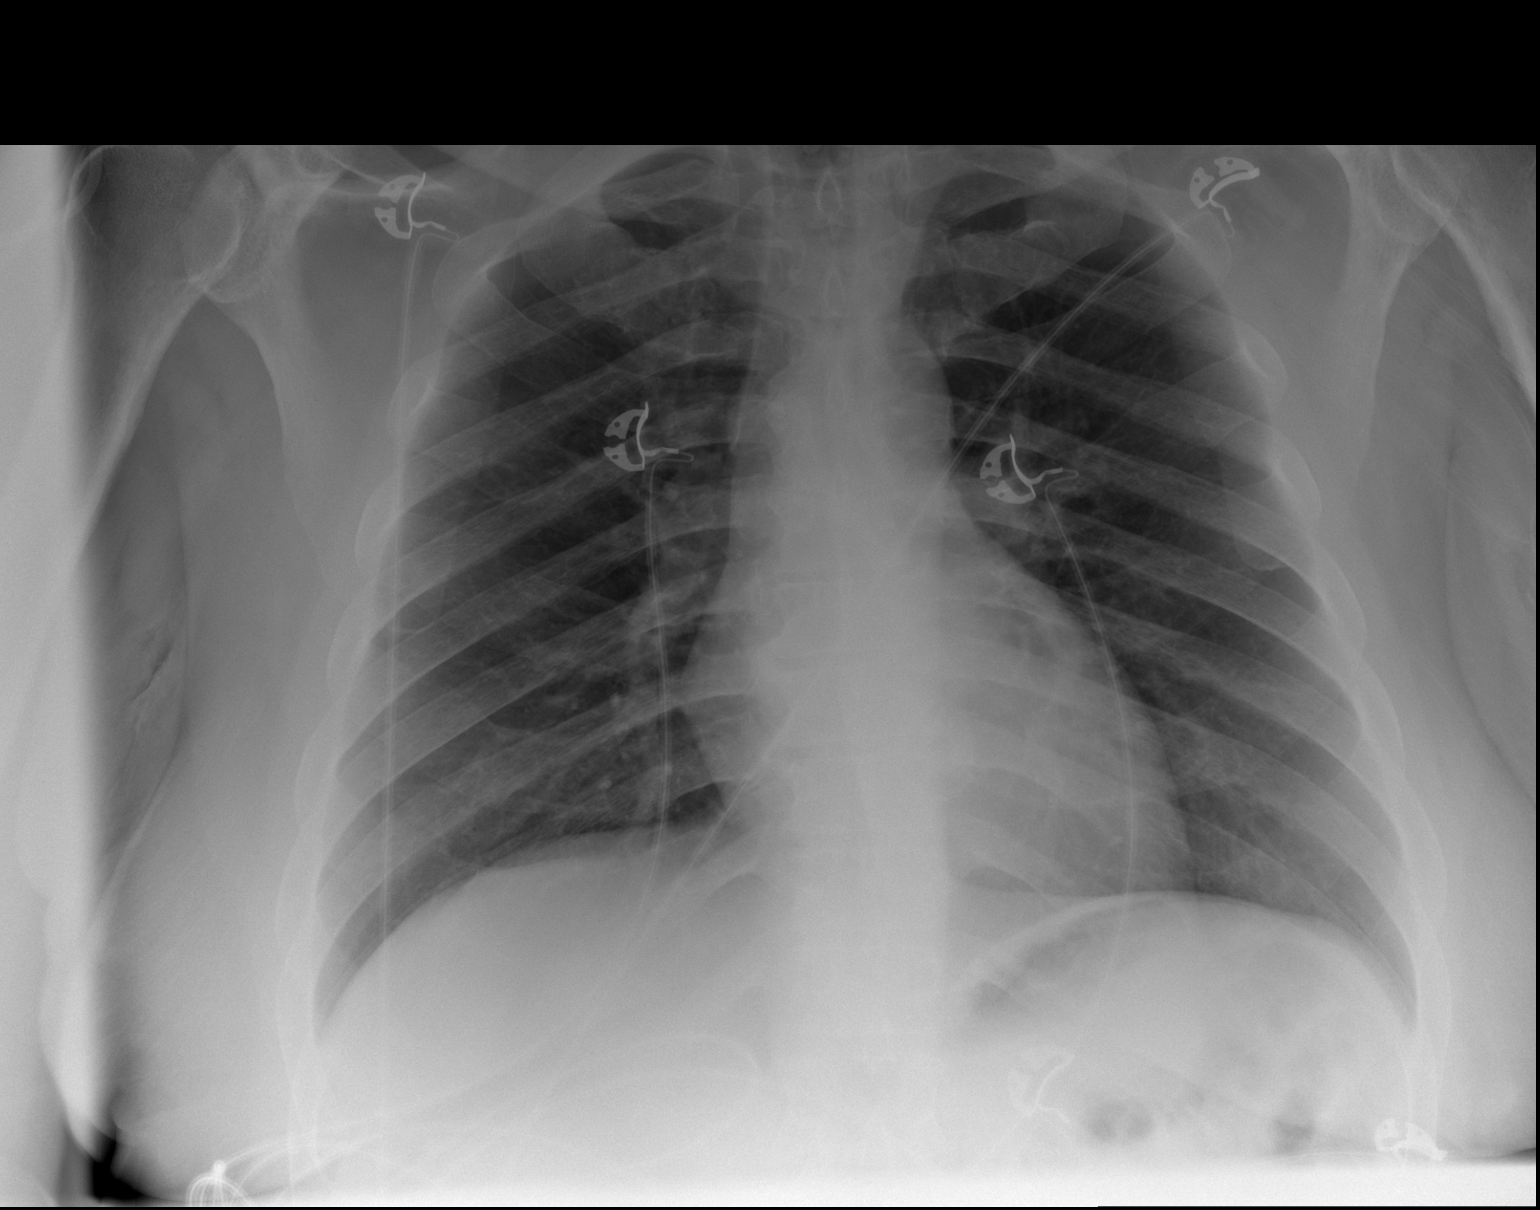

[2 of 2 positions shown; findings below may reference images not displayed]

FINDINGS: EKG leads project over the chest. Cardiomediastinal contours and
hilar structures are normal. Lungs are clear. No sign of effusion.
No visible pneumothorax.

On limited assessment there is no acute skeletal process.
IMPRESSION: No acute cardiopulmonary disease.

## 2023-06-06 ENCOUNTER — Emergency Department (HOSPITAL_COMMUNITY): Payer: 59

## 2023-06-06 ENCOUNTER — Other Ambulatory Visit: Payer: Self-pay

## 2023-06-06 ENCOUNTER — Emergency Department (HOSPITAL_COMMUNITY)
Admission: EM | Admit: 2023-06-06 | Discharge: 2023-06-06 | Discharge status: Against Medical Advice | Payer: 59 | Attending: Emergency Medicine | Admitting: Emergency Medicine

## 2023-06-06 ENCOUNTER — Encounter (HOSPITAL_COMMUNITY): Payer: Self-pay

## 2023-06-06 DIAGNOSIS — Z5321 Procedure and treatment not carried out due to patient leaving prior to being seen by health care provider: Secondary | ICD-10-CM | POA: Insufficient documentation

## 2023-06-06 DIAGNOSIS — R55 Syncope and collapse: Secondary | ICD-10-CM | POA: Insufficient documentation

## 2023-06-06 DIAGNOSIS — Z8673 Personal history of transient ischemic attack (TIA), and cerebral infarction without residual deficits: Secondary | ICD-10-CM | POA: Diagnosis not present

## 2023-06-06 DIAGNOSIS — R519 Headache, unspecified: Secondary | ICD-10-CM | POA: Diagnosis present

## 2023-06-06 DIAGNOSIS — K3184 Gastroparesis: Secondary | ICD-10-CM | POA: Insufficient documentation

## 2023-06-06 DIAGNOSIS — E1165 Type 2 diabetes mellitus with hyperglycemia: Secondary | ICD-10-CM | POA: Diagnosis not present

## 2023-06-06 DIAGNOSIS — R112 Nausea with vomiting, unspecified: Secondary | ICD-10-CM | POA: Diagnosis not present

## 2023-06-06 DIAGNOSIS — E1143 Type 2 diabetes mellitus with diabetic autonomic (poly)neuropathy: Secondary | ICD-10-CM | POA: Insufficient documentation

## 2023-06-06 LAB — CBC WITH DIFFERENTIAL/PLATELET
Abs Immature Granulocytes: 0.02 10*3/uL (ref 0.00–0.07)
Basophils Absolute: 0 10*3/uL (ref 0.0–0.1)
Basophils Relative: 1 %
Eosinophils Absolute: 0.1 10*3/uL (ref 0.0–0.5)
Eosinophils Relative: 1 %
HCT: 43.6 % (ref 39.0–52.0)
Hemoglobin: 14.5 g/dL (ref 13.0–17.0)
Immature Granulocytes: 0 %
Lymphocytes Relative: 41 %
Lymphs Abs: 2.3 10*3/uL (ref 0.7–4.0)
MCH: 30.1 pg (ref 26.0–34.0)
MCHC: 33.3 g/dL (ref 30.0–36.0)
MCV: 90.6 fL (ref 80.0–100.0)
Monocytes Absolute: 0.6 10*3/uL (ref 0.1–1.0)
Monocytes Relative: 11 %
Neutro Abs: 2.7 10*3/uL (ref 1.7–7.7)
Neutrophils Relative %: 46 %
Platelets: 241 10*3/uL (ref 150–400)
RBC: 4.81 MIL/uL (ref 4.22–5.81)
RDW: 12 % (ref 11.5–15.5)
WBC: 5.7 10*3/uL (ref 4.0–10.5)
nRBC: 0 % (ref 0.0–0.2)

## 2023-06-06 LAB — COMPREHENSIVE METABOLIC PANEL
ALT: 21 U/L (ref 0–44)
AST: 18 U/L (ref 15–41)
Albumin: 3.9 g/dL (ref 3.5–5.0)
Alkaline Phosphatase: 37 U/L — ABNORMAL LOW (ref 38–126)
Anion gap: 7 (ref 5–15)
BUN: 8 mg/dL (ref 6–20)
CO2: 27 mmol/L (ref 22–32)
Calcium: 8.8 mg/dL — ABNORMAL LOW (ref 8.9–10.3)
Chloride: 103 mmol/L (ref 98–111)
Creatinine, Ser: 0.95 mg/dL (ref 0.61–1.24)
GFR, Estimated: >60 mL/min (ref >60)
Glucose, Bld: 150 mg/dL — ABNORMAL HIGH (ref 70–99)
Potassium: 3.7 mmol/L (ref 3.5–5.1)
Sodium: 137 mmol/L (ref 135–145)
Total Bilirubin: 0.9 mg/dL (ref 0.0–1.2)
Total Protein: 6.6 g/dL (ref 6.5–8.1)

## 2023-06-06 LAB — LIPASE, BLOOD: Lipase: 30 U/L (ref 11–51)

## 2023-06-06 LAB — CBG MONITORING, ED: Glucose-Capillary: 128 mg/dL — ABNORMAL HIGH (ref 70–99)

## 2023-06-06 NOTE — ED Provider Triage Note (Signed)
Emergency Medicine Provider Triage Evaluation Note  Carlos [name] , a 47 y.o. male  was evaluated in triage.  Pt complains of fall. Hx of stroke, DM, gastroparesis.  Has had nausea and vomiting for nearly a week, minimal BMs from not eating much.  Having frontal headache since yesterday and today while leaning over to tie his shoe he had a syncopal episode, striking head against ground.  No fever, chills, cp, sob, heart palpitation. Not on blood thinner  Review of Systems  Positive: As above Negative: As above  Physical Exam  BP 138/84 (BP Location: Left Arm)   Pulse 97   Temp 98.1 F (36.7 C) (Oral)   Resp 16   Ht 5' 9.5" (1.765 m)   Wt 101.2 kg   SpO2 100%   BMI 32.46 kg/m  Gen:   Awake, no distress   Resp:  Normal effort  MSK:   Moves extremities without difficulty  Other:    Medical Decision Making  Medically screening exam initiated at 3:54 PM.  Appropriate orders placed.  Dyson Seikichi Coriano was informed that the remainder of the evaluation will be completed by another provider, this initial triage assessment does not replace that evaluation, and the importance of remaining in the ED until their evaluation is complete.     Fayrene Helper, PA-C 06/06/23 1556

## 2023-06-06 NOTE — ED Triage Notes (Signed)
Pt had a LOC episode yesterday, was referred to he hospital by his PCP. Pt has hx of strokes in the past. Endorses a headache that began last week and has continued through to today.

## 2023-08-11 ENCOUNTER — Encounter: Payer: Self-pay | Admitting: Orthopedic Surgery

## 2023-08-11 ENCOUNTER — Ambulatory Visit (INDEPENDENT_AMBULATORY_CARE_PROVIDER_SITE_OTHER): Payer: Medicaid Other | Admitting: Orthopedic Surgery

## 2023-08-11 DIAGNOSIS — Z89511 Acquired absence of right leg below knee: Secondary | ICD-10-CM

## 2023-08-11 DIAGNOSIS — S88111S Complete traumatic amputation at level between knee and ankle, right lower leg, sequela: Secondary | ICD-10-CM

## 2023-08-11 NOTE — Progress Notes (Signed)
 Office Visit Note   Patient: Carlos [name]           Date of Birth: 07/17/1975           MRN: [id number] Visit Date: 08/11/2023              Requested by: [facility] [address] [place],  [place] [address] PCP: [facility]  Chief Complaint  Patient presents with   Other    History of right BKA-needing rx for new prosthesis      HPI: Patient is a 49 year old gentleman status post right below-knee amputation.  Patient wishes to return to running and kickboxing.  He states he has been unable to run with the current foot and ankle on his prosthetic limb.  Assessment & Plan: Visit Diagnoses:  1. 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  Patient wishes to return to K for level activity.  Follow-Up Instructions: Return if symptoms worsen or fail to improve.   Ortho Exam  Patient is alert, oriented, no adenopathy, well-dressed, normal affect, normal respiratory effort. Examination patient has a well-healed residual limb he ambulates with an antalgic gait due to the stiffness of the foot and ankle.  Patient is most recently cholecystectomy January 24  Patient is an existing right transtibial  amputee.  Patient's current comorbidities are not expected to impact the ability to function with the prescribed prosthesis. Patient verbally communicates a strong desire to use a prosthesis. Patient currently requires mobility aids to ambulate without a prosthesis.  Expects not to use mobility aids with a new prosthesis. Patient is expected to resume or reach their K Level within 6 months. Patient was active before the amputation and independent with stairs, uneven terrain, varying cadence, and a community ambulator.  Patient has tried to resume his running and kickboxing and is unable to perform his previous activities with the current  foot and ankle.  Patient will need a upgraded foot and ankle to continue his normal activities.     Imaging: No results found. No images are attached to the encounter.  Labs: Lab Results  Component Value Date   HGBA1C 10.4 (H) 10/08/2022   CRP 4.1 (H) 10/18/2022   REPTSTATUS 10/18/2022 FINAL 10/13/2022   GRAMSTAIN  10/13/2022    RARE WBC PRESENT, PREDOMINANTLY MONONUCLEAR NO ORGANISMS SEEN    CULT  10/13/2022    No growth aerobically or anaerobically. Performed at [facility], [address] [place], [place] [address]    LABORGA STREPTOCOCCUS MITIS/ORALIS 10/10/2022     Lab Results  Component Value Date   ALBUMIN 3.9 06/06/2023   ALBUMIN 2.3 (L) 10/21/2022   ALBUMIN 1.6 (L) 10/17/2022   PREALBUMIN 9 (L) 10/13/2022    Lab Results  Component Value Date   MG 1.6 (L) 10/21/2022   MG 1.6 (L) 10/20/2022   MG 1.5 (L) 10/19/2022   No results found for: "VD25OH"  Lab Results  Component Value Date   PREALBUMIN 9 (L) 10/13/2022      Latest Ref Rng & Units 06/06/2023    4:02 PM 10/25/2022    5:17 AM 10/21/2022    7:11 AM  CBC EXTENDED  WBC 4.0 - 10.5 K/uL 5.7  4.2  6.1   RBC 4.22 - 5.81 MIL/uL 4.81  3.54  3.21   Hemoglobin 13.0 - 17.0 g/dL  14.5  10.3  9.5   HCT 39.0 - 52.0 % 43.6  32.9  29.9   Platelets 150 - 400 K/uL 241  214  334   NEUT# 1.7 - 7.7 K/uL 2.7  2.1  3.6   Lymph# 0.7 - 4.0 K/uL 2.3  1.3  1.8      There is no height or weight on file to calculate BMI.  Orders:  No orders of the defined types were placed in this encounter.  No orders of the defined types were placed in this encounter.    Procedures: No procedures performed  Clinical Data: No additional findings.  ROS:  All other systems negative, except as noted in the HPI. Review of Systems  Objective: Vital Signs: There were no vitals taken for this visit.  Specialty Comments:  No specialty comments available.  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, right, sequela (HCC) 10/20/2022   Hypomagnesemia 10/20/2022   Anemia of chronic disease 10/20/2022   Gastroesophageal reflux disease 10/20/2022   Necrotizing fasciitis of lower leg (HCC) 10/10/2022   Gangrene of right foot (HCC) 10/09/2022   Cellulitis of right lower extremity 10/09/2022   Diabetic foot ulcer (HCC) 10/08/2022   Diabetes (HCC) 10/08/2022   Severe sepsis (HCC) 10/08/2022   Hyponatremia 10/08/2022   Lactic acidosis 10/08/2022   AKI (acute kidney injury) (HCC) 10/08/2022   Diabetic gastroparesis (HCC) 10/08/2022   Elevated troponin 10/08/2022   H/O: CVA (cerebrovascular accident) 10/08/2022   Past Medical History:  Diagnosis Date   Diabetes mellitus     History reviewed. No pertinent family history.  Past Surgical History:  Procedure Laterality Date   AMPUTATION Right 10/10/2022   Procedure: AMPUTATION BELOW KNEE;  Surgeon: [name], MD;  Location: [facility] OR;  Service: Orthopedics;  Laterality: Right;   STUMP REVISION Right 10/13/2022   Procedure: REVISION RIGHT BELOW KNEE AMPUTATION;  Surgeon: [name], MD;  Location: [facility] OR;  Service: Orthopedics;  Laterality: Right;   STUMP REVISION Right 10/15/2022   Procedure: REVISION RIGHT BELOW KNEE AMPUTATION;  Surgeon: [name], MD;  Location: [facility] OR;  Service: Orthopedics;  Laterality: Right;   TEE WITHOUT CARDIOVERSION N/A 10/14/2022   Procedure: TRANSESOPHAGEAL ECHOCARDIOGRAM;  Surgeon: Quintella Reichert, MD;  Location: [facility] INVASIVE CV LAB;  Service: Cardiovascular;  Laterality: N/A;   Social History   Occupational History   Not on file  Tobacco Use   Smoking status: Never   Smokeless tobacco: Not on file  Vaping Use   Vaping status: Never Used  Substance and Sexual Activity   Alcohol use: Yes    Comment: rarely   Drug use: No   Sexual activity: Not on file

## 2023-09-25 ENCOUNTER — Ambulatory Visit
Admission: EM | Admit: 2023-09-25 | Discharge: 2023-09-25 | Disposition: A | Discharge status: Home / Self Care | Attending: Physician Assistant | Admitting: Physician Assistant

## 2023-09-25 DIAGNOSIS — R112 Nausea with vomiting, unspecified: Secondary | ICD-10-CM | POA: Diagnosis not present

## 2023-09-25 DIAGNOSIS — Z0289 Encounter for other administrative examinations: Secondary | ICD-10-CM | POA: Diagnosis not present

## 2023-09-25 DIAGNOSIS — R030 Elevated blood-pressure reading, without diagnosis of hypertension: Secondary | ICD-10-CM | POA: Diagnosis not present

## 2023-09-25 DIAGNOSIS — R197 Diarrhea, unspecified: Secondary | ICD-10-CM

## 2023-09-25 DIAGNOSIS — A084 Viral intestinal infection, unspecified: Secondary | ICD-10-CM | POA: Diagnosis not present

## 2023-09-25 NOTE — Discharge Instructions (Signed)
 I am glad you are feeling better after recovering from illness.  Continue to push fluids and eat small frequent meals.  If you have any recurrent symptoms including abdominal pain, fever, nausea, vomiting, blood in your stool you should be seen immediately.  Your blood pressure is very elevated.  Please monitor your blood pressure at home and if this is persistently above 140/90 you should be reevaluated to consider medication.  Avoid decongestants, caffeine, sodium, NSAIDs (aspirin, ibuprofen /Advil , naproxen/Aleve).  Follow-up with your PCP as scheduled next week.  If you develop any chest pain, shortness of breath, headache, vision change, dizziness in setting of high blood pressure you need to go to the ER.

## 2023-09-25 NOTE — ED Triage Notes (Signed)
 Pt present for a work note. States he recently had the stomach virus and feels better now but needs to have a note stating he is able to go back to work.

## 2023-09-25 NOTE — ED Provider Notes (Signed)
 [facility]    CSN: [id number] Arrival date & time: 09/25/23  1142      History   Chief Complaint Chief Complaint  Patient presents with   Letter for School/Work    HPI Carlos [name] is a 48 y.o. male.   Patient presents today requesting a note allowing him to return to work.  He reports that last weekend (1 week ago) he developed GI symptoms including nausea, vomiting, diarrhea.  He was seen by his primary care that week and they ruled him out for several weeks.  They discussed that if he is feeling better sooner he could return and they would write him back to work.  Unfortunately, they are unable to see him until next week.  He reports that he has been asymptomatic for 72 hours and has not had any nausea, vomiting, diarrhea in the past few days.  He is eating and drinking normally.  He denies any abdominal pain, fever, melena, hematochezia.  He has had a normal bowel movement in the past 24 hours.  He does have a history of gastroparesis but has not been experiencing symptoms recently.  He has had cholecystectomy but denies additional abdominal surgery.  He is requesting a return to work note for tomorrow.  Patient was noted to have elevated blood pressure reading.  Review of his previous blood pressures in epic indicate that his blood pressure has fluctuated but is not consistently elevated.  He denies formal diagnosis of hypertension and does not take antihypertensive medication.  He denies any recent NSAID, decongestant, caffeine, sodium use.  He denies any chest pain, shortness of breath, headache, vision change, dizziness.  He does have a primary care and has an appoint with him next week.    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, right, sequela (HCC)  10/20/2022   Hypomagnesemia 10/20/2022   Anemia of chronic disease 10/20/2022   Gastroesophageal reflux disease 10/20/2022   Necrotizing fasciitis of lower leg (HCC) 10/10/2022   Gangrene of right foot (HCC) 10/09/2022   Cellulitis of right lower extremity 10/09/2022   Diabetic foot ulcer (HCC) 10/08/2022   Diabetes (HCC) 10/08/2022   Severe sepsis (HCC) 10/08/2022   Hyponatremia 10/08/2022   Lactic acidosis 10/08/2022   AKI (acute kidney injury) (HCC) 10/08/2022   Diabetic gastroparesis (HCC) 10/08/2022   Elevated troponin 10/08/2022   H/O: CVA (cerebrovascular accident) 10/08/2022    Past Surgical History:  Procedure Laterality Date   AMPUTATION Right 10/10/2022   Procedure: AMPUTATION BELOW KNEE;  Surgeon: Timothy Ford, MD;  Location: [facility] OR;  Service: Orthopedics;  Laterality: Right;   STUMP REVISION Right 10/13/2022   Procedure: REVISION RIGHT BELOW KNEE AMPUTATION;  Surgeon: Timothy Ford, MD;  Location: [facility] OR;  Service: Orthopedics;  Laterality: Right;   STUMP REVISION Right 10/15/2022   Procedure: REVISION RIGHT BELOW KNEE AMPUTATION;  Surgeon: Timothy Ford, MD;  Location: [facility] OR;  Service: Orthopedics;  Laterality: Right;   TEE WITHOUT CARDIOVERSION N/A 10/14/2022   Procedure: TRANSESOPHAGEAL ECHOCARDIOGRAM;  Surgeon: Jacqueline Matsu, MD;  Location: [facility] INVASIVE CV LAB;  Service: Cardiovascular;  Laterality: N/A;       Home Medications    Prior to Admission medications   Medication Sig Start Date End Date Taking? Authorizing  Provider  ascorbic acid  (VITAMIN C ) 1000 MG tablet Take 1 tablet (1,000 mg total) by mouth daily. 10/26/22   Love, Renay Carota, PA-C  magnesium  oxide (MAG-OX) 400 MG tablet Take 1 tablet (400 mg total) by mouth 2 (two) times daily. 10/26/22   Zelda Hickman, PA-C    Family History History reviewed. No pertinent family history.  Social History Social History   Tobacco Use   Smoking status: Never  Vaping Use   Vaping status: Never Used  Substance  Use Topics   Alcohol use: Yes    Comment: rarely   Drug use: No     Allergies   Patient has no known allergies.   Review of Systems Review of Systems  Constitutional:  Negative for activity change, appetite change, fatigue and fever.  Eyes:  Negative for visual disturbance.  Respiratory:  Negative for shortness of breath.   Cardiovascular:  Negative for chest pain, palpitations and leg swelling.  Gastrointestinal:  Negative for abdominal pain, blood in stool, diarrhea, nausea and vomiting.  Neurological:  Negative for dizziness, light-headedness and headaches.     Physical Exam Triage Vital Signs ED Triage Vitals [09/25/23 1216]  Encounter Vitals Group     BP (!) 195/105     Systolic BP Percentile      Diastolic BP Percentile      Pulse Rate 89     Resp 16     Temp 98.9 F (37.2 C)     Temp Source Oral     SpO2 96 %     Weight      Height      Head Circumference      Peak Flow      Pain Score 0     Pain Loc      Pain Education      Exclude from Growth Chart    No data found.  Updated Vital Signs BP (!) 188/92 (BP Location: Right Arm)   Pulse 89   Temp 98.9 F (37.2 C) (Oral)   Resp 16   SpO2 96%   Visual Acuity Right Eye Distance:   Left Eye Distance:   Bilateral Distance:    Right Eye Near:   Left Eye Near:    Bilateral Near:     Physical Exam Vitals reviewed.  Constitutional:      General: He is awake.     Appearance: Normal appearance. He is well-developed. He is not ill-appearing.     Comments: Very pleasant male appears stated age no acute distress sitting comfortably in exam room  HENT:     Head: Normocephalic and atraumatic.     Mouth/Throat:     Mouth: Mucous membranes are moist.     Pharynx: Uvula midline. No oropharyngeal exudate, posterior oropharyngeal erythema or uvula swelling.  Cardiovascular:     Rate and Rhythm: Normal rate and regular rhythm.     Heart sounds: Normal heart sounds, S1 normal and S2 normal. No murmur  heard. Pulmonary:     Effort: Pulmonary effort is normal.     Breath sounds: Normal breath sounds. No stridor. No wheezing, rhonchi or rales.     Comments: Clear to auscultation bilaterally Abdominal:     General: Bowel sounds are normal.     Palpations: Abdomen is soft.     Tenderness: There is no abdominal tenderness. There is no right CVA tenderness, left CVA tenderness, guarding or rebound.     Comments: Benign abdominal exam  Neurological:  Mental Status: He is alert.  Psychiatric:        Behavior: Behavior is cooperative.      UC Treatments / Results  Labs (all labs ordered are listed, but only abnormal results are displayed) Labs Reviewed - No data to display  EKG   Radiology No results found.  Procedures Procedures (including critical care time)  Medications Ordered in UC Medications - No data to display  Initial Impression / Assessment and Plan / UC Course  I have reviewed the triage vital signs and the nursing notes.  Pertinent labs & imaging results that were available during my care of the patient were reviewed by me and considered in my medical decision making (see chart for details).     Patient is well-appearing, afebrile, nontoxic, nontachycardic.  No indication for emergent evaluation or imaging based on physical exam today.  We discussed that his GI symptoms are likely viral in nature given short duration.  He has been without any ongoing symptoms for over 48 hours and so it is safe for him to return to work.  I recommend that he continue eating a bland diet and pushing fluids.  He was provided a work excuse note allowing him to return tomorrow.  Discussed that if he has any recurrent symptoms he should return for reevaluation.  His blood pressure is very elevated.  He denies history of hypertension.  He denies any signs/symptoms of endorgan damage.  Recommended that he avoid decongestants, caffeine, sodium, NSAIDs.  He does have a blood pressure  monitor at home and was encouraged to monitor this regularly and if persistently above 140/90 follow-up with his PCP or return here to consider initiation of medication.  He does have a follow-up with his PCP scheduled next week, strongly encouraged to keep this appointment.  We discussed that if he develops any chest pain, shortness of breath, headache, vision change, dizziness in setting of high blood pressure he needs to be seen immediately.  Strict return precautions given.  Final Clinical Impressions(s) / UC Diagnoses   Final diagnoses:  Encounter to obtain excuse from work  Viral gastroenteritis  Nausea vomiting and diarrhea  Elevated blood pressure reading without diagnosis of hypertension     Discharge Instructions      I am glad you are feeling better after recovering from illness.  Continue to push fluids and eat small frequent meals.  If you have any recurrent symptoms including abdominal pain, fever, nausea, vomiting, blood in your stool you should be seen immediately.  Your blood pressure is very elevated.  Please monitor your blood pressure at home and if this is persistently above 140/90 you should be reevaluated to consider medication.  Avoid decongestants, caffeine, sodium, NSAIDs (aspirin, ibuprofen /Advil , naproxen/Aleve).  Follow-up with your PCP as scheduled next week.  If you develop any chest pain, shortness of breath, headache, vision change, dizziness in setting of high blood pressure you need to go to the ER.     ED Prescriptions   None    PDMP not reviewed this encounter.   Budd Cargo, PA-C 09/25/23 1257

## 2023-10-05 ENCOUNTER — Other Ambulatory Visit: Payer: Self-pay | Admitting: Urology

## 2024-01-10 NOTE — Progress Notes (Addendum)
 Anesthesia Review:  PCP: Christopher Bohr LOV 11/10/23  Cardiologist : none   PPM/ ICD: Device Orders: Rep Notified:  Chest x-ray : EKG : 01/13/24  Echo : 10/14/22  Stress test: Cardiac Cath :   Activity level: can do a flight of stairs without difficutly  Sleep Study/ CPAP : none  Fasting Blood Sugar :      / Checks Blood Sugar -- times a day:     DM- type2- on no meds checks glucose on occasion  Hgba1c-  01/13/24- 8.5 routed to DR machen on 01/16/2024   Blood Thinner/ Instructions /Last Dose: ASA / Instructions/ Last Dose :    Pt wears contacts.  Right leg prosthesis   Vancomycin     AT preop appt on 01/13/24 blood pressure was 167/93 in right arm and in left arm was 171/95.  PST denies any

## 2024-01-10 NOTE — Patient Instructions (Signed)
 SURGICAL WAITING ROOM VISITATION  Patients having surgery or a procedure may have no more than 2 support people in the waiting area - these visitors may rotate.    Children under the age of 45 must have an adult with them who is not the patient.  Visitors with respiratory illnesses are discouraged from visiting and should remain at home.  If the patient needs to stay at the hospital during part of their recovery, the visitor guidelines for inpatient rooms apply. Pre-op nurse will coordinate an appropriate time for 1 support person to accompany patient in pre-op.  This support person may not rotate.    Please refer to the [facility] website for the visitor guidelines for Inpatients (after your surgery is over and you are in a regular room).       Your procedure is scheduled on:  01/17/24    Report to [facility] Main Entrance    Report to admitting at     [phone]   Call this number if you have problems the morning of surgery [phone]   Do not eat food  or drink liquids :After Midnight.                 If you have questions, please contact your surgeon's office.      Oral Hygiene is also important to reduce your risk of infection.                                    Remember - BRUSH YOUR TEETH THE MORNING OF SURGERY WITH YOUR REGULAR TOOTHPASTE  DENTURES WILL BE REMOVED PRIOR TO SURGERY PLEASE DO NOT APPLY Poly grip OR ADHESIVES!!!   Do NOT smoke after Midnight   Stop all vitamins and herbal supplements 7 days before surgery.   Take these medicines the morning of surgery with A SIP OF WATER:   DO NOT TAKE ANY ORAL DIABETIC MEDICATIONS DAY OF YOUR SURGERY  Bring CPAP mask and tubing day of surgery.                              You may not have any metal on your body including hair pins, jewelry, and body piercing             Do not wear make-up, lotions, powders, perfumes/cologne, or deodorant  Do not wear nail polish including gel and S&S,  artificial/acrylic nails, or any other type of covering on natural nails including finger and toenails. If you have artificial nails, gel coating, etc. that needs to be removed by a nail salon please have this removed prior to surgery or surgery may need to be canceled/ delayed if the surgeon/ anesthesia feels like they are unable to be safely monitored.   Do not shave  48 hours prior to surgery.               Men may shave face and neck.   Do not bring valuables to the hospital. [facility] IS NOT             RESPONSIBLE   FOR VALUABLES.   Contacts, glasses, dentures or bridgework may not be worn into surgery.   Bring small overnight bag day of surgery.   DO NOT BRING YOUR HOME MEDICATIONS TO THE HOSPITAL. PHARMACY WILL DISPENSE MEDICATIONS LISTED ON YOUR MEDICATION LIST TO YOU DURING YOUR ADMISSION  IN THE HOSPITAL!    Patients discharged on the day of surgery will not be allowed to drive home.  Someone NEEDS to stay with you for the first 24 hours after anesthesia.   Special Instructions: Bring a copy of your healthcare power of attorney and living will documents the day of surgery if you haven't scanned them before.              Please read over the following fact sheets you were given: IF YOU HAVE QUESTIONS ABOUT YOUR PRE-OP INSTRUCTIONS PLEASE CALL [phone].   If you received a COVID test during your pre-op visit  it is requested that you wear a mask when out in public, stay away from anyone that may not be feeling well and notify your surgeon if you develop symptoms. If you test positive for Covid or have been in contact with anyone that has tested positive in the last 10 days please notify you surgeon.    [facility] - Preparing for Surgery Before surgery, you can play an important role.  Because skin is not sterile, your skin needs to be as free of germs as possible.  You can reduce the number of germs on your skin by washing with CHG (chlorahexidine gluconate) soap before surgery.   CHG is an antiseptic cleaner which kills germs and bonds with the skin to continue killing germs even after washing. Please DO NOT use if you have an allergy to CHG or antibacterial soaps.  If your skin becomes reddened/irritated stop using the CHG and inform your nurse when you arrive at Short Stay. Do not shave (including legs and underarms) for at least 48 hours prior to the first CHG shower.  You may shave your face/neck. Please follow these instructions carefully:  1.  Shower with CHG Soap the night before surgery and the  morning of Surgery.  2.  If you choose to wash your hair, wash your hair first as usual with your  normal  shampoo.  3.  After you shampoo, rinse your hair and body thoroughly to remove the  shampoo.                           4.  Use CHG as you would any other liquid soap.  You can apply chg directly  to the skin and wash                       Gently with a scrungie or clean washcloth.  5.  Apply the CHG Soap to your body ONLY FROM THE NECK DOWN.   Do not use on face/ open                           Wound or open sores. Avoid contact with eyes, ears mouth and genitals (private parts).                       Wash face,  Genitals (private parts) with your normal soap.             6.  Wash thoroughly, paying special attention to the area where your surgery  will be performed.  7.  Thoroughly rinse your body with warm water from the neck down.  8.  DO NOT shower/wash with your normal soap after using and rinsing off  the CHG Soap.  9.  Pat yourself dry with a clean towel.            10.  Wear clean pajamas.            11.  Place clean sheets on your bed the night of your first shower and do not  sleep with pets. Day of Surgery : Do not apply any lotions/deodorants the morning of surgery.  Please wear clean clothes to the hospital/surgery center.  FAILURE TO FOLLOW THESE INSTRUCTIONS MAY RESULT IN THE CANCELLATION OF YOUR SURGERY PATIENT  SIGNATURE_________________________________  NURSE SIGNATURE__________________________________  ________________________________________________________________________

## 2024-01-13 ENCOUNTER — Encounter (HOSPITAL_COMMUNITY): Payer: Self-pay

## 2024-01-13 ENCOUNTER — Encounter (HOSPITAL_COMMUNITY)
Admission: RE | Admit: 2024-01-13 | Discharge: 2024-01-13 | Disposition: A | Discharge status: Home / Self Care | Source: Ambulatory Visit | Attending: Urology | Admitting: Urology

## 2024-01-13 ENCOUNTER — Other Ambulatory Visit: Payer: Self-pay

## 2024-01-13 VITALS — BP 167/93 | HR 83 | Temp 99.2°F | Resp 16 | Ht 69.0 in | Wt 223.0 lb

## 2024-01-13 DIAGNOSIS — Z01818 Encounter for other preprocedural examination: Secondary | ICD-10-CM | POA: Diagnosis present

## 2024-01-13 LAB — CBC
HCT: 46.6 % (ref 39.0–52.0)
Hemoglobin: 14.9 g/dL (ref 13.0–17.0)
MCH: 29.6 pg (ref 26.0–34.0)
MCHC: 32 g/dL (ref 30.0–36.0)
MCV: 92.5 fL (ref 80.0–100.0)
Platelets: 222 K/uL (ref 150–400)
RBC: 5.04 MIL/uL (ref 4.22–5.81)
RDW: 12.1 % (ref 11.5–15.5)
WBC: 7.2 K/uL (ref 4.0–10.5)
nRBC: 0 % (ref 0.0–0.2)

## 2024-01-13 LAB — BASIC METABOLIC PANEL WITH GFR
Anion gap: 13 (ref 5–15)
BUN: 16 mg/dL (ref 6–20)
CO2: 27 mmol/L (ref 22–32)
Calcium: 9.4 mg/dL (ref 8.9–10.3)
Chloride: 101 mmol/L (ref 98–111)
Creatinine, Ser: 1.05 mg/dL (ref 0.61–1.24)
GFR, Estimated: >60 mL/min (ref >60)
Glucose, Bld: 144 mg/dL — ABNORMAL HIGH (ref 70–99)
Potassium: 4.4 mmol/L (ref 3.5–5.1)
Sodium: 141 mmol/L (ref 135–145)

## 2024-01-13 LAB — GLUCOSE, CAPILLARY: Glucose-Capillary: 136 mg/dL — ABNORMAL HIGH (ref 70–99)

## 2024-01-14 LAB — HEMOGLOBIN A1C
Hgb A1c MFr Bld: 8.5 % — ABNORMAL HIGH (ref 4.8–5.6)
Mean Plasma Glucose: 197 mg/dL

## 2024-01-14 LAB — URINE CULTURE: Culture: NO GROWTH

## 2024-01-16 ENCOUNTER — Encounter (HOSPITAL_COMMUNITY): Payer: Self-pay | Admitting: Anesthesiology

## 2024-01-16 MED ORDER — GENTAMICIN SULFATE 40 MG/ML IJ SOLN
5.0000 mg/kg | INTRAVENOUS | Status: DC
  Filled 2024-01-16: qty 10.25

## 2024-01-17 ENCOUNTER — Encounter (HOSPITAL_COMMUNITY): Admission: RE | Payer: Self-pay | Source: Home / Self Care

## 2024-01-17 ENCOUNTER — Encounter (HOSPITAL_COMMUNITY): Payer: Self-pay | Admitting: Anesthesiology

## 2024-01-17 ENCOUNTER — Ambulatory Visit (HOSPITAL_COMMUNITY): Admission: RE | Admit: 2024-01-17 | Source: Home / Self Care | Admitting: Urology

## 2024-01-17 SURGERY — INSERTION, PENILE PROSTHESIS
Anesthesia: General

## 2024-02-14 ENCOUNTER — Ambulatory Visit
Admission: EM | Admit: 2024-02-14 | Discharge: 2024-02-14 | Disposition: A | Discharge status: Home / Self Care | Source: Ambulatory Visit | Attending: Family Medicine | Admitting: Family Medicine

## 2024-02-14 DIAGNOSIS — R112 Nausea with vomiting, unspecified: Secondary | ICD-10-CM | POA: Diagnosis not present

## 2024-02-14 DIAGNOSIS — R197 Diarrhea, unspecified: Secondary | ICD-10-CM | POA: Diagnosis not present

## 2024-02-14 DIAGNOSIS — E860 Dehydration: Secondary | ICD-10-CM

## 2024-02-14 DIAGNOSIS — A084 Viral intestinal infection, unspecified: Secondary | ICD-10-CM

## 2024-02-14 HISTORY — DX: Gastroparesis: K31.84

## 2024-02-14 LAB — POCT URINE DIPSTICK
Glucose, UA: NEGATIVE mg/dL
Leukocytes, UA: NEGATIVE
Nitrite, UA: NEGATIVE
POC PROTEIN,UA: 100 — AB
Spec Grav, UA: >=1.03 — AB (ref 1.010–1.025)
Urobilinogen, UA: 1 U/dL
pH, UA: 5.5 (ref 5.0–8.0)

## 2024-02-14 MED ORDER — LOPERAMIDE HCL 2 MG PO CAPS: 2.0000 mg | ORAL_CAPSULE | Freq: Two times a day (BID) | ORAL | 0 refills | Status: AC | PRN

## 2024-02-14 MED ORDER — SODIUM CHLORIDE 0.9 % IV BOLUS
1000.0000 mL | Freq: Once | INTRAVENOUS | Status: AC
  Administered 2024-02-14: 1000 mL via INTRAVENOUS

## 2024-02-14 MED ORDER — ONDANSETRON 8 MG PO TBDP: 8.0000 mg | ORAL_TABLET | Freq: Three times a day (TID) | ORAL | 0 refills | Status: AC | PRN

## 2024-02-14 NOTE — ED Provider Notes (Signed)
 [facility] - URGENT CARE CENTER  Note:  This document was prepared using Conservation officer, historic buildings and may include unintentional dictation errors.  MRN: [id number] DOB: 09-25-1975  Subjective:   Carlos [name] is a 49 y.o. male presenting for 3 days of nausea, vomiting, diarrhea.  Believes it may have been food poisoning as he ate out before symptoms started.  No bloody stools.  No fever, recent antibiotic use, hospitalizations or long distance travel.  Has not eaten raw foods, drank unfiltered water.  No history of GI disorders including Crohn's, IBS, ulcerative colitis.  Last hemoglobin A1c was 8.5% from 01/13/2024. Patient is improving but still has nausea. No respiratory symptoms.    No current facility-administered medications for this encounter.  Current Outpatient Medications:    ibuprofen  (ADVIL ) 200 MG tablet, Take 400 mg by mouth every 8 (eight) hours as needed (headache/pain.)., Disp: , Rfl:    No Known Allergies  Past Medical History:  Diagnosis Date   Diabetes mellitus    Gastroparesis    per pt     Past Surgical History:  Procedure Laterality Date   AMPUTATION Right 10/10/2022   Procedure: AMPUTATION BELOW KNEE;  Surgeon: Harden Jerona GAILS, MD;  Location: [facility] OR;  Service: Orthopedics;  Laterality: Right;   CHOLECYSTECTOMY     06/2023   STUMP REVISION Right 10/13/2022   Procedure: REVISION RIGHT BELOW KNEE AMPUTATION;  Surgeon: Harden Jerona GAILS, MD;  Location: [facility] OR;  Service: Orthopedics;  Laterality: Right;   STUMP REVISION Right 10/15/2022   Procedure: REVISION RIGHT BELOW KNEE AMPUTATION;  Surgeon: Harden Jerona GAILS, MD;  Location: [facility] OR;  Service: Orthopedics;  Laterality: Right;   TEE WITHOUT CARDIOVERSION N/A 10/14/2022   Procedure: TRANSESOPHAGEAL ECHOCARDIOGRAM;  Surgeon: Shlomo Wilbert SAUNDERS, MD;  Location: [facility] INVASIVE CV LAB;  Service: Cardiovascular;  Laterality: N/A;    No family history on file.  Social History   Tobacco Use   Smoking status:  Never   Smokeless tobacco: Never  Vaping Use   Vaping status: Never Used  Substance Use Topics   Alcohol use: Yes    Comment: occ   Drug use: No    ROS   Objective:   Vitals: BP (!) 146/95 (BP Location: Right Arm)   Pulse (!) 107   Temp 98.5 F (36.9 C) (Oral)   Resp 20   SpO2 94%   Physical Exam Constitutional:      General: He is not in acute distress.    Appearance: Normal appearance. He is well-developed and normal weight. He is not ill-appearing, toxic-appearing or diaphoretic.  HENT:     Right Ear: External ear normal.     Left Ear: External ear normal.     Nose: Nose normal.     Mouth/Throat:     Mouth: Mucous membranes are moist.  Eyes:     General: No scleral icterus.       Right eye: No discharge.        Left eye: No discharge.     Extraocular Movements: Extraocular movements intact.     Conjunctiva/sclera: Conjunctivae normal.  Cardiovascular:     Rate and Rhythm: Normal rate and regular rhythm.     Heart sounds: No murmur heard.    No friction rub. No gallop.  Pulmonary:     Effort: No respiratory distress.     Breath sounds: No wheezing or rales.  Abdominal:     General: Bowel sounds are increased. There is no distension.  Palpations: Abdomen is soft. There is no mass.     Tenderness: There is abdominal tenderness. There is no right CVA tenderness, left CVA tenderness, guarding or rebound.  Skin:    General: Skin is warm and dry.  Neurological:     Mental Status: He is alert and oriented to person, place, and time.    Results for orders placed or performed during the hospital encounter of 02/14/24 (from the past 24 hours)  POCT URINE DIPSTICK     Status: Abnormal   Collection Time: 02/14/24  3:37 PM  Result Value Ref Range   Color, UA yellow yellow   Clarity, UA clear clear   Glucose, UA negative negative mg/dL   Bilirubin, UA moderate (A) negative   Ketones, POC UA moderate (40) (A) negative mg/dL   Spec Grav, UA >=8.969 (A) 1.010 -  1.025   Blood, UA trace-intact (A) negative   pH, UA 5.5 5.0 - 8.0   POC PROTEIN,UA =100 (A) negative, trace   Urobilinogen, UA 1.0 0.2 or 1.0 E.U./dL   Nitrite, UA Negative Negative   Leukocytes, UA Negative Negative   IV fluid bolus administered at 1,000cc over a period of 50 minutes.   Assessment and Plan :   PDMP not reviewed this encounter.  1. Viral gastroenteritis   2. Nausea vomiting and diarrhea    Will manage for suspected viral gastroenteritis with supportive care.  Recommended patient hydrate well, eat light meals and maintain electrolytes.  Will use Zofran  and Imodium  for nausea, vomiting and diarrhea. Counseled patient on potential for adverse effects with medications prescribed/recommended today, ER and return-to-clinic precautions discussed, patient verbalized understanding.    Christopher Savannah, [place] 02/14/24 [id number]

## 2024-02-14 NOTE — ED Triage Notes (Addendum)
 Pt c/o n/v/d x 3 days-no meds PTA-requesting RTW note-NAD-steady gait

## 2024-02-14 NOTE — Discharge Instructions (Addendum)

## 2024-04-09 ENCOUNTER — Encounter: Payer: Self-pay | Admitting: Radiology

## 2024-07-25 ENCOUNTER — Ambulatory Visit (HOSPITAL_BASED_OUTPATIENT_CLINIC_OR_DEPARTMENT_OTHER): Admit: 2024-07-25 | Admitting: Urology

## 2024-07-25 ENCOUNTER — Encounter (HOSPITAL_BASED_OUTPATIENT_CLINIC_OR_DEPARTMENT_OTHER): Payer: Self-pay

## 2024-07-25 SURGERY — INSERTION, PENILE PROSTHESIS, INFLATABLE
Anesthesia: General
